# Patient Record
Sex: Female | Born: 1955 | Race: White | Hispanic: No | Marital: Married | Smoking: Never smoker
Health system: Southern US, Community
[De-identification: ages and names within clinical notes are randomized; demographics above are authoritative.]

## PROBLEM LIST (undated history)

## (undated) DIAGNOSIS — I639 Cerebral infarction, unspecified: Secondary | ICD-10-CM

## (undated) DIAGNOSIS — G43909 Migraine, unspecified, not intractable, without status migrainosus: Secondary | ICD-10-CM

## (undated) DIAGNOSIS — I10 Essential (primary) hypertension: Secondary | ICD-10-CM

## (undated) DIAGNOSIS — M797 Fibromyalgia: Secondary | ICD-10-CM

## (undated) HISTORY — DX: Cerebral infarction, unspecified: I63.9

## (undated) HISTORY — PX: ABDOMINAL HYSTERECTOMY: SHX81

## (undated) HISTORY — PX: CHOLECYSTECTOMY: SHX55

## (undated) HISTORY — PX: OTHER SURGICAL HISTORY: SHX169

## (undated) HISTORY — PX: THYROID SURGERY: SHX805

---

## 2005-09-12 ENCOUNTER — Encounter: Admission: RE | Admit: 2005-09-12 | Discharge: 2005-09-12 | Payer: Self-pay | Admitting: Internal Medicine

## 2006-04-19 ENCOUNTER — Other Ambulatory Visit: Admission: RE | Admit: 2006-04-19 | Discharge: 2006-04-19 | Payer: Self-pay | Admitting: Obstetrics and Gynecology

## 2006-09-17 ENCOUNTER — Encounter (INDEPENDENT_AMBULATORY_CARE_PROVIDER_SITE_OTHER): Payer: Self-pay | Admitting: Specialist

## 2006-09-17 ENCOUNTER — Ambulatory Visit (HOSPITAL_COMMUNITY): Admission: RE | Admit: 2006-09-17 | Discharge: 2006-09-17 | Payer: Self-pay | Admitting: *Deleted

## 2007-06-21 ENCOUNTER — Encounter: Admission: RE | Admit: 2007-06-21 | Discharge: 2007-06-21 | Payer: Self-pay | Admitting: Endocrinology

## 2008-05-07 ENCOUNTER — Encounter (INDEPENDENT_AMBULATORY_CARE_PROVIDER_SITE_OTHER): Payer: Self-pay | Admitting: *Deleted

## 2008-05-07 ENCOUNTER — Ambulatory Visit (HOSPITAL_COMMUNITY): Admission: RE | Admit: 2008-05-07 | Discharge: 2008-05-07 | Payer: Self-pay | Admitting: *Deleted

## 2009-04-09 ENCOUNTER — Encounter: Admission: RE | Admit: 2009-04-09 | Discharge: 2009-04-09 | Payer: Self-pay | Admitting: Internal Medicine

## 2009-04-10 ENCOUNTER — Emergency Department (HOSPITAL_COMMUNITY): Admission: EM | Admit: 2009-04-10 | Discharge: 2009-04-10 | Payer: Self-pay | Admitting: Emergency Medicine

## 2009-04-16 ENCOUNTER — Ambulatory Visit (HOSPITAL_COMMUNITY): Admission: RE | Admit: 2009-04-16 | Discharge: 2009-04-16 | Payer: Self-pay | Admitting: Emergency Medicine

## 2009-05-10 ENCOUNTER — Ambulatory Visit (HOSPITAL_COMMUNITY): Admission: RE | Admit: 2009-05-10 | Discharge: 2009-05-10 | Payer: Self-pay | Admitting: Gastroenterology

## 2009-06-03 ENCOUNTER — Encounter: Admission: RE | Admit: 2009-06-03 | Discharge: 2009-06-03 | Payer: Self-pay | Admitting: Surgery

## 2010-06-15 ENCOUNTER — Emergency Department (HOSPITAL_COMMUNITY): Admission: EM | Admit: 2010-06-15 | Discharge: 2010-06-15 | Payer: Self-pay | Admitting: Emergency Medicine

## 2011-01-15 ENCOUNTER — Encounter: Payer: Self-pay | Admitting: *Deleted

## 2011-01-15 ENCOUNTER — Encounter (HOSPITAL_BASED_OUTPATIENT_CLINIC_OR_DEPARTMENT_OTHER): Payer: Self-pay | Admitting: General Surgery

## 2011-01-15 ENCOUNTER — Encounter: Payer: Self-pay | Admitting: Internal Medicine

## 2011-01-18 ENCOUNTER — Emergency Department (HOSPITAL_COMMUNITY)
Admission: EM | Admit: 2011-01-18 | Discharge: 2011-01-18 | Payer: Self-pay | Source: Home / Self Care | Admitting: Emergency Medicine

## 2011-01-18 LAB — COMPREHENSIVE METABOLIC PANEL
Albumin: 3.9 g/dL (ref 3.5–5.2)
Alkaline Phosphatase: 92 U/L (ref 39–117)
BUN: 18 mg/dL (ref 6–23)
Calcium: 9.4 mg/dL (ref 8.4–10.5)
Creatinine, Ser: 0.74 mg/dL (ref 0.4–1.2)
GFR calc Af Amer: >60 mL/min (ref >60)
Total Bilirubin: 0.4 mg/dL (ref 0.3–1.2)

## 2011-01-18 LAB — CBC
HCT: 42 % (ref 36.0–46.0)
Hemoglobin: 13.8 g/dL (ref 12.0–15.0)
MCH: 29.2 pg (ref 26.0–34.0)
MCV: 88.8 fL (ref 78.0–100.0)
RBC: 4.73 MIL/uL (ref 3.87–5.11)
RDW: 12.8 % (ref 11.5–15.5)
WBC: 8.3 10*3/uL (ref 4.0–10.5)

## 2011-01-18 LAB — POCT CARDIAC MARKERS: Myoglobin, poc: 37.3 ng/mL (ref 12–200)

## 2011-03-12 LAB — COMPREHENSIVE METABOLIC PANEL
AST: 23 U/L (ref 0–37)
Albumin: 3.9 g/dL (ref 3.5–5.2)
Alkaline Phosphatase: 65 U/L (ref 39–117)
Calcium: 9.8 mg/dL (ref 8.4–10.5)
GFR calc Af Amer: >60 mL/min (ref >60)
Glucose, Bld: 113 mg/dL — ABNORMAL HIGH (ref 70–99)
Total Protein: 7.1 g/dL (ref 6.0–8.3)

## 2011-03-12 LAB — CBC
HCT: 43.3 % (ref 36.0–46.0)
Hemoglobin: 14.2 g/dL (ref 12.0–15.0)
MCH: 29.1 pg (ref 26.0–34.0)
MCHC: 32.8 g/dL (ref 30.0–36.0)
WBC: 6.8 10*3/uL (ref 4.0–10.5)

## 2011-03-12 LAB — URINALYSIS, ROUTINE W REFLEX MICROSCOPIC
Bilirubin Urine: NEGATIVE
Protein, ur: NEGATIVE mg/dL

## 2011-03-12 LAB — DIFFERENTIAL
Basophils Absolute: 0 10*3/uL (ref 0.0–0.1)
Eosinophils Absolute: 0.1 10*3/uL (ref 0.0–0.7)
Eosinophils Relative: 1 % (ref 0–5)
Lymphocytes Relative: 26 % (ref 12–46)
Lymphs Abs: 1.8 10*3/uL (ref 0.7–4.0)
Monocytes Absolute: 0.5 10*3/uL (ref 0.1–1.0)
Neutro Abs: 4.4 10*3/uL (ref 1.7–7.7)
Neutrophils Relative %: 65 % (ref 43–77)

## 2011-03-12 LAB — POCT CARDIAC MARKERS
CKMB, poc: <1 ng/mL — ABNORMAL LOW (ref 1.0–8.0)
Troponin i, poc: <0.05 ng/mL (ref 0.00–0.09)

## 2011-03-12 LAB — D-DIMER, QUANTITATIVE: D-Dimer, Quant: 0.25 ug/mL-FEU (ref 0.00–0.48)

## 2011-04-05 LAB — DIFFERENTIAL
Lymphocytes Relative: 29 % (ref 12–46)
Lymphs Abs: 1.8 10*3/uL (ref 0.7–4.0)
Monocytes Absolute: 0.4 10*3/uL (ref 0.1–1.0)
Monocytes Relative: 6 % (ref 3–12)
Neutro Abs: 4.1 10*3/uL (ref 1.7–7.7)
Neutrophils Relative %: 64 % (ref 43–77)

## 2011-04-05 LAB — COMPREHENSIVE METABOLIC PANEL
Albumin: 4 g/dL (ref 3.5–5.2)
CO2: 26 mEq/L (ref 19–32)
Calcium: 9.3 mg/dL (ref 8.4–10.5)
Chloride: 104 mEq/L (ref 96–112)
GFR calc Af Amer: >60 mL/min (ref >60)
Sodium: 138 mEq/L (ref 135–145)
Total Protein: 6.8 g/dL (ref 6.0–8.3)

## 2011-04-05 LAB — CBC
MCV: 90 fL (ref 78.0–100.0)
RBC: 4.99 MIL/uL (ref 3.87–5.11)
WBC: 6.4 10*3/uL (ref 4.0–10.5)

## 2011-05-09 NOTE — Op Note (Signed)
[name], Robin Robles               ACCOUNT NO.:  [id number]   MEDICAL RECORD NO.:  [id number]          PATIENT TYPE:  AMB   LOCATION:  ENDO                         FACILITY:  [facility]   PHYSICIAN:  Georgiana Spinner, M.D.    DATE OF BIRTH:  08-17-1956   DATE OF PROCEDURE:  DATE OF DISCHARGE:                               OPERATIVE REPORT   PROCEDURE:  Upper endoscopy with dilation and biopsy.   INDICATIONS:  Dysphagia.   ANESTHESIA:  Fentanyl 75 mcg, Versed 6 mg.   PROCEDURE:  With the patient mildly sedated in the left lateral  decubitus position the Pentax videoscopic endoscope was inserted and  passed under direct vision through the esophagus which appeared normal.  We passed through a tight lower esophageal sphincter into the stomach,  fundus, body appeared normal.  The antrum, however, showed changes of  erythema in a linear fashion which was photographed and biopsied.  Duodenal bulb, second portion of duodenum appeared normal.  From this  point the endoscope was slowly withdrawn taking circumferential views of  duodenal mucosa until the endoscope had been pulled back into stomach  and placed in retroflexion to view the stomach from below.  The  endoscope was straightened and a guidewire was passed.  The endoscope  was withdrawn.  Subsequently Savary dilators 15 were passed rather  easily under fluoroscopic control over the guide wire.  The guidewire  and the dilator were withdrawn.  The patient's vital signs, pulse  oximeter remained stable.  The patient tolerated the procedure well  without apparent complications.   FINDINGS:  Clinically tight lower esophageal sphincter dilated to 15  Savary and biopsies of antrum.   PLAN:  Await biopsy report.  The patient will call me for results and  follow-up with me as needed as an outpatient.           ______________________________  Georgiana Spinner, M.D.     GMO/MEDQ  D:  05/07/2008  T:  05/07/2008  Job:  [id number]

## 2011-05-09 NOTE — Op Note (Signed)
NAME:  Robin Robles, Robin Robles         ACCOUNT NO.:  [id number]   MEDICAL RECORD NO.:  [id number]          PATIENT TYPE:  AMB   LOCATION:  ENDO                         FACILITY:  [facility]   PHYSICIAN:  Shirley Friar, MDDATE OF BIRTH:  1956-04-08   DATE OF PROCEDURE:  05/10/2009  DATE OF DISCHARGE:  05/10/2009                               OPERATIVE REPORT   INDICATIONS:  Abdominal pain and dysphagia.   MEDICATIONS:  1. Fentanyl 75 mcg IV.  2. Versed 7.5 mg IV.  3. Phenergan 12.5 mg IV.   FINDINGS:  Endoscope was inserted through the oropharynx and esophagus  was intubated, which was normal in its entirety.  Endoscope was easily  advanced into the stomach, which revealed normal-appearing stomach  mucosa.  Retroflexion was done, which revealed normal proximal stomach.  Endoscope was straightened and advanced into the duodenal bulb and  second portion of the duodenum, which were both normal.   ASSESSMENT:  Normal upper endoscopy.   PLAN:  1. Question whether right upper quadrant pain is from gallbladder      sludge seen on previously done ultrasound.  We will refer the      patient to [facility] for possible gallbladder      surgery.  2. Low-fat diet.      Shirley Friar, MD  Electronically Signed     VCS/MEDQ  D:  05/10/2009  T:  05/11/2009  Job:  [id number]   cc:   Georgianne Fick, M.D.

## 2011-05-12 NOTE — Op Note (Signed)
[name], [name]               ACCOUNT NO.:  [id number]   MEDICAL RECORD NO.:  [id number]          PATIENT TYPE:  AMB   LOCATION:  ENDO                         FACILITY:  [facility]   PHYSICIAN:  Georgiana Spinner, M.D.    DATE OF BIRTH:  1956/07/25   DATE OF PROCEDURE:  09/17/2006  DATE OF DISCHARGE:                                 OPERATIVE REPORT   PROCEDURE:  Colonoscopy.   INDICATIONS:  Diarrhea.   ANESTHESIA:  Demerol 50 mg, Versed 6 mg.   PROCEDURE:  With the patient mildly sedated in the left lateral decubitus  position, a rectal examination was performed which revealed trace positive  material.  Subsequently, the Olympus videoscopic colonoscope was inserted  into the rectum, passed under direct vision to the cecum identified by the  ileocecal valve and appendiceal orifice, both which were photographed.  We  entered into the terminal ileum which appeared normal.  From this point, the  colonoscope was then slowly withdrawn all the way to the rectum, taking  circumferential views of the colonic mucosa, stopping to take random  biopsies from normal appearing mucosa as we withdrew. The rectum appeared  normal on direct and retroflexed view. The endoscope was straightened and  withdrawn.  The patient's vital signs and pulse oximeter remained stable.  The patient tolerated the procedure well without apparent complications.   FINDINGS:  Normal examination with biopsies taken, await biopsy report.  The  patient will call me for results and follow-up with me as an outpatient.           ______________________________  Georgiana Spinner, M.D.     GMO/MEDQ  D:  09/17/2006  T:  09/18/2006  Job:  [id number]

## 2011-10-12 ENCOUNTER — Other Ambulatory Visit: Payer: Self-pay | Admitting: Otolaryngology

## 2013-09-20 ENCOUNTER — Other Ambulatory Visit: Payer: Self-pay

## 2013-09-20 ENCOUNTER — Encounter (HOSPITAL_COMMUNITY): Payer: Self-pay

## 2013-09-20 ENCOUNTER — Emergency Department (HOSPITAL_COMMUNITY): Payer: BC Managed Care – PPO

## 2013-09-20 ENCOUNTER — Emergency Department (HOSPITAL_COMMUNITY)
Admission: EM | Admit: 2013-09-20 | Discharge: 2013-09-20 | Disposition: A | Discharge status: Home / Self Care | Payer: BC Managed Care – PPO | Attending: Emergency Medicine | Admitting: Emergency Medicine

## 2013-09-20 DIAGNOSIS — I10 Essential (primary) hypertension: Secondary | ICD-10-CM | POA: Insufficient documentation

## 2013-09-20 DIAGNOSIS — R252 Cramp and spasm: Secondary | ICD-10-CM | POA: Insufficient documentation

## 2013-09-20 DIAGNOSIS — M436 Torticollis: Secondary | ICD-10-CM | POA: Insufficient documentation

## 2013-09-20 HISTORY — DX: Essential (primary) hypertension: I10

## 2013-09-20 HISTORY — DX: Fibromyalgia: M79.7

## 2013-09-20 LAB — BASIC METABOLIC PANEL
BUN: 22 mg/dL (ref 6–23)
CO2: 25 mEq/L (ref 19–32)
Calcium: 9.5 mg/dL (ref 8.4–10.5)
Glucose, Bld: 111 mg/dL — ABNORMAL HIGH (ref 70–99)
Sodium: 139 mEq/L (ref 135–145)

## 2013-09-20 LAB — CBC WITH DIFFERENTIAL/PLATELET
Eosinophils Relative: 2 % (ref 0–5)
HCT: 41.1 % (ref 36.0–46.0)
Hemoglobin: 13.8 g/dL (ref 12.0–15.0)
Lymphocytes Relative: 28 % (ref 12–46)
Lymphs Abs: 2 10*3/uL (ref 0.7–4.0)
MCH: 29.8 pg (ref 26.0–34.0)
MCV: 88.8 fL (ref 78.0–100.0)
Monocytes Relative: 6 % (ref 3–12)
Platelets: 259 10*3/uL (ref 150–400)
RBC: 4.63 MIL/uL (ref 3.87–5.11)
WBC: 7 10*3/uL (ref 4.0–10.5)

## 2013-09-20 LAB — POCT I-STAT TROPONIN I

## 2013-09-20 MED ORDER — KETOROLAC TROMETHAMINE 30 MG/ML IJ SOLN
30.0000 mg | Freq: Once | INTRAMUSCULAR | Status: AC
  Administered 2013-09-20: 30 mg via INTRAVENOUS
  Filled 2013-09-20: qty 1

## 2013-09-20 MED ORDER — DIAZEPAM 5 MG PO TABS
5.0000 mg | ORAL_TABLET | Freq: Once | ORAL | Status: AC
  Administered 2013-09-20: 5 mg via ORAL
  Filled 2013-09-20: qty 1

## 2013-09-20 MED ORDER — METHOCARBAMOL 500 MG PO TABS: 500.0000 mg | ORAL_TABLET | Freq: Two times a day (BID) | ORAL | Status: DC

## 2013-09-20 MED ORDER — HYDROCODONE-ACETAMINOPHEN 5-325 MG PO TABS: 1.0000 | ORAL_TABLET | Freq: Four times a day (QID) | ORAL | Status: DC | PRN

## 2013-09-20 NOTE — ED Notes (Addendum)
Pt c/o pain to LT arm when she was working this am.  The pain radiated to neck and upper back.  States she has fibromyalgia and thought it was d/t that but it has gotten worse.  The pain increases w/movement of the arm.  Denies trauma or lifting anything heavy.  Denies shob, n/v/d, fevers, or weakness otherwise.  C/O "just a little bit of chest pain"

## 2013-09-20 NOTE — ED Provider Notes (Signed)
CSN: [id number]     Arrival date & time 09/20/13  1440 History   First MD Initiated Contact with Patient 09/20/13 1513     Chief Complaint  Patient presents with  . Arm Pain  . Torticollis   (Consider location/radiation/quality/duration/timing/severity/associated sxs/prior Treatment) HPI Comments: Patient presents to the ED with a chief complaint of left arm pain.  Patient states that the pain began suddenly this afternoon. She denies doing anything that could have caused her symptoms, such as heavy lifting or exercising.  The pain is worsened when she moves her arm.  She states that she does have some radiating symptoms to her neck and back.  Patient states that she has a history of fibromyalgia, but she states that this feels different.  Cardiac risk factors include hypertension and family history.  She states that she normally takes ibuprofen and gabapentin.  She denies any associated SOB or diaphoresis.  Denies, nausea, vomiting, diarrhea, or constipation.  The history is provided by the patient. No language interpreter was used.    Past Medical History  Diagnosis Date  . Hypertension   . Fibromyalgia    Past Surgical History  Procedure Laterality Date  . Abdominal hysterectomy    . Thyroid surgery    . Cholecystectomy     No family history on file. History  Substance Use Topics  . Smoking status: Never Smoker   . Smokeless tobacco: Not on file  . Alcohol Use: No   OB History   Grav Para Term Preterm Abortions TAB SAB Ect Mult Living                 Review of Systems  All other systems reviewed and are negative.    Allergies  Codeine  Home Medications  No current outpatient prescriptions on file. BP 131/78  Pulse 89  Temp(Src) 98.6 F (37 C) (Oral)  Resp 16  SpO2 93% Physical Exam  Nursing note and vitals reviewed. Constitutional: She is oriented to person, place, and time. She appears well-developed and well-nourished.  HENT:  Head: Normocephalic and  atraumatic.  Eyes: Conjunctivae and EOM are normal. Pupils are equal, round, and reactive to light.  Neck: Normal range of motion. Neck supple.  Cardiovascular: Normal rate, regular rhythm and normal heart sounds.  Exam reveals no gallop and no friction rub.   No murmur heard. Chest is non-tender to palpation  Pulmonary/Chest: Effort normal and breath sounds normal. No respiratory distress. She has no wheezes. She has no rales. She exhibits no tenderness.  Abdominal: Soft. Bowel sounds are normal. She exhibits no distension and no mass. There is no tenderness. There is no rebound and no guarding.  Musculoskeletal: Normal range of motion. She exhibits no edema and no tenderness.  Left arm is tender palpation, no gross abnormality or deformity, ROM and strength is 5/5,  Neurological: She is alert and oriented to person, place, and time.  Sensation and strength intact  Skin: Skin is warm and dry.  Psychiatric: She has a normal mood and affect. Her behavior is normal. Judgment and thought content normal.    ED Course  Procedures (including critical care time) Labs Review Labs Reviewed  CBC WITH DIFFERENTIAL  BASIC METABOLIC PANEL  ED ECG REPORT  I personally interpreted this EKG   Date: 09/20/2013   Rate: 87  Rhythm: normal sinus rhythm  QRS Axis: normal  Intervals: normal  ST/T Wave abnormalities: normal  Conduction Disutrbances:none  Narrative Interpretation:   Old EKG Reviewed: none available  Results for orders placed during the hospital encounter of 09/20/13  CBC WITH DIFFERENTIAL      Result Value Range   WBC 7.0  4.0 - 10.5 K/uL   RBC 4.63  3.87 - 5.11 MIL/uL   Hemoglobin 13.8  12.0 - 15.0 g/dL   HCT 16.1  09.6 - 04.5 %   MCV 88.8  78.0 - 100.0 fL   MCH 29.8  26.0 - 34.0 pg   MCHC 33.6  30.0 - 36.0 g/dL   RDW 40.9  81.1 - 91.4 %   Platelets 259  150 - 400 K/uL   Neutrophils Relative % 63  43 - 77 %   Neutro Abs 4.4  1.7 - 7.7 K/uL   Lymphocytes Relative 28  12 - 46  %   Lymphs Abs 2.0  0.7 - 4.0 K/uL   Monocytes Relative 6  3 - 12 %   Monocytes Absolute 0.4  0.1 - 1.0 K/uL   Eosinophils Relative 2  0 - 5 %   Eosinophils Absolute 0.2  0.0 - 0.7 K/uL   Basophils Relative 0  0 - 1 %   Basophils Absolute 0.0  0.0 - 0.1 K/uL  BASIC METABOLIC PANEL      Result Value Range   Sodium 139  135 - 145 mEq/L   Potassium 4.0  3.5 - 5.1 mEq/L   Chloride 103  96 - 112 mEq/L   CO2 25  19 - 32 mEq/L   Glucose, Bld 111 (*) 70 - 99 mg/dL   BUN 22  6 - 23 mg/dL   Creatinine, Ser 7.82  0.50 - 1.10 mg/dL   Calcium 9.5  8.4 - 95.6 mg/dL   GFR calc non Af Amer >90  >90 mL/min   GFR calc Af Amer >90  >90 mL/min  POCT I-STAT TROPONIN I      Result Value Range   Troponin i, poc 0.00  0.00 - 0.08 ng/mL   Comment 3            Dg Chest 2 View  09/20/2013   CLINICAL DATA:  Initial encounter for mild chest pain and left upper extremity pain with acute onset earlier today. Current history of fibromyalgia and hypertension.  EXAM: CHEST  2 VIEW  COMPARISON:  Portable chest x-ray 01/18/2011. Two-view chest x-ray 06/03/2009.  FINDINGS: Cardiomediastinal silhouette unremarkable and unchanged. Scarring in the left lower lobe, lingula, and right middle lobe, unchanged. Stable mild chronic elevation of the right hemidiaphragm. No new pulmonary parenchymal abnormalities. No pneumothorax. No pleural effusions. Visualized bony thorax intact.  IMPRESSION: No acute cardiopulmonary disease. Stable scarring in the left lower lobe, lingula, and right middle lobe.   Electronically Signed   By: Hulan Saas   On: 09/20/2013 16:12     MDM   1. Muscle cramps     Patient with left arm pain. Suspect that his fibromyalgia related. Pain is worsened with movement of the left arm. It is reproducible with palpation. History of fibromyalgia. It radiates to her left arm. He she states that she has had "a little bit of chest pain," but no shortness of breath or diaphoresis. No exertional component to  the chest pain. I doubt for ACS, heart score is 2, indicating that low risk of MACE.  EKG is normal, troponin is negative. Doubt PE, no SOB.  Patient discussed with Dr. Jeraldine Loots, who agrees with the plan.    Roxy Horseman, PA-C 09/20/13 1739

## 2013-09-20 NOTE — ED Provider Notes (Signed)
  Medical screening examination/treatment/procedure(s) were performed by non-physician practitioner and as supervising physician I was immediately available for consultation/collaboration.    Breslin Burklow, MD 09/20/13 2323 

## 2016-11-20 ENCOUNTER — Emergency Department (HOSPITAL_COMMUNITY)
Admission: EM | Admit: 2016-11-20 | Discharge: 2016-11-20 | Disposition: A | Discharge status: Home / Self Care | Payer: BLUE CROSS/BLUE SHIELD | Attending: Emergency Medicine | Admitting: Emergency Medicine

## 2016-11-20 ENCOUNTER — Emergency Department (HOSPITAL_COMMUNITY): Payer: BLUE CROSS/BLUE SHIELD

## 2016-11-20 ENCOUNTER — Encounter (HOSPITAL_COMMUNITY): Payer: Self-pay | Admitting: *Deleted

## 2016-11-20 DIAGNOSIS — R0789 Other chest pain: Secondary | ICD-10-CM | POA: Diagnosis not present

## 2016-11-20 DIAGNOSIS — M542 Cervicalgia: Secondary | ICD-10-CM

## 2016-11-20 DIAGNOSIS — I10 Essential (primary) hypertension: Secondary | ICD-10-CM | POA: Diagnosis not present

## 2016-11-20 DIAGNOSIS — M4802 Spinal stenosis, cervical region: Secondary | ICD-10-CM | POA: Insufficient documentation

## 2016-11-20 LAB — BASIC METABOLIC PANEL
Anion gap: 8 (ref 5–15)
BUN: 17 mg/dL (ref 6–20)
CALCIUM: 9.6 mg/dL (ref 8.9–10.3)
CHLORIDE: 108 mmol/L (ref 101–111)
CO2: 23 mmol/L (ref 22–32)
CREATININE: 0.57 mg/dL (ref 0.44–1.00)
GFR calc non Af Amer: >60 mL/min (ref >60)
Glucose, Bld: 92 mg/dL (ref 65–99)
Potassium: 3.9 mmol/L (ref 3.5–5.1)
SODIUM: 139 mmol/L (ref 135–145)

## 2016-11-20 LAB — CBC
HCT: 42.2 % (ref 36.0–46.0)
HEMOGLOBIN: 14.1 g/dL (ref 12.0–15.0)
MCH: 29.9 pg (ref 26.0–34.0)
MCHC: 33.4 g/dL (ref 30.0–36.0)
MCV: 89.4 fL (ref 78.0–100.0)
Platelets: 225 10*3/uL (ref 150–400)
RBC: 4.72 MIL/uL (ref 3.87–5.11)
RDW: 13.4 % (ref 11.5–15.5)
WBC: 6.7 10*3/uL (ref 4.0–10.5)

## 2016-11-20 LAB — I-STAT TROPONIN, ED
TROPONIN I, POC: 0 ng/mL (ref 0.00–0.08)
Troponin i, poc: 0 ng/mL (ref 0.00–0.08)

## 2016-11-20 MED ORDER — ONDANSETRON HCL 4 MG/2ML IJ SOLN
4.0000 mg | Freq: Once | INTRAMUSCULAR | Status: AC
  Administered 2016-11-20: 4 mg via INTRAVENOUS
  Filled 2016-11-20: qty 2

## 2016-11-20 MED ORDER — FAMOTIDINE 20 MG PO TABS
20.0000 mg | ORAL_TABLET | Freq: Once | ORAL | Status: AC
  Administered 2016-11-20: 20 mg via ORAL
  Filled 2016-11-20: qty 1

## 2016-11-20 MED ORDER — CYCLOBENZAPRINE HCL 10 MG PO TABS: 10.0000 mg | ORAL_TABLET | Freq: Three times a day (TID) | ORAL | 0 refills | Status: DC | PRN

## 2016-11-20 MED ORDER — IOPAMIDOL (ISOVUE-370) INJECTION 76%
INTRAVENOUS | Status: AC
  Administered 2016-11-20: 50 mL
  Filled 2016-11-20: qty 50

## 2016-11-20 MED ORDER — GI COCKTAIL ~~LOC~~
30.0000 mL | Freq: Once | ORAL | Status: AC
  Administered 2016-11-20: 30 mL via ORAL
  Filled 2016-11-20: qty 30

## 2016-11-20 MED ORDER — OMEPRAZOLE 20 MG PO CPDR: 20.0000 mg | DELAYED_RELEASE_CAPSULE | Freq: Every day | ORAL | 0 refills | Status: DC

## 2016-11-20 MED ORDER — MORPHINE SULFATE (PF) 4 MG/ML IV SOLN
4.0000 mg | Freq: Once | INTRAVENOUS | Status: AC
  Administered 2016-11-20: 4 mg via INTRAVENOUS
  Filled 2016-11-20: qty 1

## 2016-11-20 MED ORDER — GI COCKTAIL ~~LOC~~: 30.0000 mL | Freq: Two times a day (BID) | ORAL | 0 refills | Status: DC | PRN

## 2016-11-20 NOTE — Discharge Instructions (Signed)
Read the information below.  Use the prescribed medication as directed.  Please discuss all new medications with your pharmacist.  You may return to the Emergency Department at any time for worsening condition or any new symptoms that concern you.   If you develop worsening chest pain, shortness of breath, fever, you pass out, or become weak or dizzy, return to the ER for a recheck.    °

## 2016-11-20 NOTE — ED Triage Notes (Signed)
Pt is here with severe pain in her neck that radiates down to right arm and into back.  Pt states pain is now into mid chest and through to back and left shoulder. PT states she has history of arrythmia.  Dr Nadara EatonGangi. NO shortness of breath

## 2016-11-20 NOTE — ED Provider Notes (Signed)
[facility]-EMERGENCY DEPT Provider Note   CSN: [id number] Arrival date & time: 11/20/16  1048     History   Chief Complaint Chief Complaint  Patient presents with  . Chest Pain    HPI Robin Robles is a 60 y.o. female.  HPI   Pt with hx HTN, fibromyalgias p/w chest pain and back pain that began last night.  States she initially developed pain in the back of her neck that radiated down the right arm.  She took two aleve, then two more aleve a few hours later, then two doses of lyrica.  She had associated nausea from the pain.  This morning around 9am she developed pain in the center of her chest and middle back, slightly over the left chest that waxes and wanes, described as squeezing and burning.  No exacerbating or palliative factors.  No further nausea.  Denies SOB, cough, vomiting, abdominal pain.  No recent immobilization, exogenous estrogen, leg swelling, history of blood clot.  Does have family hx CAD : Father had MI at 5669.    Has seen Dr Nadara EatonGangi before in 2012 for hypotension and syncope thought to be caused by her Cymbalta - this medication was changed.  She had negative treadmill stress test at the time.    Typical fibromyalgia pain is in her hands and teeth - she does not have any of this pain currently.   Past Medical History:  Diagnosis Date  . Fibromyalgia   . Hypertension     There are no active problems to display for this patient.   Past Surgical History:  Procedure Laterality Date  . ABDOMINAL HYSTERECTOMY    . CHOLECYSTECTOMY    . THYROID SURGERY      OB History    No data available       Home Medications    Prior to Admission medications   Medication Sig Start Date End Date Taking? Authorizing Provider  naproxen sodium (ANAPROX) 220 MG tablet Take 220 mg by mouth daily as needed. For pain   Yes Historical Provider, MD  pregabalin (LYRICA) 50 MG capsule Take 50 mg by mouth daily.   Yes Historical Provider, MD  propranolol (INDERAL) 40 MG tablet  Take 40 mg by mouth every evening.   Yes Historical Provider, MD  sertraline (ZOLOFT) 100 MG tablet Take 200 mg by mouth every morning.    Yes Historical Provider, MD  topiramate (TOPAMAX) 25 MG tablet Take 75 mg by mouth at bedtime.   Yes Historical Provider, MD  Alum & Mag Hydroxide-Simeth (GI COCKTAIL) SUSP suspension Take 30 mLs by mouth 2 (two) times daily as needed for indigestion. Shake well. 11/20/16   Trixie DredgeEmily Joanmarie Tsang, PA-C  cyclobenzaprine (FLEXERIL) 10 MG tablet Take 1 tablet (10 mg total) by mouth 3 (three) times daily as needed for muscle spasms (or pain). 11/20/16   Trixie DredgeEmily Mykaela Arena, PA-C  omeprazole (PRILOSEC) 20 MG capsule Take 1 capsule (20 mg total) by mouth daily. 11/20/16   Trixie DredgeEmily Roselia Snipe, PA-C    Family History No family history on file.  Social History Social History  Substance Use Topics  . Smoking status: Never Smoker  . Smokeless tobacco: Never Used  . Alcohol use No     Allergies   Bee venom and Codeine   Review of Systems Review of Systems  All other systems reviewed and are negative.    Physical Exam Updated Vital Signs BP 112/73 (BP Location: Right Arm) Comment: Simultaneous filing. User may not have seen previous data.  Pulse 73 Comment: Simultaneous filing. User may not have seen previous data.  Temp 97.8 F (36.6 C) (Oral)   Resp 20 Comment: Simultaneous filing. User may not have seen previous data.  SpO2 96% Comment: Simultaneous filing. User may not have seen previous data.  Physical Exam  Constitutional: She appears well-developed and well-nourished. No distress.  HENT:  Head: Normocephalic and atraumatic.  Neck: Neck supple.  Cardiovascular: Normal rate and regular rhythm.   Pulmonary/Chest: Effort normal and breath sounds normal. No respiratory distress. She has no wheezes. She has no rales.  Abdominal: Soft. She exhibits no distension. There is no tenderness. There is no rebound and no guarding.  Musculoskeletal: She exhibits no edema.    Neurological: She is alert.  Skin: She is not diaphoretic.  Nursing note and vitals reviewed.    ED Treatments / Results  Labs (all labs ordered are listed, but only abnormal results are displayed) Labs Reviewed  BASIC METABOLIC PANEL  CBC  I-STAT TROPOININ, ED  I-STAT TROPOININ, ED    EKG  EKG Interpretation  Date/Time:  Monday November 20 2016 10:55:04 EST Ventricular Rate:  66 PR Interval:  130 QRS Duration: 82 QT Interval:  382 QTC Calculation: 400 R Axis:   91 Text Interpretation:  Normal sinus rhythm Rightward axis Borderline ECG Confirmed by [facility] MD, JASON [id number]) on 11/20/2016 12:23:56 PM       Radiology Dg Chest 2 View  Result Date: 11/20/2016 CLINICAL DATA:  Chest pain EXAM: CHEST  2 VIEW COMPARISON:  September 20, 2013 FINDINGS: There is mild bibasilar atelectasis. Lungs elsewhere are clear. Heart size and pulmonary vascularity are normal. No adenopathy. No bone lesions. No pneumothorax. IMPRESSION: Mild bibasilar atelectasis.  No edema or consolidation. Electronically Signed   By: Bretta Bang III M.D.   On: 11/20/2016 12:07   Ct Angio Neck W And/or Wo Contrast  Result Date: 11/20/2016 CLINICAL DATA:  Pain radiating from RIGHT neck into RIGHT arm beginning yesterday. History of hypertension. EXAM: CT ANGIOGRAPHY NECK TECHNIQUE: Multidetector CT imaging of the neck was performed using the standard protocol during bolus administration of intravenous contrast. Multiplanar CT image reconstructions and MIPs were obtained to evaluate the vascular anatomy. Carotid stenosis measurements (when applicable) are obtained utilizing NASCET criteria, using the distal internal carotid diameter as the denominator. CONTRAST:  50 cc Isovue 370 COMPARISON:  CT HEAD September 13, 2015 FINDINGS: AORTIC ARCH: Normal appearance of the thoracic arch, normal branch pattern. Trace calcific atherosclerosis of the aortic arch. The origins of the innominate, left Common carotid artery  and subclavian artery are widely patent. RIGHT CAROTID SYSTEM: Common carotid artery is widely patent, coursing in a straight line fashion. Normal appearance of the carotid bifurcation without hemodynamically significant stenosis by NASCET criteria. Normal appearance of the included internal carotid artery. LEFT CAROTID SYSTEM: Common carotid artery is widely patent, coursing in a straight line fashion. Normal appearance of the carotid bifurcation without hemodynamically significant stenosis by NASCET criteria. Mild intimal thickening LEFT internal carotid artery origin. Normal appearance of the included internal carotid artery. VERTEBRAL ARTERIES:Left vertebral artery is dominant. Normal appearance of the vertebral arteries, which appear widely patent. SKELETON: No acute osseous process though bone windows have not been submitted. Old C7 mild burst fracture results in moderate canal stenosis. Uncovertebral hypertrophy results in moderate bilateral C5-6 and mild C6-7 neural foraminal narrowing. OTHER NECK: Soft tissues of the neck are nonacute though, not tailored for evaluation. Bronchial wall thickening and heterogeneous lung attenuation within included lung apices.  Calcified RIGHT hilar lymph nodes versus venous contamination. Status post LEFT thyroidectomy. IMPRESSION: No acute vascular process or hemodynamically significant stenosis. Old C7 burst fracture results in mild canal stenosis. Moderate C5-6 and mild C6-7 neural foraminal narrowing. Biapical small airway disease, partially characterized. Electronically Signed   By: Awilda Metroourtnay  Bloomer M.D.   On: 11/20/2016 14:42    Procedures Procedures (including critical care time)  Medications Ordered in ED Medications  morphine 4 MG/ML injection 4 mg (4 mg Intravenous Given 11/20/16 1145)  ondansetron (ZOFRAN) injection 4 mg (4 mg Intravenous Given 11/20/16 1143)  iopamidol (ISOVUE-370) 76 % injection (50 mLs  Contrast Given 11/20/16 1359)  gi cocktail  (Maalox,Lidocaine,Donnatal) (30 mLs Oral Given 11/20/16 1536)  famotidine (PEPCID) tablet 20 mg (20 mg Oral Given 11/20/16 1536)     Initial Impression / Assessment and Plan / ED Course  I have reviewed the triage vital signs and the nursing notes.  Pertinent labs & imaging results that were available during my care of the patient were reviewed by me and considered in my medical decision making (see chart for details).  Clinical Course as of Nov 20 1638  [facility]  1549 Pt reports easing of chest pain with GI cocktail   [EW]    Clinical Course User Index [EW] Trixie DredgeEmily Demiah Gullickson, PA-C    Afebrile, nontoxic patient with right neck pain last night that radiated into right arm.  No neurologic deficits.  Pt took double dose of NSAIDs and the next day developed central chest pain.   HEART score is 2.  Wells' Criteria for PE is zero. Pain improved with GI cocktail.  Suspect gastritic irritation related to NSAIDs. Troponin x 2 negative.  CXR negative.  EKG nonischemic. Labs otherwise reassuring.  Doubt ACS, PE, aortic dissection.  CT neck demonstrated stenosis related to old burst fracture, may be related to patient's pain last night.   D/C home with GI cocktail, prilosec, flexeril, PCP, neurosurgery (patient's request, she has requested Dr Newell CoralNudelman as he has taken care of her husband).  Discussed result, findings, treatment, and follow up  with patient.  Pt given return precautions.  Pt verbalizes understanding and agrees with plan.         Final Clinical Impressions(s) / ED Diagnoses   Final diagnoses:  Atypical chest pain  Neck pain on right side  Cervical stenosis of spine    New Prescriptions Discharge Medication List as of 11/20/2016  3:53 PM    START taking these medications   Details  Alum & Mag Hydroxide-Simeth (GI COCKTAIL) SUSP suspension Take 30 mLs by mouth 2 (two) times daily as needed for indigestion. Shake well., Starting Mon 11/20/2016, Print    cyclobenzaprine (FLEXERIL)  10 MG tablet Take 1 tablet (10 mg total) by mouth 3 (three) times daily as needed for muscle spasms (or pain)., Starting Mon 11/20/2016, Print    omeprazole (PRILOSEC) 20 MG capsule Take 1 capsule (20 mg total) by mouth daily., Starting Mon 11/20/2016, Print         Elyjah Hazan BurkeEmily Nevena Rozenberg, [place] JerseyPA-C 11/20/16 1644    Marily MemosJason Mesner, MD 11/20/16 1901

## 2016-11-20 NOTE — ED Notes (Signed)
Getting patient ready for discharge took pt saline lock out

## 2016-11-20 NOTE — ED Notes (Signed)
Pt stable, ambulatory, states understanding of discharge instructions 

## 2016-11-22 ENCOUNTER — Telehealth: Payer: Self-pay | Admitting: *Deleted

## 2016-11-22 NOTE — Telephone Encounter (Signed)
Pharmacy called related to Rx: Alum & Mag Hydroxide-Simeth (GI COCKTAIL) SUSP suspension and donnatal compound not being available in community .Marland Kitchen.Marland Kitchen.EDCM clarified with EDP Laveda Norman(Tran) to change Rx to: zantac 150 mg PO BID x 10 days #20.

## 2017-07-31 ENCOUNTER — Other Ambulatory Visit (INDEPENDENT_AMBULATORY_CARE_PROVIDER_SITE_OTHER): Payer: BLUE CROSS/BLUE SHIELD

## 2017-07-31 ENCOUNTER — Ambulatory Visit (INDEPENDENT_AMBULATORY_CARE_PROVIDER_SITE_OTHER): Payer: BLUE CROSS/BLUE SHIELD | Admitting: Internal Medicine

## 2017-07-31 ENCOUNTER — Encounter: Payer: Self-pay | Admitting: Internal Medicine

## 2017-07-31 VITALS — BP 132/80 | HR 67 | Ht 62.75 in | Wt 162.0 lb

## 2017-07-31 DIAGNOSIS — R05 Cough: Secondary | ICD-10-CM

## 2017-07-31 DIAGNOSIS — R058 Other specified cough: Secondary | ICD-10-CM

## 2017-07-31 LAB — CBC WITH DIFFERENTIAL/PLATELET
BASOS PCT: 0.7 % (ref 0.0–3.0)
Basophils Absolute: 0 10*3/uL (ref 0.0–0.1)
EOS ABS: 0.1 10*3/uL (ref 0.0–0.7)
EOS PCT: 1.3 % (ref 0.0–5.0)
HEMATOCRIT: 42.9 % (ref 36.0–46.0)
Hemoglobin: 13.9 g/dL (ref 12.0–15.0)
LYMPHS PCT: 32.6 % (ref 12.0–46.0)
Lymphs Abs: 1.9 10*3/uL (ref 0.7–4.0)
MCHC: 32.4 g/dL (ref 30.0–36.0)
MCV: 90.7 fl (ref 78.0–100.0)
MONOS PCT: 6.1 % (ref 3.0–12.0)
Monocytes Absolute: 0.3 10*3/uL (ref 0.1–1.0)
NEUTROS ABS: 3.4 10*3/uL (ref 1.4–7.7)
Neutrophils Relative %: 59.3 % (ref 43.0–77.0)
PLATELETS: 225 10*3/uL (ref 150.0–400.0)
RBC: 4.73 Mil/uL (ref 3.87–5.11)
RDW: 13.7 % (ref 11.5–15.5)
WBC: 5.7 10*3/uL (ref 4.0–10.5)

## 2017-07-31 MED ORDER — FAMOTIDINE 20 MG PO TABS: ORAL_TABLET | ORAL | 2 refills | Status: DC

## 2017-07-31 MED ORDER — TRAMADOL HCL 50 MG PO TABS: ORAL_TABLET | ORAL | 0 refills | Status: DC

## 2017-07-31 MED ORDER — PANTOPRAZOLE SODIUM 40 MG PO TBEC: 40.0000 mg | DELAYED_RELEASE_TABLET | Freq: Every day | ORAL | 2 refills | Status: DC

## 2017-07-31 NOTE — Patient Instructions (Addendum)
Please see patient coordinator before you leave today  to schedule sinus CT   Please remember to go to the lab department downstairs in the basement  for your tests - we will call you with the results when they are available.      The key to effective treatment for your cough is eliminating the non-stop cycle of cough you're stuck in long enough to let your airway heal completely and then see if there is anything still making you cough once you stop the cough suppression, but this should take no more than 5 days to figure out  First take delsym two tsp every 12 hours and supplement if needed with  tramadol 50 mg up to 2 every 4 hours to suppress the urge to cough at all or even clear your throat. Swallowing water or using ice chips/non mint and menthol containing candies (such as lifesavers or sugarless jolly ranchers) are also effective.  You should rest your voice and avoid activities that you know make you cough.  Once you have eliminated the cough for 3 straight days try reducing the tramadol first,  then the delsym as tolerated.     Protonix (pantoprazole) Take 30-60 min before first meal of the day and Pepcid 20 mg one bedtime plus Chlorpheniramine 4 mg x 2 at bedtime (both available over the counter)  until cough is completely gone for at least a week without the need for cough suppression  GERD (REFLUX)  is an extremely common cause of respiratory symptoms, many times with no significant heartburn at all.    It can be treated with medication, but also with lifestyle changes including avoidance of late meals, excessive alcohol, smoking cessation, and avoid fatty foods, chocolate, peppermint, colas, red wine, and acidic juices such as orange juice.  NO MINT OR MENTHOL PRODUCTS SO NO COUGH DROPS  USE HARD CANDY INSTEAD (jolley ranchers or Stover's or Lifesavers (all available in sugarless versions) NO OIL BASED VITAMINS - use powdered substitutes.   If not completely better by 2 weeks  please return with all your active medications and we'll regroup and try another approach

## 2017-07-31 NOTE — Progress Notes (Signed)
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 pna as baby / lots of strep infections as child later in her 68's developed episodic sinus infections assoc with some mild coughing  sinus surgery 2012 by [facility] > episodes typically once a year and maint flonase then onset in Feb 2018 not assoc with obvious sinus problem but flu-like with severe cough > L post CP > cxr 02/13/17 nl rx with tamiflu > levaquin / prednisone / tessilon no better so self referred to pulmonary clinic 07/31/2017    07/31/2017 1st [facility] office visit/ [facility]  On inderal  Chief Complaint  Patient presents with  . Advice Only    Pt is a self referral due to having a cough which turned into bronchitis as well as walking pneumonia that has not improved. Pt has had prod. cough with clear to yellow and at times brown mucus x6 months. Pt has been on the Z Pak, prednisone, and levaquin. Along with the prod. cough, pt also becomes SOB. Denies any CP.  onset acute with uri no better with abx and prednisone  Cp resolved completely  Seems worse in am / also can cough to vomiting at hs assoc with sense globus    Kouffman Reflux v Neurogenic Cough Differentiator Reflux Comments  Do you awaken from a sound sleep coughing violently?                            With trouble breathing? Once a week yes   Do you have choking episodes when you cannot  Get enough air, gasping for air ?              Yes   Do you usually cough when you lie down into  The bed, or when you just lie down to rest ?                          Yes   Do you usually cough after meals or eating?         Yes if cold   Bend over no   GERD SCORE     Kouffman Reflux v Neurogenic Cough Differentiator Neurogenic   Do you more-or-less cough all day long? All day    Does change of temperature make you cough? no   Does laughing or chuckling cause you to cough? no   Do fumes  (perfume, automobile fumes, burned  Toast, etc.,) cause you to cough ?      perfume   Does speaking, singing, or talking on the phone cause you to cough   ?               No    Neurogenic/Airway score      If not coughing she's not sob   No obvious other patterns day to day or daytime variability or assoc   mucus plugs or hemoptysis or cp or chest tightness, subjective wheeze or overt sinus or hb symptoms. No unusual exp hx or h/o childhood pna/ asthma or knowledge of premature birth.   Also denies any obvious fluctuation of symptoms with weather or environmental changes or other aggravating or alleviating factors except as outlined above.  Current Medications, Allergies, Complete Past Medical History, Past Surgical History, Family History, and Social History were  reviewed in [facility] electronic medical record.  ROS  The following are not active complaints unless bolded sore throat, dysphagia, dental problems, itching, sneezing,  nasal congestion or excess/ purulent secretions, ear ache,   fever, chills, sweats, unintended wt loss, classically pleuritic or exertional cp,  orthopnea pnd or leg swelling, presyncope, palpitations, abdominal pain, anorexia, nausea, vomiting, diarrhea  or change in bowel or bladder habits, change in stools or urine, dysuria,hematuria,  rash, arthralgias, visual complaints, headache, numbness, weakness or ataxia or problems with walking or coordination,  change in mood/affect or memory.           Review of Systems     Objective:   Physical Exam    amb pleasant wf nad with harsh barking quality cough    Wt Readings from Last 3 Encounters:  07/31/17 162 lb (73.5 kg)    Vital signs reviewed - Note on arrival 02 sats  95% on RA     HEENT: nl dentition, turbinates bilaterally, and oropharynx which is pristine. Nl external ear canals without cough reflex   NECK :  without JVD/Nodes/TM/ nl carotid upstrokes bilaterally   LUNGS: no acc muscle use,   Nl contour chest which is clear to A and P bilaterally with   cough early on insp  CV:  RRR  no s3 or murmur or increase in P2, and no edema   ABD:  soft and nontender with nl inspiratory excursion in the supine position. No bruits or organomegaly appreciated, bowel sounds nl  MS:  Nl gait/ ext warm without deformities, calf tenderness, cyanosis or clubbing No obvious joint restrictions   SKIN: warm and dry without lesions    NEURO:  alert, approp, nl sensorium with  no motor or cerebellar deficits apparent.    Labs ordered 07/31/2017  Sinus CT/ allergy profile     Assessment:

## 2017-07-31 NOTE — Assessment & Plan Note (Addendum)
Onset with uri Feb 2018  - Allergy profile 07/31/2017 >  Eos 0. /  IgE   - Sinus CT 07/31/2017 >>>    The most common causes of chronic cough in immunocompetent adults include the following: upper airway cough syndrome (UACS), previously referred to as postnasal drip syndrome (PNDS), which is caused by variety of rhinosinus conditions; (2) asthma; (3) GERD; (4) chronic bronchitis from cigarette smoking or other inhaled environmental irritants; (5) nonasthmatic eosinophilic bronchitis; and (6) bronchiectasis.   These conditions, singly or in combination, have accounted for up to 94% of the causes of chronic cough in prospective studies.   Other conditions have constituted no >6% of the causes in prospective studies These have included bronchogenic carcinoma, chronic interstitial pneumonia, sarcoidosis, left ventricular failure, ACEI-induced cough, and aspiration from a condition associated with pharyngeal dysfunction.    Chronic cough is often simultaneously caused by more than one condition. A single cause has been found from 38 to 82% of the time, multiple causes from 18 to 62%. Multiply caused cough has been the result of three diseases up to 42% of the time.       Although on inderal and it is  tempting to call this cough variant asthma, the cough did not respond at all to prednisone and occurs early on inspiration typical of Upper airway cough syndrome (previously labeled PNDS) , is  so named because it's frequently impossible to sort out how much is  CR/sinusitis with freq throat clearing (which can be related to primary GERD)   vs  causing  secondary (" extra esophageal")  GERD from wide swings in gastric pressure that occur with throat clearing, often  promoting self use of mint and menthol lozenges that reduce the lower esophageal sphincter tone and exacerbate the problem further in a cyclical fashion.   These are the same pts (now being labeled as having "irritable larynx syndrome" by some  cough centers) who not infrequently have a history of having failed to tolerate ace inhibitors,  dry powder inhalers or biphosphonates or report having atypical/extraesophageal reflux symptoms that don't respond to standard doses of PPI  and are easily confused as having aecopd or asthma flares by even experienced allergists/ pulmonologists (myself included).   Of the three most common causes of  Sub-acute or recurrent or chronic cough, only one (GERD)  can actually contribute to/ trigger  the other two (asthma and post nasal drip syndrome)  and perpetuate the cylce of cough.  While not intuitively obvious, many patients with chronic low grade reflux do not cough until there is a primary insult that disturbs the protective epithelial barrier and exposes sensitive nerve endings.   This is typically viral as is likely  the case here  but can be direct physical injury such as with an endotracheal tube.   The point is that once this occurs, it is difficult to eliminate the cycle  using anything but a maximally effective acid suppression regimen at least in the short run, accompanied by an appropriate diet to address non acid GERD and control / eliminate the cough itself for at least 3 days.   rec  w/u as above and in meatime go ahead with cyclical cough protocol using tramadol as "allergy" to codeine was actually intolerance and remote (bad dreams) as already tried tessilon s improvement  Hold further abx/ prednisone for now    Advised:  The standardized cough guidelines published in Chest by [place] in 2006 are still the best  available and consist of a multiple step process (up to 12!) , not a single office visit,  and are intended  to address this problem logically,  with an alogrithm dependent on response to empiric treatment at  each progressive step  to determine a specific diagnosis with  minimal addtional testing needed. Therefore if adherence is an issue or can't be accurately verified,  it's  very unlikely the standard evaluation and treatment will be successful here.    Furthermore, response to therapy (other than acute cough suppression, which should only be used short term with avoidance of narcotic containing cough syrups if possible), can be a gradual process for which the patient is not likely to  perceive immediate benefit.  Unlike going to an eye doctor where the best perscription is almost always the first one and is immediately effective, this is almost never the case in the management of chronic cough syndromes. Therefore the patient needs to commit up front to consistently adhere to recommendations  for up to 6 weeks of therapy directed at the likely underlying problem(s) before the response can be reasonably evaluated.    Total time devoted to counseling  > 50 % of initial 60 min office visit:  review case with pt/ discussion of options/alternatives/ personally creating written customized instructions  in presence of pt  then going over those specific  Instructions directly with the pt including how to use all of the meds but in particular covering each new medication in detail and the difference between the maintenance= "automatic" meds and the prns using an action plan format for the latter (If this problem/symptom => do that organization reading Left to right).  Please see AVS from this visit for a full list of these instructions which I personally wrote for this pt and  are unique to this visit.

## 2017-08-01 LAB — RESPIRATORY ALLERGY PROFILE REGION II ~~LOC~~
Allergen, Cedar tree, t12: <0.1 kU/L
Allergen, Comm Silver Birch, t9: <0.1 kU/L
Allergen, Cottonwood, t14: <0.1 kU/L
Allergen, Mouse Urine Protein, e78: <0.1 kU/L
Allergen, Oak,t7: <0.1 kU/L
Allergen, P. notatum, m1: <0.1 kU/L
Aspergillus fumigatus, m3: <0.1 kU/L
Bermuda Grass: <0.1 kU/L
Cat Dander: <0.1 kU/L
Dog Dander: <0.1 kU/L
Elm IgE: <0.1 kU/L
IgE (Immunoglobulin E), Serum: 10 kU/L (ref <115)
Pecan/Hickory Tree IgE: <0.1 kU/L
Timothy Grass: <0.1 kU/L

## 2017-08-03 ENCOUNTER — Ambulatory Visit (INDEPENDENT_AMBULATORY_CARE_PROVIDER_SITE_OTHER)
Admission: RE | Admit: 2017-08-03 | Discharge: 2017-08-03 | Disposition: A | Discharge status: Home / Self Care | Payer: BLUE CROSS/BLUE SHIELD | Source: Ambulatory Visit | Attending: Internal Medicine | Admitting: Internal Medicine

## 2017-08-03 DIAGNOSIS — R05 Cough: Secondary | ICD-10-CM | POA: Diagnosis not present

## 2017-08-03 DIAGNOSIS — R058 Other specified cough: Secondary | ICD-10-CM

## 2017-08-06 ENCOUNTER — Telehealth: Payer: Self-pay | Admitting: Internal Medicine

## 2017-08-06 MED ORDER — AZELASTINE-FLUTICASONE 137-50 MCG/ACT NA SUSP: 1.0000 | Freq: Two times a day (BID) | NASAL | 0 refills | Status: DC

## 2017-08-06 NOTE — Telephone Encounter (Signed)
Call patient : Study is c/w rhinitis but not sinusitis - if hasn't tried it, best option if doesn't make her sleepy is For drainage / throat tickle from rhinitis try take CHLORPHENIRAMINE 4 mg - take one every 4 hours as needed - available over the counter- may cause drowsiness so start with just a bedtime dose or two and see how you tolerate it before trying in daytime  --------  Spoke with pt, aware of recs.  States she has been taking Chlorpheniramine since 8/7 OV with no change in s/s, and is requesting further recs.    MW please advise.  Thanks.   8/7 AVS Patient Instructions   Please see patient coordinator before you leave today  to schedule sinus CT    Please remember to go to the lab department downstairs in the basement  for your tests - we will call you with the results when they are available.       The key to effective treatment for your cough is eliminating the non-stop cycle of cough you're stuck in long enough to let your airway heal completely and then see if there is anything still making you cough once you stop the cough suppression, but this should take no more than 5 days to figure out   First take delsym two tsp every 12 hours and supplement if needed with  tramadol 50 mg up to 2 every 4 hours to suppress the urge to cough at all or even clear your throat. Swallowing water or using ice chips/non mint and menthol containing candies (such as lifesavers or sugarless jolly ranchers) are also effective.  You should rest your voice and avoid activities that you know make you cough.   Once you have eliminated the cough for 3 straight days try reducing the tramadol first,  then the delsym as tolerated.       Protonix (pantoprazole) Take 30-60 min before first meal of the day and Pepcid 20 mg one bedtime plus Chlorpheniramine 4 mg x 2 at bedtime (both available over the counter)  until cough is completely gone for at least a week without the need for cough suppression   GERD  (REFLUX)  is an extremely common cause of respiratory symptoms, many times with no significant heartburn at all.     It can be treated with medication, but also with lifestyle changes including avoidance of late meals, excessive alcohol, smoking cessation, and avoid fatty foods, chocolate, peppermint, colas, red wine, and acidic juices such as orange juice.  NO MINT OR MENTHOL PRODUCTS SO NO COUGH DROPS  USE HARD CANDY INSTEAD (jolley ranchers or Stover's or Lifesavers (all available in sugarless versions) NO OIL BASED VITAMINS - use powdered substitutes.     If not completely better by 2 weeks please return with all your active medications and we'll regroup and try another approach

## 2017-08-06 NOTE — Telephone Encounter (Signed)
Called spoke with patient and discussed MW's recommendations as stated below Pt okay with trying the Dymista - 1 sample obtained and documented per protocol Pt will call back in 2 weeks if no better  Nothing further needed at this time; will sign off

## 2017-08-06 NOTE — Telephone Encounter (Signed)
  If not completely better by 2 weeks please return with all your active medications and we'll regroup and try another approach but will need to come with all meds in hand   In meantime can try change flonase to  dymista one bid (give one bottle as sample) in each nostril with other option return to see allergy

## 2017-08-16 ENCOUNTER — Encounter: Payer: Self-pay | Admitting: Acute Care

## 2017-08-16 ENCOUNTER — Ambulatory Visit (INDEPENDENT_AMBULATORY_CARE_PROVIDER_SITE_OTHER): Payer: BLUE CROSS/BLUE SHIELD | Admitting: Acute Care

## 2017-08-16 DIAGNOSIS — R05 Cough: Secondary | ICD-10-CM | POA: Diagnosis not present

## 2017-08-16 DIAGNOSIS — R058 Other specified cough: Secondary | ICD-10-CM

## 2017-08-16 NOTE — Patient Instructions (Addendum)
It is good to see you today. Continue Delsym every 12 hours Try taking the Tramadol each night when working during the day. On days that you are not working , take as previously prescribed by Dr. Sherene Sires for cough cycle suppression. Do not drive if sleepy. Continue the protonix, pepcid and chlorpheniramine as you have been doing. We will give you a copy of the GERD diet. Sips of water instead of throat clearing. Throat soothing with sugar free candies. You can try adding Simethicone for gas. Follow up with Dr. Sherene Sires in 2 months. Please contact office for sooner follow up if symptoms do not improve or worsen or seek emergency care

## 2017-08-16 NOTE — Progress Notes (Signed)
Chart and office note reviewed in detail  > agree with a/p as outlined    

## 2017-08-16 NOTE — Progress Notes (Signed)
History of Present Illness Robin Robles is a 61 y.o. female with chronic cough. She is seen by Dr. Sherene Sires.  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 pna as baby / lots of strep infections as child later in her 35's developed episodic sinus infections assoc with some mild coughing  sinus surgery 2012 by [facility] > episodes typically once a year and maint flonase then onset in Feb 2018 not assoc with obvious sinus problem but flu-like with severe cough > L post CP > cxr 02/13/17 nl rx with tamiflu > levaquin / prednisone / tessilon no better so self referred to pulmonary clinic 07/31/2017   08/16/2017 2 week Follow Up Visit. Pt. Was seen for consult  by Dr. Sherene Sires 07/31/2017 for chronic cough. Plan at that time was:  Plan 07/31/2017 per Dr. Sherene Sires:  First take delsym two tsp every 12 hours and supplement if needed with tramadol 50 mg up to 2 every 4 hours to suppress the urge to cough at all or even clear your throat. Swallowing water or using ice chips/non mint and menthol containing candies (such as lifesavers or sugarless jolly ranchers) are also effective. You should rest your voice and avoid activities that you know make you cough.  Once you have eliminated the cough for 3 straight days try reducing the tramadol first, then the delsym as tolerated.  Protonix (pantoprazole) Take 30-60 min before first meal of the day and Pepcid 20 mg one bedtime plus Chlorpheniramine 4 mg x 2 at bedtime (both available over the counter) until cough is completely gone for at least a week without the need for cough suppression  GERD (REFLUX) is an extremely common cause of respiratory symptoms, many times with no significant heartburn at all.  It can be treated with medication, but also with lifestyle changes including avoidance of late meals, excessive alcohol, smoking cessation, and avoid fatty foods, chocolate, peppermint, colas, red wine, and acidic juices such as orange juice.  NO MINT OR MENTHOL PRODUCTS SO NO  COUGH DROPS  USE HARD CANDY INSTEAD (jolley ranchers or Stover's or Lifesavers (all available in sugarless versions) NO OIL BASED VITAMINS - use powdered substitutes.   Pt returns for follow up.She states she is contiuing to cough but it is much improved. She has had no episodes of cough with emesis. She states she is coughing less.  She states she is not taking the Delsym every 12 hours for cough as was prescribed by Dr. Sherene Sires. She thought it would make her sleepy so she has just been taking it at all. She took the Tramadol Friday evening, Saturday and Sunday after she saw Dr. Sherene Sires. She states it did make the cough a lot better. Due to her work she is unable to take the medications during the day, therefore she took no more tramadol..She states she is taking the Pepcid 20 mg one bedtime plus Chlorpheniramine 4 mg x 2 at bedtime. She has not used the Dymista that was prescribed yet.She denies fever, chest pain, orthopnea, or hemoptysis.  Test Results:  CBC Latest Ref Rng & Units 07/31/2017 11/20/2016 09/20/2013  WBC 4.0 - 10.5 K/uL 5.7 6.7 7.0  Hemoglobin 12.0 - 15.0 g/dL 65.7 84.6 96.2  Hematocrit 36.0 - 46.0 % 42.9 42.2 41.1  Platelets 150.0 - 400.0 K/uL 225.0 225 259    BMP Latest Ref Rng & Units 11/20/2016 09/20/2013 01/18/2011  Glucose 65 - 99 mg/dL 92 952(W) 413(K)  BUN 6 - 20 mg/dL 17  22 18  Creatinine 0.44 - 1.00 mg/dL 0.37 5.43 6.06  Sodium 135 - 145 mmol/L 139 139 141  Potassium 3.5 - 5.1 mmol/L 3.9 4.0 3.9  Chloride 101 - 111 mmol/L 108 103 105  CO2 22 - 32 mmol/L 23 25 27   Calcium 8.9 - 10.3 mg/dL 9.6 9.5 9.4    Ct Maxillofacial Ltd Wo Cm  Result Date: 08/03/2017 CLINICAL DATA:  61 year old female with upper airway cough syndrome. Productive beginning in February. EXAM: CT PARANASAL SINUS LIMITED WITHOUT CONTRAST TECHNIQUE: Non-contiguous multidetector CT images of the paranasal sinuses were obtained in a single plane without contrast. COMPARISON:  CTA neck 11/20/2016. Paranasal  sinus and head CT 09/12/2005. FINDINGS: Grossly negative visible noncontrast brain parenchyma, orbit soft tissues and face soft tissues. No acute osseous abnormality identified. Visible paranasal sinuses appear stable in clear compared to 2017. Symmetric appearing nasal cavity mucosal thickening similar to the 2017 CTA. Leftward nasal septal deviation. IMPRESSION: 1. Normal visible paranasal sinuses. 2. Symmetric paranasal sinus mucosal thickening raising the possibility of rhinitis. Mild nasal septal deviation. Electronically Signed   By: Odessa Fleming M.D.   On: 08/03/2017 10:27     Past medical hx Past Medical History:  Diagnosis Date  . Fibromyalgia   . Hypertension      Social History  Substance Use Topics  . Smoking status: Never Smoker  . Smokeless tobacco: Never Used  . Alcohol use No    Robin Robles reports that she has never smoked. She has never used smokeless tobacco. She reports that she does not drink alcohol or use drugs.  Tobacco Cessation: Never smoker  Past surgical hx, Family hx, Social hx all reviewed.  Current Outpatient Prescriptions on File Prior to Visit  Medication Sig  . EPINEPHrine 0.3 mg/0.3 mL IJ SOAJ injection Inject into the muscle.  . famotidine (PEPCID) 20 MG tablet One at bedtime  . fluticasone (FLONASE) 50 MCG/ACT nasal spray   . pantoprazole (PROTONIX) 40 MG tablet Take 1 tablet (40 mg total) by mouth daily. Take 30-60 min before first meal of the day  . pregabalin (LYRICA) 50 MG capsule Take 50 mg by mouth daily.  . propranolol (INDERAL) 40 MG tablet Take 40 mg by mouth every evening.  . sertraline (ZOLOFT) 100 MG tablet Take 200 mg by mouth every morning.   . topiramate (TOPAMAX) 25 MG tablet Take 75 mg by mouth at bedtime.  . traMADol (ULTRAM) 50 MG tablet 1-2 every 4 hours as needed for cough or pain   No current facility-administered medications on file prior to visit.      Allergies  Allergen Reactions  . Bee Venom     Throat swelling   .  Codeine Other (See Comments)    hallucinations    Review Of Systems:  Constitutional:   No  weight loss, night sweats,  Fevers, chills, fatigue, or  lassitude.  HEENT:   No headaches,  Difficulty swallowing,  Tooth/dental problems, or  Sore throat,                No sneezing, itching, ear ache, nasal congestion, post nasal drip,   CV:  No chest pain,  Orthopnea, PND, swelling in lower extremities, anasarca, dizziness, palpitations, syncope.   GI  No heartburn, indigestion, abdominal pain, nausea, vomiting, diarrhea, change in bowel habits, loss of appetite, bloody stools.   Resp: No shortness of breath with exertion or at rest.  No excess mucus, no productive cough,  + but improved non-productive cough,  No coughing up of blood.  No change in color of mucus.  No wheezing.  No chest wall deformity  Skin: no rash or lesions.  GU: no dysuria, change in color of urine, no urgency or frequency.  No flank pain, no hematuria   MS:  No joint pain or swelling.  No decreased range of motion.  No back pain.  Psych:  No change in mood or affect. No depression or anxiety.  No memory loss.   Vital Signs BP 110/60 (BP Location: Left Arm, Patient Position: Sitting, Cuff Size: Normal)   Pulse 80   Ht 5' 2.25" (1.581 m)   Wt 160 lb 3.2 oz (72.7 kg)   SpO2 97%   BMI 29.07 kg/m    Physical Exam:  General- No distress,  A&Ox3, pleasant ENT: No sinus tenderness, TM clear, pale nasal mucosa, no oral exudate,no post nasal drip, no LAN Cardiac: S1, S2, regular rate and rhythm, no murmur Chest: No wheeze/ rales/ dullness; no accessory muscle use, no nasal flaring, no sternal retractions Abd.: Soft Non-tender, nonobese, bowel sounds positive Ext: No clubbing cyanosis, edema Neuro:  normal strength Skin: No rashes, warm and dry Psych: normal mood and behavior   Assessment/Plan  Upper airway cough syndrome Patient presents today with improved but not resolved cough Patient has not been  compliant with Delsym every 12 hours Patient has not been compliant with tramadol due to work schedule Plan Continue Delsym every 12 hours, this medication should not make you sleepy Try taking the Tramadol each night when working during the day. On days that you are not working , take as previously prescribed by Dr. Sherene Sires for cough cycle suppression. Do not drive if sleepy. Continue the protonix, pepcid and chlorpheniramine as you have been doing. We will give you a copy of the GERD diet. Sips of water instead of throat clearing. Throat soothing with sugar free candies. You can try adding Simethicone for gas. Follow up with Dr. Sherene Sires in 2 months. Please contact office for sooner follow up if symptoms do not improve or worsen or seek emergency care       Bevelyn Ngo, NP 08/16/2017  3:16 PM

## 2017-08-16 NOTE — Assessment & Plan Note (Addendum)
Patient presents today with improved but not resolved cough Patient has not been compliant with Delsym every 12 hours Patient has not been compliant with tramadol due to work schedule Plan Continue Delsym every 12 hours, this medication should not make you sleepy Try taking the Tramadol each night when working during the day. On days that you are not working , take as previously prescribed by Dr. Sherene Sires for cough cycle suppression. Do not drive if sleepy. Continue the protonix, pepcid and chlorpheniramine as you have been doing. We will give you a copy of the GERD diet. Sips of water instead of throat clearing. Throat soothing with sugar free candies. You can try adding Simethicone for gas. Follow up with Dr. Sherene Sires in 2 months. Please contact office for sooner follow up if symptoms do not improve or worsen or seek emergency care

## 2017-10-16 ENCOUNTER — Ambulatory Visit: Payer: BLUE CROSS/BLUE SHIELD | Admitting: Internal Medicine

## 2017-10-30 ENCOUNTER — Other Ambulatory Visit: Payer: Self-pay | Admitting: Internal Medicine

## 2017-10-30 DIAGNOSIS — R05 Cough: Secondary | ICD-10-CM

## 2017-10-30 DIAGNOSIS — R058 Other specified cough: Secondary | ICD-10-CM

## 2018-03-04 ENCOUNTER — Ambulatory Visit (HOSPITAL_COMMUNITY)
Admission: RE | Admit: 2018-03-04 | Discharge: 2018-03-04 | Disposition: A | Discharge status: Home / Self Care | Payer: BLUE CROSS/BLUE SHIELD | Source: Ambulatory Visit | Attending: Physician Assistant | Admitting: Physician Assistant

## 2018-03-04 ENCOUNTER — Other Ambulatory Visit (HOSPITAL_COMMUNITY): Payer: Self-pay | Admitting: Physician Assistant

## 2018-03-04 DIAGNOSIS — R1031 Right lower quadrant pain: Secondary | ICD-10-CM

## 2018-03-04 LAB — CREATININE, SERUM: Creatinine, Ser: 0.86 mg/dL (ref 0.44–1.00)

## 2018-03-04 MED ORDER — IOPAMIDOL (ISOVUE-300) INJECTION 61%
INTRAVENOUS | Status: AC
  Administered 2018-03-04: 100 mL
  Filled 2018-03-04: qty 100

## 2018-05-30 ENCOUNTER — Ambulatory Visit (INDEPENDENT_AMBULATORY_CARE_PROVIDER_SITE_OTHER): Payer: BLUE CROSS/BLUE SHIELD

## 2018-05-30 ENCOUNTER — Encounter (HOSPITAL_COMMUNITY): Payer: Self-pay | Admitting: Emergency Medicine

## 2018-05-30 ENCOUNTER — Ambulatory Visit (HOSPITAL_COMMUNITY)
Admission: EM | Admit: 2018-05-30 | Discharge: 2018-05-30 | Disposition: A | Discharge status: Home / Self Care | Payer: BLUE CROSS/BLUE SHIELD | Attending: Family Medicine | Admitting: Family Medicine

## 2018-05-30 DIAGNOSIS — S63299A Dislocation of distal interphalangeal joint of unspecified finger, initial encounter: Secondary | ICD-10-CM | POA: Diagnosis not present

## 2018-05-30 DIAGNOSIS — M25512 Pain in left shoulder: Secondary | ICD-10-CM

## 2018-05-30 DIAGNOSIS — S63294A Dislocation of distal interphalangeal joint of right ring finger, initial encounter: Secondary | ICD-10-CM | POA: Diagnosis not present

## 2018-05-30 DIAGNOSIS — W19XXXA Unspecified fall, initial encounter: Secondary | ICD-10-CM | POA: Diagnosis not present

## 2018-05-30 NOTE — ED Provider Notes (Signed)
[facility]    CSN: [id number] Arrival date & time: 05/30/18  1217     History   Chief Complaint Chief Complaint  Patient presents with  . Fall  . Hand Pain    HPI Venita SheffieldDeborah C Heron is a 62 y.o. female.   HPI  Patient fell this morning a couple hours before arriving here and jammed her finger into a wall as she reached up to stop the fall.  She now has deformity and pain in her finger.  Otherwise she does not feel injured.  She is under her primary care doctor care for left shoulder pain and is in physical therapy for this.  Her shoulder is a little bit more painful after her fall.  She knows to take her anti-inflammatory, ice, follow-up with her physical therapist her primary care doctor for ongoing shoulder problems.  Past Medical History:  Diagnosis Date  . Fibromyalgia   . Hypertension     Patient Active Problem List   Diagnosis Date Noted  . Upper airway cough syndrome 07/31/2017    Past Surgical History:  Procedure Laterality Date  . ABDOMINAL HYSTERECTOMY    . CHOLECYSTECTOMY    . THYROID SURGERY      OB History   None      Home Medications    Prior to Admission medications   Medication Sig Start Date End Date Taking? Authorizing Provider  chlorpheniramine (CHLOR-TRIMETON) 4 MG tablet Take 4 mg by mouth 2 (two) times daily as needed for allergies.    [provider]  cyclobenzaprine (FLEXERIL) 10 MG tablet Take 10 mg by mouth 3 (three) times daily as needed for muscle spasms.    [provider]  EPINEPHrine 0.3 mg/0.3 mL IJ SOAJ injection Inject into the muscle. 06/06/17   [provider]  famotidine (PEPCID) 20 MG tablet TAKE 1 TABLET BY MOUTH AT BEDTIME 10/30/17   Nyoka CowdenWert, Michael B, MD  fluticasone [facility](FLONASE) 50 MCG/ACT nasal spray  07/02/17   [provider]  pantoprazole (PROTONIX) 40 MG tablet TAKE 1 TABLET BY MOUTH DAILY TAKE 30 TO 60 MIN BEFORE FIRST MEAL OF DAY 10/30/17   Nyoka CowdenWert, Michael B, MD  pregabalin  (LYRICA) 50 MG capsule Take 50 mg by mouth daily.    [provider]  propranolol (INDERAL) 40 MG tablet Take 40 mg by mouth every evening.    [provider]  sertraline (ZOLOFT) 100 MG tablet Take 200 mg by mouth every morning.     [provider]  topiramate (TOPAMAX) 25 MG tablet Take 75 mg by mouth at bedtime.    [provider]  traMADol Janean Sark(ULTRAM) 50 MG tablet 1-2 every 4 hours as needed for cough or pain 07/31/17   Nyoka CowdenWert, Michael B, MD    Family History No family history on file.  Social History Social History   Tobacco Use  . Smoking status: Never Smoker  . Smokeless tobacco: Never Used  Substance Use Topics  . Alcohol use: No  . Drug use: No     Allergies   Bee venom and Codeine   Review of Systems Review of Systems  Constitutional: Negative for chills and fever.  HENT: Negative for ear pain and sore throat.   Eyes: Negative for pain and visual disturbance.  Respiratory: Negative for cough and shortness of breath.   Cardiovascular: Negative for chest pain and palpitations.  Gastrointestinal: Negative for abdominal pain and vomiting.  Genitourinary: Negative for dysuria and hematuria.  Musculoskeletal: Negative for  arthralgias and back pain.       Finger deformity  Skin: Negative for color change and rash.  Neurological: Negative for seizures and syncope.  All other systems reviewed and are negative.    Physical Exam Triage Vital Signs ED Triage Vitals  Enc Vitals Group     BP      Pulse      Resp      Temp      Temp src      SpO2      Weight      Height      Head Circumference      Peak Flow      Pain Score      Pain Loc      Pain Edu?      Excl. in GC?    No data found.  Updated Vital Signs There were no vitals taken for this visit.       Physical Exam  Constitutional: She appears well-developed and well-nourished. No distress.  HENT:  Head: Normocephalic and atraumatic.  Mouth/Throat: Oropharynx is  clear and moist.  Eyes: Pupils are equal, round, and reactive to light. Conjunctivae are normal.  Neck: Normal range of motion.  Cardiovascular: Normal rate.  Pulmonary/Chest: Effort normal. No respiratory distress.  Abdominal: Soft. She exhibits no distension.  Musculoskeletal: Normal range of motion. She exhibits no edema.  Right ring finger has deformity with dorsal angulation of the DIP.  X-rays confirmed dislocation.  Neurological: She is alert.  Skin: Skin is warm and dry.  Psychiatric: She has a normal mood and affect. Her behavior is normal.     UC Treatments / Results  Labs (all labs ordered are listed, but only abnormal results are displayed) Labs Reviewed - No data to display  EKG None  Radiology No results found.  Procedures Orthopedic Injury Treatment Date/Time: 05/30/2018 1:33 PM Performed by: Eustace Moore, MD Authorized by: Eustace Moore, MD   Consent:    Consent obtained:  Verbal   Consent given by:  Patient   Risks discussed:  Fracture, restricted joint movement and recurrent dislocation   Alternatives discussed:  No treatment and alternative treatmentInjury location: finger Location details: right ring finger Injury type: dislocation Dislocation type: DIP Pre-procedure neurovascular assessment: neurovascularly intact Pre-procedure distal perfusion: normal Pre-procedure neurological function: normal Pre-procedure range of motion: reduced Anesthesia: digital block  Anesthesia: Local anesthesia used: yes Local Anesthetic: lidocaine 2% without epinephrine Anesthetic total: 2 mL  Patient sedated: NoImmobilization: splint Supplies used: aluminum splint Post-procedure neurovascular assessment: post-procedure neurovascularly intact Post-procedure distal perfusion: normal Post-procedure neurological function: normal Post-procedure range of motion: normal Patient tolerance: Patient tolerated the procedure well with no immediate  complications Comments: Patient expressed appreciation and pleasure at the immediate relief of pain, and return of movement after reduction    (including critical care time)  Medications Ordered in UC Medications - No data to display  Initial Impression / Assessment and Plan / UC Course  I have reviewed the triage vital signs and the nursing notes.  Pertinent labs & imaging results that were available during my care of the patient were reviewed by me and considered in my medical decision making (see chart for details).     Discussed options.  Patient wishes to have her finger cared for here.  Discussed that after dislocation if there was suspicion of fracture or difficulty with the procedure she would need to see orthopedics.  Patient agrees. Final Clinical Impressions(s) / UC Diagnoses  Final diagnoses:  None   Discharge Instructions   None    ED Prescriptions    None     Controlled Substance Prescriptions [place] Controlled Substance Registry consulted? Not Applicable   Eustace Moore, MD 05/30/18 1335

## 2018-05-30 NOTE — ED Triage Notes (Signed)
Pt states she leaned over and felt dizzy and fell over, landed on her R hand, and hit her L shoulder as well. Pt has deformity to R ring finger.

## 2018-05-30 NOTE — Discharge Instructions (Signed)
Wear splint for 2 weeks May remove for bathing and lotion Use ide to reduce pain and swelling Wear at all times for 48 hours, then remove 2 x a day for gentle movement See your doctor if fails to heal in 2 -3 weeks

## 2018-10-23 ENCOUNTER — Emergency Department (HOSPITAL_COMMUNITY): Payer: BLUE CROSS/BLUE SHIELD

## 2018-10-23 ENCOUNTER — Other Ambulatory Visit: Payer: Self-pay

## 2018-10-23 ENCOUNTER — Emergency Department (HOSPITAL_COMMUNITY)
Admission: EM | Admit: 2018-10-23 | Discharge: 2018-10-23 | Disposition: A | Discharge status: Home / Self Care | Payer: BLUE CROSS/BLUE SHIELD | Attending: Emergency Medicine | Admitting: Emergency Medicine

## 2018-10-23 ENCOUNTER — Encounter (HOSPITAL_COMMUNITY): Payer: Self-pay

## 2018-10-23 DIAGNOSIS — Z79899 Other long term (current) drug therapy: Secondary | ICD-10-CM | POA: Diagnosis not present

## 2018-10-23 DIAGNOSIS — R1030 Lower abdominal pain, unspecified: Secondary | ICD-10-CM | POA: Insufficient documentation

## 2018-10-23 DIAGNOSIS — R110 Nausea: Secondary | ICD-10-CM | POA: Insufficient documentation

## 2018-10-23 DIAGNOSIS — I10 Essential (primary) hypertension: Secondary | ICD-10-CM | POA: Diagnosis not present

## 2018-10-23 DIAGNOSIS — R350 Frequency of micturition: Secondary | ICD-10-CM | POA: Diagnosis not present

## 2018-10-23 HISTORY — DX: Migraine, unspecified, not intractable, without status migrainosus: G43.909

## 2018-10-23 LAB — COMPREHENSIVE METABOLIC PANEL
ALK PHOS: 85 U/L (ref 38–126)
ALT: 30 U/L (ref 0–44)
AST: 27 U/L (ref 15–41)
Albumin: 4 g/dL (ref 3.5–5.0)
Anion gap: 8 (ref 5–15)
BUN: 23 mg/dL (ref 8–23)
CALCIUM: 9.3 mg/dL (ref 8.9–10.3)
CO2: 25 mmol/L (ref 22–32)
CREATININE: 0.72 mg/dL (ref 0.44–1.00)
Chloride: 107 mmol/L (ref 98–111)
Glucose, Bld: 110 mg/dL — ABNORMAL HIGH (ref 70–99)
Potassium: 3.7 mmol/L (ref 3.5–5.1)
Sodium: 140 mmol/L (ref 135–145)
TOTAL PROTEIN: 7.3 g/dL (ref 6.5–8.1)
Total Bilirubin: 0.4 mg/dL (ref 0.3–1.2)

## 2018-10-23 LAB — CBC WITH DIFFERENTIAL/PLATELET
ABS IMMATURE GRANULOCYTES: 0.02 10*3/uL (ref 0.00–0.07)
BASOS PCT: 1 %
Basophils Absolute: 0 10*3/uL (ref 0.0–0.1)
EOS PCT: 2 %
Eosinophils Absolute: 0.1 10*3/uL (ref 0.0–0.5)
HCT: 43.9 % (ref 36.0–46.0)
HEMOGLOBIN: 13.8 g/dL (ref 12.0–15.0)
Immature Granulocytes: 0 %
Lymphocytes Relative: 34 %
Lymphs Abs: 1.9 10*3/uL (ref 0.7–4.0)
MCH: 29.1 pg (ref 26.0–34.0)
MCHC: 31.4 g/dL (ref 30.0–36.0)
MCV: 92.4 fL (ref 80.0–100.0)
MONO ABS: 0.5 10*3/uL (ref 0.1–1.0)
MONOS PCT: 9 %
NEUTROS ABS: 3 10*3/uL (ref 1.7–7.7)
Neutrophils Relative %: 54 %
Platelets: 217 10*3/uL (ref 150–400)
RBC: 4.75 MIL/uL (ref 3.87–5.11)
RDW: 13.2 % (ref 11.5–15.5)
WBC: 5.7 10*3/uL (ref 4.0–10.5)
nRBC: 0 % (ref 0.0–0.2)

## 2018-10-23 LAB — URINALYSIS, ROUTINE W REFLEX MICROSCOPIC
Bilirubin Urine: NEGATIVE
Glucose, UA: NEGATIVE mg/dL
HGB URINE DIPSTICK: NEGATIVE
KETONES UR: NEGATIVE mg/dL
Leukocytes, UA: NEGATIVE
Nitrite: NEGATIVE
PROTEIN: NEGATIVE mg/dL
Specific Gravity, Urine: 1.012 (ref 1.005–1.030)
pH: 5 (ref 5.0–8.0)

## 2018-10-23 LAB — LIPASE, BLOOD: LIPASE: 24 U/L (ref 11–51)

## 2018-10-23 MED ORDER — DICYCLOMINE HCL 20 MG PO TABS: 20.0000 mg | ORAL_TABLET | Freq: Two times a day (BID) | ORAL | 0 refills | Status: DC

## 2018-10-23 NOTE — ED Notes (Signed)
Patient transported to CT 

## 2018-10-23 NOTE — ED Provider Notes (Signed)
[facility]-EMERGENCY DEPT Provider Note  CSN: [id number] Arrival date & time: 10/23/18  1358  History   Chief Complaint Chief Complaint  Patient presents with  . Abdominal Pain    HPI Robin Robles is a 62 y.o. female with a medical history of HTN, migraines and fibromyalgia who presented to the ED for   Abdominal Pain   This is a new problem. The current episode started 3 to 5 hours ago. Progression since onset: waxes and wanes. The pain is associated with an unknown factor. The pain is located in the LLQ (Reports that it has migrated to RLQ). The quality of the pain is sharp. The pain is at a severity of 5/10 (at its worse 8/10). The pain is moderate. Associated symptoms include frequency. Pertinent negatives include fever, diarrhea, flatus, hematochezia, melena, nausea, vomiting, constipation, dysuria, hematuria, arthralgias and myalgias. The symptoms are aggravated by being still (lying down). The symptoms are relieved by standing. Past medical history comments: cholecystectomy and hysterectomy.    Past Medical History:  Diagnosis Date  . Fibromyalgia   . Hypertension   . Migraines     Patient Active Problem List   Diagnosis Date Noted  . Upper airway cough syndrome 07/31/2017    Past Surgical History:  Procedure Laterality Date  . ABDOMINAL HYSTERECTOMY    . CHOLECYSTECTOMY    . THYROID SURGERY       OB History   None      Home Medications    Prior to Admission medications   Medication Sig Start Date End Date Taking? Authorizing Provider  diclofenac (VOLTAREN) 75 MG EC tablet Take 75 mg by mouth 2 (two) times daily.   Yes [provider]  EPINEPHrine 0.3 mg/0.3 mL IJ SOAJ injection Inject into the muscle. 06/06/17  Yes [provider]  pregabalin (LYRICA) 50 MG capsule Take 100 mg by mouth at bedtime.    Yes [provider]  propranolol (INDERAL) 40 MG tablet Take 40 mg by mouth every evening.   Yes [provider]  sertraline (ZOLOFT) 100 MG tablet Take 200 mg by mouth every morning.    Yes [provider]  topiramate (TOPAMAX) 25 MG tablet Take 50 mg by mouth at bedtime.    Yes [provider]  dicyclomine (BENTYL) 20 MG tablet Take 1 tablet (20 mg total) by mouth 2 (two) times daily. 10/23/18   Mortis, Jerrel Ivory I, PA-C  famotidine (PEPCID) 20 MG tablet TAKE 1 TABLET BY MOUTH AT BEDTIME Patient not taking: Reported on 10/23/2018 10/30/17   Nyoka Cowden, MD  pantoprazole (PROTONIX) 40 MG tablet TAKE 1 TABLET BY MOUTH DAILY TAKE 30 TO 60 MIN BEFORE FIRST MEAL OF DAY Patient not taking: Reported on 10/23/2018 10/30/17   Nyoka Cowden, MD  traMADol Janean Sark) 50 MG tablet 1-2 every 4 hours as needed for cough or pain Patient not taking: Reported on 10/23/2018 07/31/17   Nyoka Cowden, MD    Family History History reviewed. No pertinent family history.  Social History Social History   Tobacco Use  . Smoking status: Never Smoker  . Smokeless tobacco: Never Used  Substance Use Topics  . Alcohol use: No  . Drug use: No     Allergies   Bee venom; Codeine; and Oxycodone-acetaminophen   Review of Systems Review of Systems  Constitutional: Negative for appetite change, chills and fever.  Gastrointestinal: Positive for abdominal pain. Negative for abdominal distention, blood in stool, constipation, diarrhea, flatus,  hematochezia, melena, nausea and vomiting.  Genitourinary: Positive for frequency. Negative for difficulty urinating, dysuria, hematuria and pelvic pain.  Musculoskeletal: Negative for arthralgias and myalgias.  All other systems reviewed and are negative.    Physical Exam Updated Vital Signs BP 134/89   Pulse 65   Temp 97.9 F (36.6 C) (Oral)   Resp 15   Ht 5\' 2"  (1.575 m)   Wt 76.7 kg   SpO2 95%   BMI 30.91 kg/m   Physical Exam  Constitutional: Vital signs are normal. She appears well-developed and well-nourished. She is cooperative.  She does not appear ill. No distress.  Cardiovascular: Normal rate and regular rhythm.  No murmur heard. Pulmonary/Chest: Effort normal and breath sounds normal.  Abdominal: Soft. Normal appearance and bowel sounds are normal. She exhibits no distension. There is tenderness in the right lower quadrant and suprapubic area. There is CVA tenderness (Right). There is no rigidity, no rebound and no guarding.  Musculoskeletal: Normal range of motion.  Neurological: She is alert.  Skin: Skin is warm and intact. Capillary refill takes less than 2 seconds. She is not diaphoretic. No pallor.  Nursing note and vitals reviewed.    ED Treatments / Results  Labs (all labs ordered are listed, but only abnormal results are displayed) Labs Reviewed  COMPREHENSIVE METABOLIC PANEL - Abnormal; Notable for the following components:      Result Value   Glucose, Bld 110 (*)    All other components within normal limits  CBC WITH DIFFERENTIAL/PLATELET  LIPASE, BLOOD  URINALYSIS, ROUTINE W REFLEX MICROSCOPIC    EKG None  Radiology Ct Renal Stone Study  Result Date: 10/23/2018 CLINICAL DATA:  Low abdominal pain since 1 p.m. today. EXAM: CT ABDOMEN AND PELVIS WITHOUT CONTRAST TECHNIQUE: Multidetector CT imaging of the abdomen and pelvis was performed following the standard protocol without IV contrast. COMPARISON:  03/04/2018 FINDINGS: Lower chest: Mild bibasilar atelectasis. Hepatobiliary: No focal liver abnormality is seen. Status post cholecystectomy. No biliary dilatation. Pancreas: Unremarkable. No pancreatic ductal dilatation or surrounding inflammatory changes. Spleen: Normal in size without focal abnormality. Adrenals/Urinary Tract: 2.2 cm hypodense left adrenal mass measuring -5 Hounsfield units most consistent with an adrenal adenoma. Normal right adrenal gland. Punctate nonobstructing right renal calculus. No left renal calculus. No ureteral calculi. No bladder calculi. Stomach/Bowel: Stomach is  within normal limits. Appendix appears normal. No evidence of bowel wall thickening, distention, or inflammatory changes. Sigmoid diverticulosis without evidence of diverticulitis. Vascular/Lymphatic: Normal caliber abdominal aorta with atherosclerosis. Reproductive: Status post hysterectomy. No adnexal masses. Other: No abdominal wall hernia or abnormality. No abdominopelvic ascites. Musculoskeletal: No acute or significant osseous findings. Moderate right facet arthropathy at L5-S1. IMPRESSION: 1. No acute abdominal or pelvic pathology. 2. Punctate nonobstructing right renal calculus. 3. Left adrenal mass unchanged from 03/04/2018. Significant interval increase in size compared with 04/09/2009. By density measurements the masses most consistent with an adrenal adenoma. Given the overall increase in size compared with 04/09/2009, follow-up MRI of the abdomen in 6 months could be performed to evaluate ongoing stability. Electronically Signed   By: Elige Ko   On: 10/23/2018 18:59    Procedures Procedures (including critical care time)  Medications Ordered in ED Medications - No data to display   Initial Impression / Assessment and Plan / ED Course  Triage vital signs and the nursing notes have been reviewed.  Pertinent labs & imaging results that were available during care of the patient were reviewed and considered in medical decision making (see  chart for details).  Patient presents afebrile and is well appearing for acute onset lower quadrant abdominal pain. She also has associated symptoms of nausea and urinary frequency. Physical exam has lower abdomen tenderness along with unilateral CVA tenderness. Highest concerns for appendicitis, nephrolithiasis or UTI. Will proceed with typical abdominal pain work-up with CT stone study to further evaluate for nephrolithiasis.  Clinical Course as of Oct 24 1923  Wed Oct 23, 2018  1851 Labs unremarkable. No findings consistent with infectious or acute  metabolic abnormality that could point to an etiology for complaint. UA not suggestive of UTI.   [GM]  1913 CT abdomen/pelvis resulted shows no obstructing nephrolithiasis that could be contributing to patient's pain. There is also no acute intra-abdominal or GU etiologies.    [GM]    Clinical Course User Index [GM] Mortis, Sharyon Medicus, PA-C    Final Clinical Impressions(s) / ED Diagnoses  1. Abdominal Pain. No clear etiology. Rx for Bentyl given for pain. Advised to follow-up with PCP. Education provided on s/s that would warrant sooner medical follow-up.  Dispo: Home. After thorough clinical evaluation, this patient is determined to be medically stable and can be safely discharged with the previously mentioned treatment and/or outpatient follow-up/referral(s). At this time, there are no other apparent medical conditions that require further screening, evaluation or treatment.   Final diagnoses:  Lower abdominal pain    ED Discharge Orders         Ordered    dicyclomine (BENTYL) 20 MG tablet  2 times daily     10/23/18 1924            Reva Bores 10/23/18 1924    Pricilla Loveless, MD 10/24/18 0001

## 2018-10-23 NOTE — Discharge Instructions (Addendum)
Your lab work and CT scan looked good today. There is no clear cause for your abdominal pain.  Please call Dr. Cyndia Bent and schedule an appointment so you can follow-up with him. I have sent a prescription for Bentyl which is useful for abdominal pain.  As we discussed, follow-up with a medical provider sooner if you have the following symptoms: fever, worsening abdominal pain, changes in your urinary or bowel habits, blood in the stool.  Thank you for allowing me to take care of you today.

## 2018-10-23 NOTE — ED Triage Notes (Addendum)
LLQ abd pain radiating into the groin. Started about an hour ago. Pt also reports polyuria. States she is on topamax and had trouble finding words. Pt decreased topamax dose and it initially helped with word finding abilities, but today started having difficulty again.

## 2020-02-02 IMAGING — DX DG HAND COMPLETE 3+V*R*
3 series · 3 of 3 positions shown · non-contrast
Comparison: None

CLINICAL DATA: RIGHT hand pain post fall today, struck hand on door
frame

EXAM:
RIGHT HAND - COMPLETE 3+ VIEW

[hand pa]
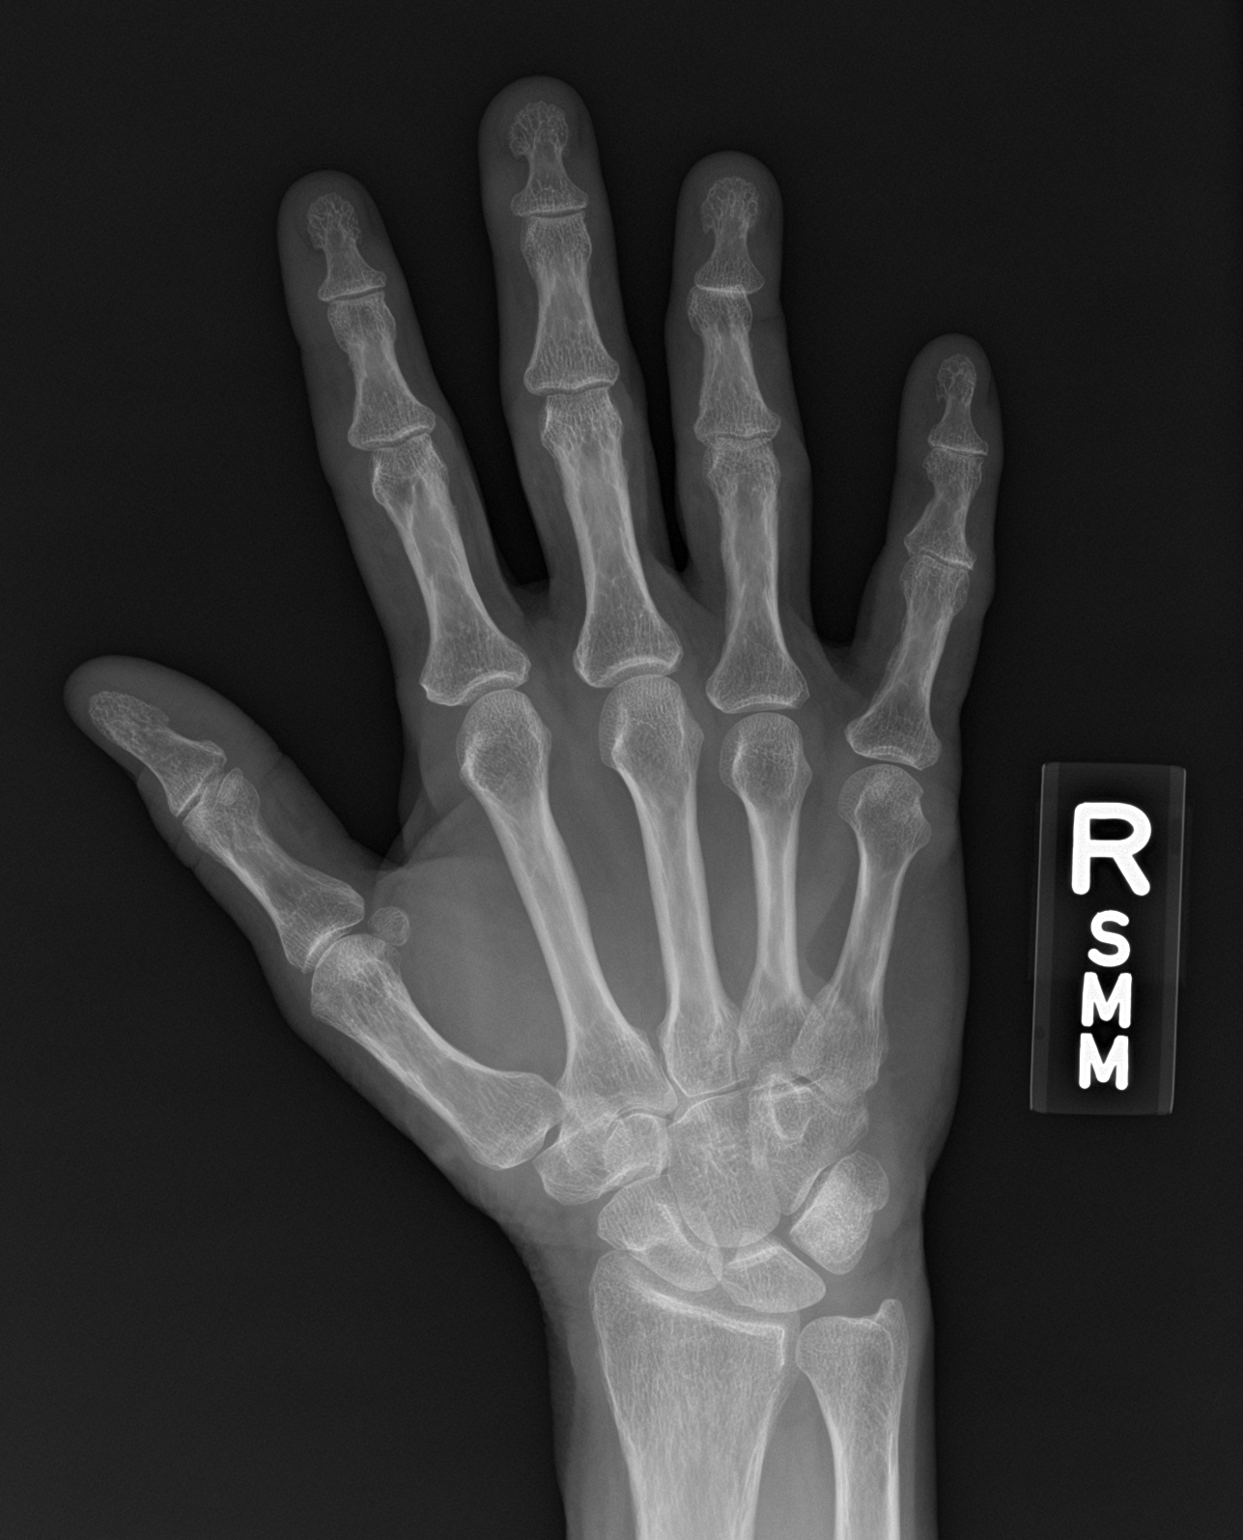

[hand obl]
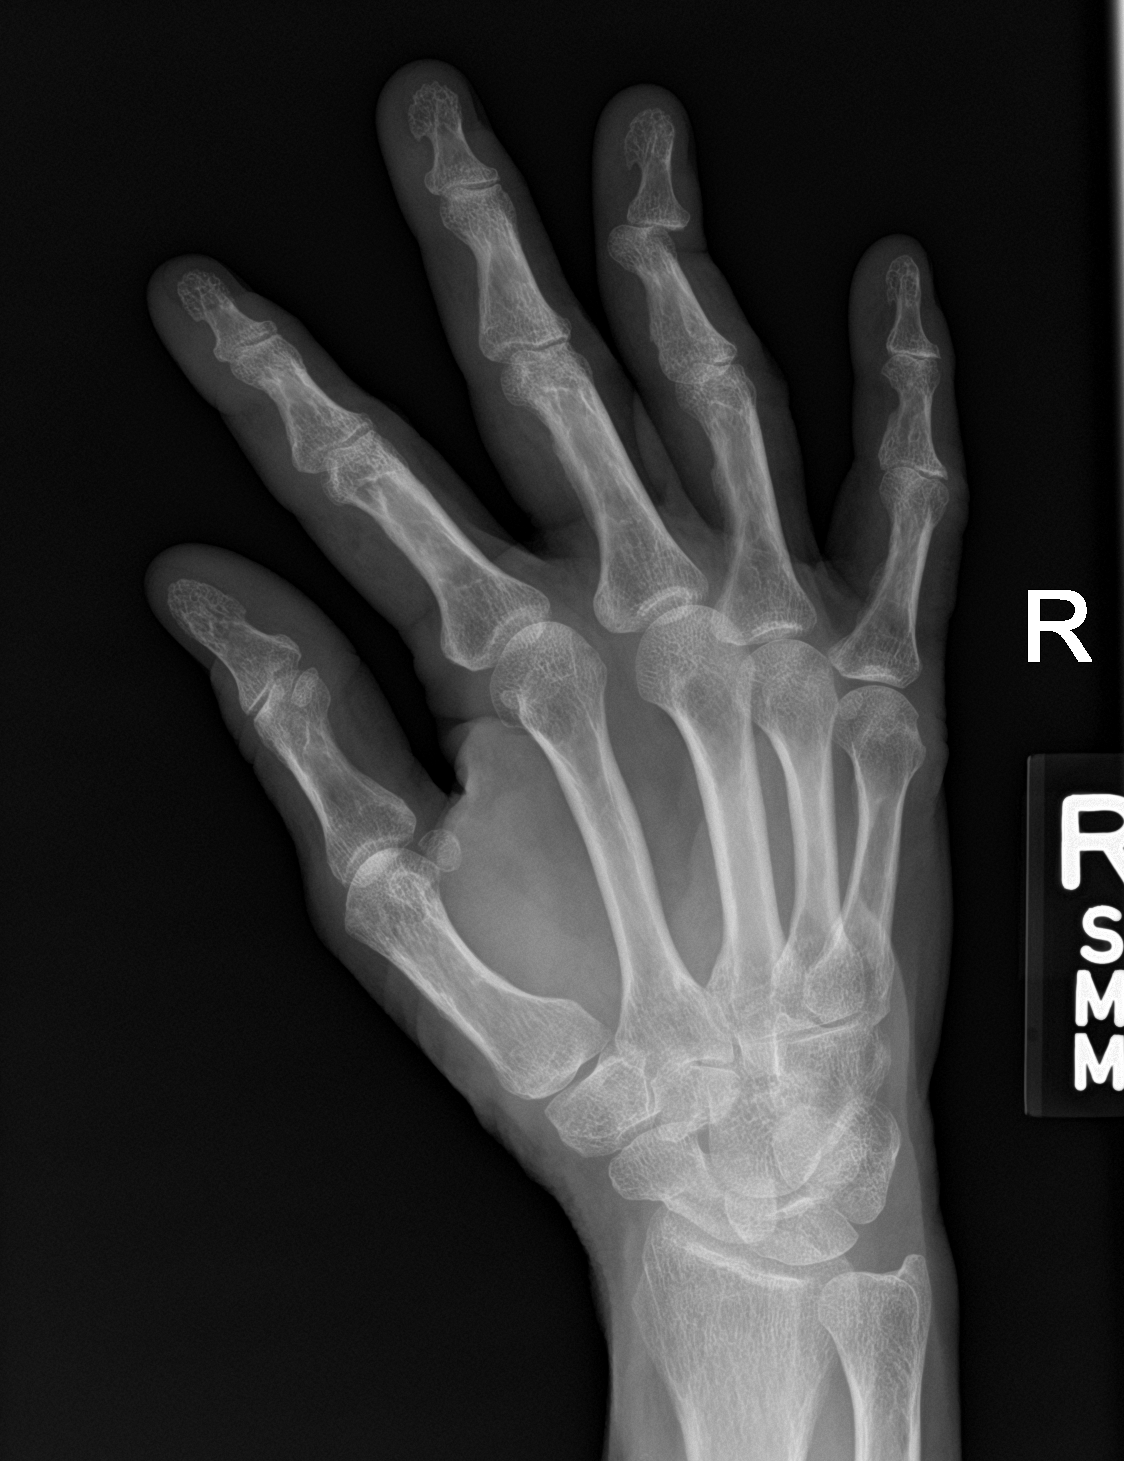

[hand lat]
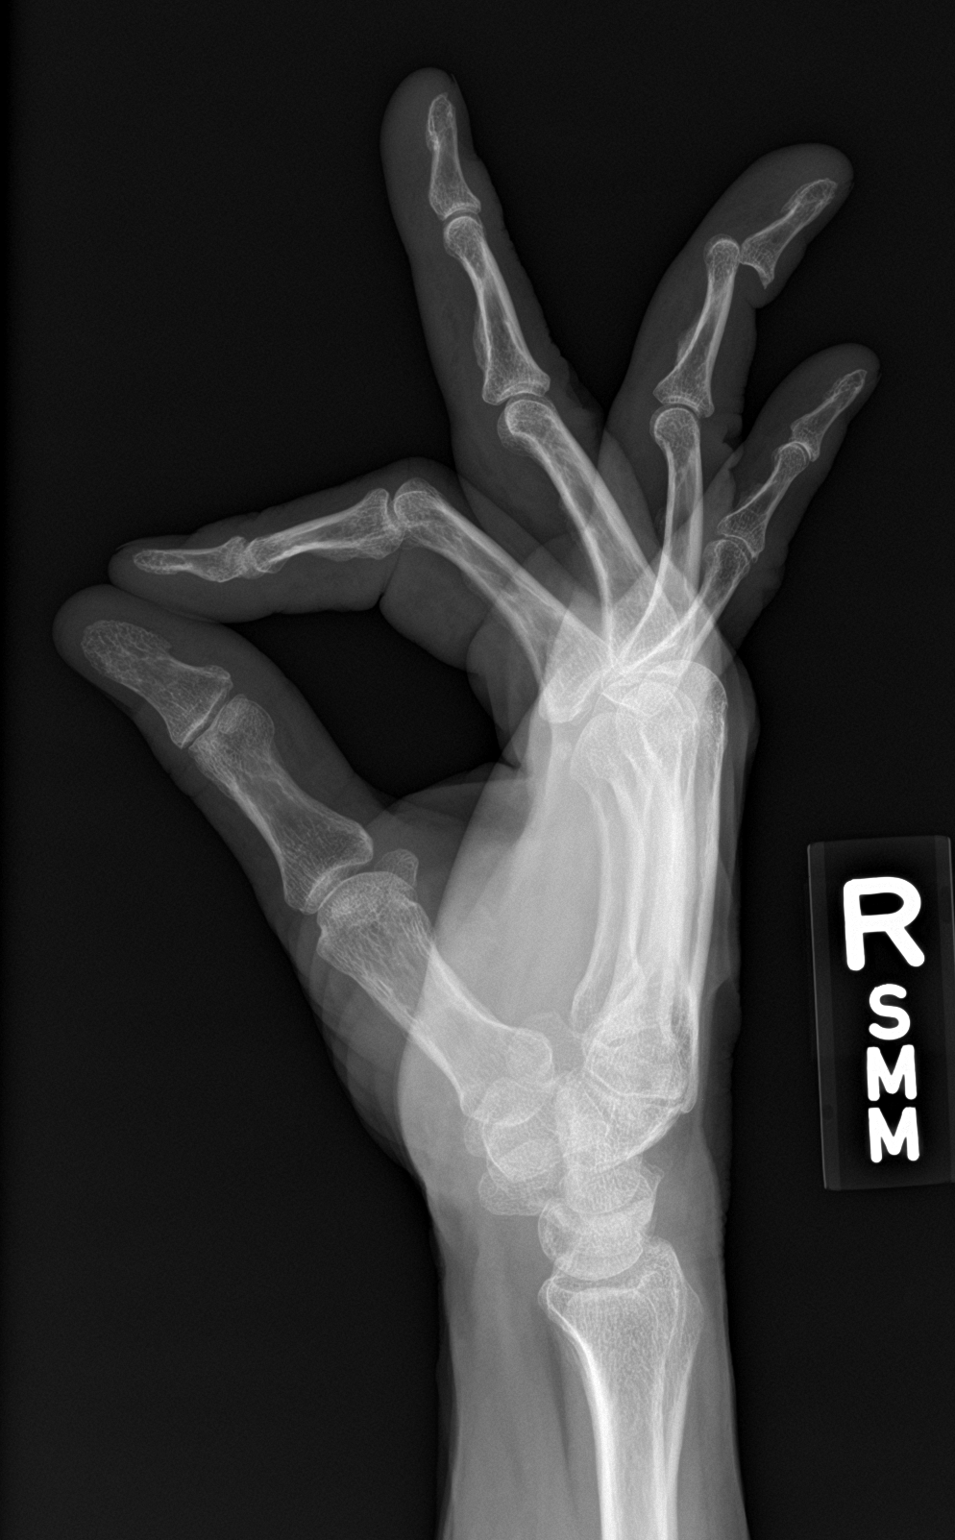

[3 of 3 positions shown; findings below may reference images not displayed]

FINDINGS: Mild osseous demineralization.

Dorsal DIP dislocation RIGHT ring finger.

Remaining joint spaces preserved.

No acute fracture, additional dislocation or bone destruction.
IMPRESSION: Dorsal dislocation DIP joint RIGHT ring finger.

## 2020-03-18 ENCOUNTER — Ambulatory Visit: Payer: BLUE CROSS/BLUE SHIELD | Attending: Internal Medicine

## 2020-03-18 DIAGNOSIS — Z23 Encounter for immunization: Secondary | ICD-10-CM

## 2020-03-18 NOTE — Progress Notes (Signed)
   Covid-19 Vaccination Clinic  Name:  Robin Robles    MRN: [id number] DOB: May 09, 1956  03/18/2020  Robin Robles was observed post Covid-19 immunization for 30 minutes based on pre-vaccination screening without incident. She was provided with Vaccine Information Sheet and instruction to access the V-Safe system.   Robin Robles was instructed to call 911 with any severe reactions post vaccine: Marland Kitchen Difficulty breathing  . Swelling of face and throat  . A fast heartbeat  . A bad rash all over body  . Dizziness and weakness   Immunizations Administered    Name Date Dose VIS Date Route   Pfizer COVID-19 Vaccine 03/18/2020  8:56 AM 0.3 mL 12/05/2019 Intramuscular   Manufacturer: [employer], [employer]   Lot: [id number]   NDC: [address]-6

## 2020-04-20 ENCOUNTER — Ambulatory Visit: Payer: BLUE CROSS/BLUE SHIELD | Attending: Internal Medicine

## 2020-04-20 DIAGNOSIS — Z23 Encounter for immunization: Secondary | ICD-10-CM

## 2020-04-20 NOTE — Progress Notes (Signed)
   Covid-19 Vaccination Clinic  Name:  Robin Robles    MRN: [id number] DOB: 11-29-1956  04/20/2020  [name] was observed post Covid-19 immunization for 15 minutes without incident. She was provided with Vaccine Information Sheet and instruction to access the V-Safe system.   [name] was instructed to call 911 with any severe reactions post vaccine: Marland Kitchen Difficulty breathing  . Swelling of face and throat  . A fast heartbeat  . A bad rash all over body  . Dizziness and weakness   Immunizations Administered    Name Date Dose VIS Date Route   Pfizer COVID-19 Vaccine 04/20/2020  8:27 AM 0.3 mL 02/18/2019 Intramuscular   Manufacturer: [employer], [employer]   Lot: [id number]   NDC: [address]-1

## 2020-05-10 ENCOUNTER — Other Ambulatory Visit: Payer: Self-pay

## 2020-05-10 ENCOUNTER — Ambulatory Visit (HOSPITAL_COMMUNITY)
Admission: EM | Admit: 2020-05-10 | Discharge: 2020-05-10 | Disposition: A | Discharge status: Home / Self Care | Payer: Medicare Other | Attending: Urgent Care | Admitting: Urgent Care

## 2020-05-10 ENCOUNTER — Encounter (HOSPITAL_COMMUNITY): Payer: Self-pay

## 2020-05-10 DIAGNOSIS — R32 Unspecified urinary incontinence: Secondary | ICD-10-CM | POA: Insufficient documentation

## 2020-05-10 DIAGNOSIS — R1031 Right lower quadrant pain: Secondary | ICD-10-CM | POA: Diagnosis present

## 2020-05-10 DIAGNOSIS — M545 Low back pain, unspecified: Secondary | ICD-10-CM

## 2020-05-10 LAB — POCT URINALYSIS DIP (DEVICE)
Bilirubin Urine: NEGATIVE
Glucose, UA: NEGATIVE mg/dL
Hgb urine dipstick: NEGATIVE
Ketones, ur: NEGATIVE mg/dL
Leukocytes,Ua: NEGATIVE
Nitrite: NEGATIVE
Protein, ur: NEGATIVE mg/dL
Specific Gravity, Urine: 1.02 (ref 1.005–1.030)
Urobilinogen, UA: 0.2 mg/dL (ref 0.0–1.0)
pH: 7.5 (ref 5.0–8.0)

## 2020-05-10 MED ORDER — MELOXICAM 15 MG PO TABS: 15.0000 mg | ORAL_TABLET | Freq: Every day | ORAL | 0 refills | Status: DC

## 2020-05-10 NOTE — ED Provider Notes (Signed)
[place]   MRN: [id number] DOB: 1956-08-04  Subjective:   Robin Robles is a 64 y.o. female presenting for persistent urinary incontinence over the past 3 to 4 months.  She has also had right sided pelvic pains over the same timeframe, feels transient moderate sharp pains.  Started to have right-sided low back pain that is focal.  Uses Lyrica at bedtime.  No longer takes tramadol.  She does have a gynecologist but has not gotten a consult with them.  Denies fever, nausea, vomiting, falls, trauma, weakness, hematuria, dysuria.  Of note, patient has previously had episodes of this right lower quadrant pain that has been undifferentiated with negative work-up.  She also has a history of facet arthropathy at the level of L5-S1, history of right renal calculus, last seen on CT scan of 10/23/2018.  No current facility-administered medications for this encounter.  Current Outpatient Medications:  .  buPROPion (WELLBUTRIN XL) 150 MG 24 hr tablet, Take by mouth., Disp: , Rfl:  .  pregabalin (LYRICA) 50 MG capsule, Take 100 mg by mouth at bedtime. , Disp: , Rfl:  .  propranolol (INDERAL) 40 MG tablet, Take 40 mg by mouth every evening., Disp: , Rfl:  .  sertraline (ZOLOFT) 100 MG tablet, Take 200 mg by mouth every morning. , Disp: , Rfl:  .  topiramate (TOPAMAX) 25 MG tablet, Take 50 mg by mouth at bedtime. , Disp: , Rfl:  .  diclofenac (VOLTAREN) 75 MG EC tablet, Take 75 mg by mouth 2 (two) times daily., Disp: , Rfl:  .  dicyclomine (BENTYL) 20 MG tablet, Take 1 tablet (20 mg total) by mouth 2 (two) times daily., Disp: 20 tablet, Rfl: 0 .  EPINEPHrine 0.3 mg/0.3 mL IJ SOAJ injection, Inject into the muscle., Disp: , Rfl:  .  famotidine (PEPCID) 20 MG tablet, TAKE 1 TABLET BY MOUTH AT BEDTIME (Patient not taking: Reported on 10/23/2018), Disp: 30 tablet, Rfl: 2 .  pantoprazole (PROTONIX) 40 MG tablet, TAKE 1 TABLET BY MOUTH DAILY TAKE 30 TO 60 MIN BEFORE FIRST MEAL OF DAY (Patient not  taking: Reported on 10/23/2018), Disp: 30 tablet, Rfl: 2 .  traMADol (ULTRAM) 50 MG tablet, 1-2 every 4 hours as needed for cough or pain (Patient not taking: Reported on 10/23/2018), Disp: 40 tablet, Rfl: 0   Allergies  Allergen Reactions  . Bee Venom Anaphylaxis    Throat swelling  Throat swelling   . Codeine Other (See Comments)    hallucinations Other reaction(s): Delusions (intolerance), Hallucinations  . Oxycodone-Acetaminophen Anxiety    Other reaction(s): Other, Other (See Comments) hyper hyper     Past Medical History:  Diagnosis Date  . Fibromyalgia   . Hypertension   . Migraines      Past Surgical History:  Procedure Laterality Date  . ABDOMINAL HYSTERECTOMY    . CHOLECYSTECTOMY    . THYROID SURGERY      Family History  Problem Relation Age of Onset  . Heart failure Mother   . Heart failure Father     Social History   Tobacco Use  . Smoking status: Never Smoker  . Smokeless tobacco: Never Used  Substance Use Topics  . Alcohol use: No  . Drug use: No    ROS   Objective:   Vitals: BP 127/82 (BP Location: Left Arm)   Pulse 76   Temp 98.8 F (37.1 C) (Oral)   Resp 16   SpO2 98%   Physical Exam Constitutional:  General: She is not in acute distress.    Appearance: Normal appearance. She is well-developed. She is not ill-appearing, toxic-appearing or diaphoretic.  HENT:     Head: Normocephalic and atraumatic.     Right Ear: External ear normal.     Left Ear: External ear normal.     Nose: Nose normal.     Mouth/Throat:     Mouth: Mucous membranes are moist.     Pharynx: Oropharynx is clear.  Eyes:     General: No scleral icterus.       Right eye: No discharge.        Left eye: No discharge.     Extraocular Movements: Extraocular movements intact.     Pupils: Pupils are equal, round, and reactive to light.  Cardiovascular:     Rate and Rhythm: Normal rate.  Pulmonary:     Effort: Pulmonary effort is normal.  Abdominal:      General: Bowel sounds are normal. There is no distension.     Palpations: Abdomen is soft. There is no mass.     Tenderness: There is abdominal tenderness in the right lower quadrant. There is no right CVA tenderness, left CVA tenderness, guarding or rebound. Negative signs include Murphy's sign and McBurney's sign.  Musculoskeletal:     Lumbar back: Tenderness present. No swelling, edema, deformity, signs of trauma, lacerations, spasms or bony tenderness. Normal range of motion. Negative right straight leg raise test and negative left straight leg raise test. No scoliosis.       Back:     Comments: Ambulates without assistance and anticipated space.   Skin:    General: Skin is warm and dry.  Neurological:     General: No focal deficit present.     Mental Status: She is alert and oriented to person, place, and time.  Psychiatric:        Mood and Affect: Mood normal.        Behavior: Behavior normal.        Thought Content: Thought content normal.        Judgment: Judgment normal.    Results for orders placed or performed during the hospital encounter of 05/10/20 (from the past 24 hour(s))  POCT urinalysis dip (device)     Status: None   Collection Time: 05/10/20  3:25 PM  Result Value Ref Range   Glucose, UA NEGATIVE NEGATIVE mg/dL   Bilirubin Urine NEGATIVE NEGATIVE   Ketones, ur NEGATIVE NEGATIVE mg/dL   Specific Gravity, Urine 1.020 1.005 - 1.030   Hgb urine dipstick NEGATIVE NEGATIVE   pH 7.5 5.0 - 8.0   Protein, ur NEGATIVE NEGATIVE mg/dL   Urobilinogen, UA 0.2 0.0 - 1.0 mg/dL   Nitrite NEGATIVE NEGATIVE   Leukocytes,Ua NEGATIVE NEGATIVE    Assessment and Plan :   PDMP not reviewed this encounter.  1. Urinary incontinence, unspecified type   2. RLQ abdominal pain   3. Acute right-sided low back pain without sciatica     Patient has recurrent RLQ/pelvic pain that needs repeat imaging including CT scan, pelvic and/or transvaginal U/S. No sign of UTI, recurrent kidney  stone. Counseled that she may have gynecologic source of her incontinence such as pelvic floor weakening and would benefit from an appt with her gynecologist. Also provided her with info to urology. Has a hx of fibromyalgia and recommended she maintain her Lyrica, use meloxicam or diclofenac for pains. Given no trauma, falls, held off from x-rays in clinic. Counseled patient on potential  for adverse effects with medications prescribed/recommended today, ER and return-to-clinic precautions discussed, patient verbalized understanding.    Wallis Bamberg, PA-C 05/10/20 1624

## 2020-05-10 NOTE — ED Triage Notes (Signed)
Pt c/o acute onset low back and lower abdom pain as well as inability to control bladder for approx 3-4 months.  Denies fever, chills, n/v.   Also c/o acute onset pain/swelling/redness to left eye upon waking this morning.

## 2020-05-10 NOTE — Discharge Instructions (Addendum)
Please make sure you follow up with your PCP for a repeat CT scan for kidney stone or other intra-abdominal process. Make sure you also follow up with your gynecologist for consult on your incontinence and possible pelvic floor weakening. Your PCP may also refer you to an urologist for this. For now, avoid urinary irritants like alcohol, caffeine, tea. Hydrate with plain water.

## 2020-05-11 LAB — URINE CULTURE

## 2020-05-30 ENCOUNTER — Other Ambulatory Visit: Payer: Self-pay

## 2020-05-30 ENCOUNTER — Emergency Department (HOSPITAL_COMMUNITY)
Admission: EM | Admit: 2020-05-30 | Discharge: 2020-05-30 | Disposition: A | Discharge status: Home / Self Care | Payer: Medicare Other | Attending: Emergency Medicine | Admitting: Emergency Medicine

## 2020-05-30 ENCOUNTER — Encounter (HOSPITAL_COMMUNITY): Payer: Self-pay | Admitting: Emergency Medicine

## 2020-05-30 ENCOUNTER — Emergency Department (HOSPITAL_COMMUNITY): Payer: Medicare Other

## 2020-05-30 DIAGNOSIS — I10 Essential (primary) hypertension: Secondary | ICD-10-CM | POA: Insufficient documentation

## 2020-05-30 DIAGNOSIS — M545 Low back pain, unspecified: Secondary | ICD-10-CM

## 2020-05-30 DIAGNOSIS — Z79899 Other long term (current) drug therapy: Secondary | ICD-10-CM | POA: Diagnosis not present

## 2020-05-30 DIAGNOSIS — M79604 Pain in right leg: Secondary | ICD-10-CM | POA: Diagnosis not present

## 2020-05-30 DIAGNOSIS — M549 Dorsalgia, unspecified: Secondary | ICD-10-CM | POA: Diagnosis present

## 2020-05-30 MED ORDER — CYCLOBENZAPRINE HCL 10 MG PO TABS: 10.0000 mg | ORAL_TABLET | Freq: Two times a day (BID) | ORAL | 0 refills | Status: AC | PRN

## 2020-05-30 MED ORDER — KETOROLAC TROMETHAMINE 15 MG/ML IJ SOLN
15.0000 mg | Freq: Once | INTRAMUSCULAR | Status: AC
  Administered 2020-05-30: 15 mg via INTRAMUSCULAR
  Filled 2020-05-30: qty 1

## 2020-05-30 MED ORDER — TIZANIDINE HCL 4 MG PO TABS
2.0000 mg | ORAL_TABLET | Freq: Once | ORAL | Status: AC
  Administered 2020-05-30: 2 mg via ORAL
  Filled 2020-05-30: qty 1

## 2020-05-30 MED ORDER — LIDOCAINE 5 % EX PTCH
1.0000 | MEDICATED_PATCH | Freq: Once | CUTANEOUS | Status: DC
  Administered 2020-05-30: 1 via TRANSDERMAL
  Filled 2020-05-30: qty 1

## 2020-05-30 NOTE — ED Provider Notes (Signed)
[facility] EMERGENCY DEPARTMENT Provider Note   CSN: [id number] Arrival date & time: 05/30/20  2706     History Chief Complaint  Patient presents with  . Back Pain    Robin Robles is a 64 y.o. female with past medical history significant for fibromyalgia, hypertension, migraines presents to emergency department today with chief complaint of progressively worsening back pain x3 weeks.  Pain is located in her right lower back.  She states it radiates down posterior right leg.  She states the pain is sharp and shooting.  Pain is intermittent.  She rates the pain 8 out of 10 in severity.  She denies history of similar pain.  She states she has been seen by urgent care, PCP and chiropractor.  PCP did CT renal that was negative for kidney stone.  PCP also put her on steroid taper without any symptom improvement.  She went to a chiropractor last week and had x-rays performed however results will not be available until next week.   Patient states she had a mechanical fall x 6 weeks ago when she fell forward and landed on her knees.  She denies any loss of consciousness or head injury during the fall.  She has been ambulatory since the fall but states it is painful to walk.  Her current medications include Lyrica and diclofenac which she does not feel are helping her pain. Patient is not anticoagulated. Denies fevers, weight loss, numbness/weakness of upper and lower extremities, bowel/bladder incontinence, urinary retention, history of cancer, saddle anesthesia, history of back surgery, history of IVDA.  Past Medical History:  Diagnosis Date  . Fibromyalgia   . Hypertension   . Migraines     Patient Active Problem List   Diagnosis Date Noted  . Upper airway cough syndrome 07/31/2017    Past Surgical History:  Procedure Laterality Date  . ABDOMINAL HYSTERECTOMY    . CHOLECYSTECTOMY    . THYROID SURGERY       OB History   No obstetric history on file.     Family  History  Problem Relation Age of Onset  . Heart failure Mother   . Heart failure Father     Social History   Tobacco Use  . Smoking status: Never Smoker  . Smokeless tobacco: Never Used  Substance Use Topics  . Alcohol use: No  . Drug use: No    Home Medications Prior to Admission medications   Medication Sig Start Date End Date Taking? Authorizing Provider  buPROPion (WELLBUTRIN XL) 150 MG 24 hr tablet Take by mouth. 05/07/20   [provider]  cyclobenzaprine (FLEXERIL) 10 MG tablet Take 1 tablet (10 mg total) by mouth 2 (two) times daily as needed for up to 7 days for muscle spasms. 05/30/20 06/06/20  Aedon Deason, Caroleen Hamman, PA-C  diclofenac (VOLTAREN) 75 MG EC tablet Take 75 mg by mouth 2 (two) times daily.    [provider]  dicyclomine (BENTYL) 20 MG tablet Take 1 tablet (20 mg total) by mouth 2 (two) times daily. 10/23/18   Mortis, Jerrel Ivory I, PA-C  EPINEPHrine 0.3 mg/0.3 mL IJ SOAJ injection Inject into the muscle. 06/06/17   [provider]  famotidine (PEPCID) 20 MG tablet TAKE 1 TABLET BY MOUTH AT BEDTIME Patient not taking: Reported on 10/23/2018 10/30/17   Nyoka Cowden, MD  meloxicam (MOBIC) 15 MG tablet Take 1 tablet (15 mg total) by mouth daily. 05/10/20   Wallis Bamberg, PA-C  pantoprazole (PROTONIX) 40 MG  tablet TAKE 1 TABLET BY MOUTH DAILY TAKE 30 TO 60 MIN BEFORE FIRST MEAL OF DAY Patient not taking: Reported on 10/23/2018 10/30/17   Nyoka Cowden, MD  pregabalin (LYRICA) 50 MG capsule Take 100 mg by mouth at bedtime.     [provider]  propranolol (INDERAL) 40 MG tablet Take 40 mg by mouth every evening.    [provider]  sertraline (ZOLOFT) 100 MG tablet Take 200 mg by mouth every morning.     [provider]  topiramate (TOPAMAX) 25 MG tablet Take 50 mg by mouth at bedtime.     [provider]  traMADol (ULTRAM) 50 MG tablet 1-2 every 4 hours as needed for cough or pain Patient not taking: Reported on  10/23/2018 07/31/17   Nyoka Cowden, MD    Allergies    Bee venom, Codeine, and Oxycodone-acetaminophen  Review of Systems   Review of Systems All other systems are reviewed and are negative for acute change except as noted in the HPI.  Physical Exam Updated Vital Signs BP 107/70 (BP Location: Right Arm)   Pulse 72   Temp 98.7 F (37.1 C) (Oral)   Resp 16   SpO2 97%   Physical Exam Vitals and nursing note reviewed.  Constitutional:      Appearance: She is well-developed. She is not ill-appearing or toxic-appearing.  HENT:     Head: Normocephalic and atraumatic.     Nose: Nose normal.  Eyes:     General: No scleral icterus.       Right eye: No discharge.        Left eye: No discharge.     Conjunctiva/sclera: Conjunctivae normal.  Neck:     Vascular: No JVD.  Cardiovascular:     Rate and Rhythm: Normal rate and regular rhythm.     Pulses: Normal pulses.     Heart sounds: Normal heart sounds.  Pulmonary:     Effort: Pulmonary effort is normal.     Breath sounds: Normal breath sounds.  Abdominal:     General: There is no distension.  Musculoskeletal:        General: Normal range of motion.     Cervical back: Normal range of motion.       Back:     Comments: Tenderness to deep palpation as depicted in image above.  No overlying skin changes.  No midline tenderness.  No step-offs or deformity.  Positive straight leg raise test on the right.  Full range of motion of lumbar spine.  Moving all extremities without signs of injury  Skin:    General: Skin is warm and dry.  Neurological:     Mental Status: She is oriented to person, place, and time.     GCS: GCS eye subscore is 4. GCS verbal subscore is 5. GCS motor subscore is 6.     Comments: Sensation grossly intact to light touch in the lower extremities bilaterally. No saddle anesthesias. Strength 5/5 with flexion and extension at the bilateral hips, knees, and ankles. No noted gait deficit. Coordination intact with  heel to shin testing.  Psychiatric:        Behavior: Behavior normal.     ED Results / Procedures / Treatments   Labs (all labs ordered are listed, but only abnormal results are displayed) Labs Reviewed - No data to display  EKG None  Radiology DG Lumbar Spine Complete  Result Date: 05/30/2020 CLINICAL DATA:  Neck pain. Patient fell in the  forward direction by the 6 weeks ago. Increasing LOWER back pain for 3 weeks. EXAM: LUMBAR SPINE - COMPLETE 4+ VIEW COMPARISON:  04/09/2009 CT FINDINGS: Normal alignment. There is mild degenerative change in the lumbar spine, with disc height loss and uncovertebral spurring primarily at L2-3. No acute fracture or traumatic subluxation. There is mild atherosclerotic calcification of the abdominal aorta. Surgical clips are present in the UPPER abdomen. Bowel gas pattern is nonobstructive. IMPRESSION: No evidence for acute abnormality. Electronically Signed   By: Nolon Nations M.D.   On: 05/30/2020 11:35    Procedures Procedures (including critical care time)  Medications Ordered in ED Medications  lidocaine (LIDODERM) 5 % 1 patch (1 patch Transdermal Patch Applied 05/30/20 1022)  ketorolac (TORADOL) 15 MG/ML injection 15 mg (15 mg Intramuscular Given 05/30/20 1022)  tiZANidine (ZANAFLEX) tablet 2 mg (2 mg Oral Given 05/30/20 1022)    ED Course  I have reviewed the triage vital signs and the nursing notes.  Pertinent labs & imaging results that were available during my care of the patient were reviewed by me and considered in my medical decision making (see chart for details).    MDM Rules/Calculators/A&P                      64 yo female with back pain.  No neurological deficits and normal neuro exam.  Patient can walk but states is painful.  No loss of bowel or bladder control.  No concern for cauda equina.  No fever, night sweats, weight loss, h/o cancer, IVDU. Xray of lumbar spine shows degenerative change in the lumbar spine, no acute  abnormalities.  Imaging was repeated today since I am unable to see results of her outpatient x-ray by chiropractor.  Patient has no history of abnormal kidney function.  I viewed labs and creatinines have been within normal range in the past.  Will give one-time dose of IM Toradol, Zanaflex and lidocaine patch. Will discharge with symptomatic treatment including muscle relaxer and recommend OTC Voltaren gel.   The patient appears reasonably screened and/or stabilized for discharge and I doubt any other medical condition or other [facility] requiring further screening, evaluation, or treatment in the ED at this time prior to discharge. The patient is safe for discharge with strict return precautions discussed. Recommend neurosurgery follow-up if pain persists.   Portions of this note were generated with [employer]. Dictation errors may occur despite best attempts at proofreading.   Final Clinical Impression(s) / ED Diagnoses Final diagnoses:  Acute right-sided low back pain, unspecified whether sciatica present    Rx / DC Orders ED Discharge Orders         Ordered    cyclobenzaprine (FLEXERIL) 10 MG tablet  2 times daily PRN     05/30/20 1208           Cherre Robins, PA-C 05/30/20 1213    Sherwood Gambler, MD 06/02/20 [phone]

## 2020-05-30 NOTE — ED Notes (Signed)
Pt verbalized understanding of discharge instructions. Follow up care and prescriptions reviewed, pt had no further questions. 

## 2020-05-30 NOTE — ED Notes (Signed)
Pt ambulatory to RR 

## 2020-05-30 NOTE — ED Notes (Signed)
Pt transported to xray 

## 2020-05-30 NOTE — Discharge Instructions (Signed)
You have been seen today for back pain. Please read and follow all provided instructions. Return to the emergency room for worsening condition or new concerning symptoms.    Your x-ray today did not show any broken bones or locations.  There is degenerative disc changes which could be the cause of your pain and require further outpatient testing by specialists  1. Medications:  Prescription sent to your pharmacy for Flexeril.  This is a muscle relaxer.  Please take as prescribed.  It can make you drowsy so do not drive or work when taking. -Also recommend you try over-the-counter Voltaren gel. You can ask the pharmacists if you have any questions about use  Continue usual home medications Take medications as prescribed. Please review all of the medicines and only take them if you do not have an allergy to them.   2. Treatment: rest, drink plenty of fluids. Try sitting on a donut pillow for comfort.  3. Follow Up:  Please follow up with the on-call neurosurgeon Dr. Maurice Small.  I have given you his office information.  When you call you should mention this is an emergency room follow-up visit.   It is also a possibility that you have an allergic reaction to any of the medicines that you have been prescribed - Everybody reacts differently to medications and while MOST people have no trouble with most medicines, you may have a reaction such as nausea, vomiting, rash, swelling, shortness of breath. If this is the case, please stop taking the medicine immediately and contact your physician.  ?

## 2020-05-30 NOTE — ED Notes (Signed)
ED Provider at bedside. 

## 2020-05-30 NOTE — ED Triage Notes (Signed)
P[t. Stated, I fell 6 weeks ago and fell forward. Im having pain in my lower back into my butt, this started about 2-3 weeks ago. If I stand very long I can't stand it. They did a CT scan to see if it was kidney stones and it was negative.

## 2020-06-27 IMAGING — CT CT RENAL STONE PROTOCOL
2 of 4 series · 16 of 46 positions shown, 18 images · non-contrast
Comparison: 03/04/2018

CLINICAL DATA: Low abdominal pain since 1 p.m. today.

EXAM:
CT ABDOMEN AND PELVIS WITHOUT CONTRAST
TECHNIQUE: Multidetector CT imaging of the abdomen and pelvis was performed
following the standard protocol without IV contrast.

[Series 2: axial st · axial · 0.81mm/px · z∈[-622,-182]mm · 13 of 100 slices shown, 15 images]
[im 6/100  soft-tissue]
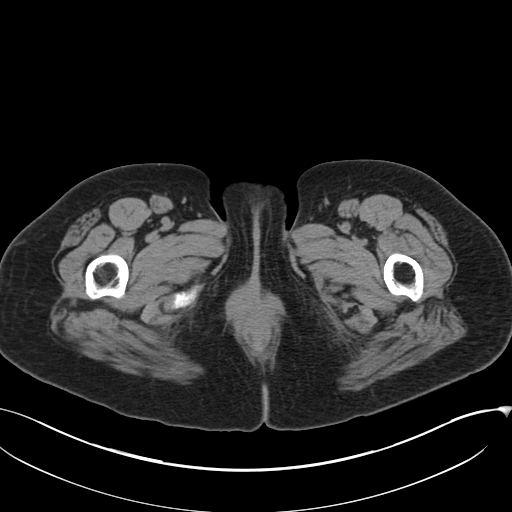
[im 6/100  bone]
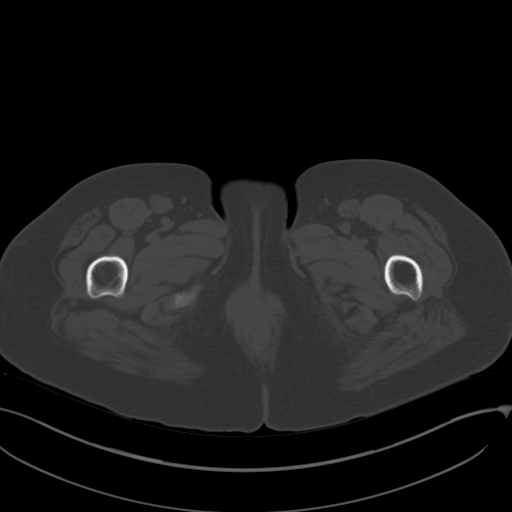
[im 12/100  soft-tissue]
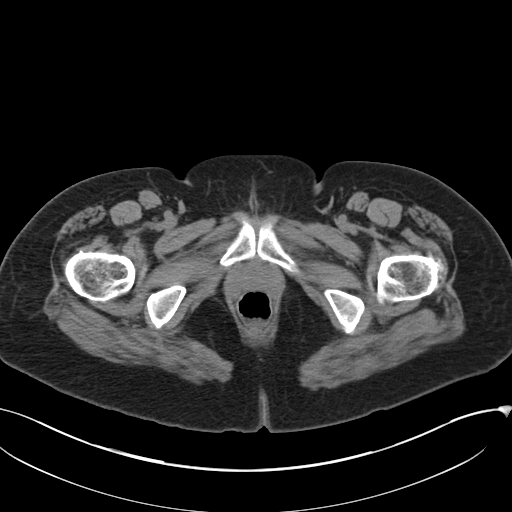
[im 23/100  soft-tissue]
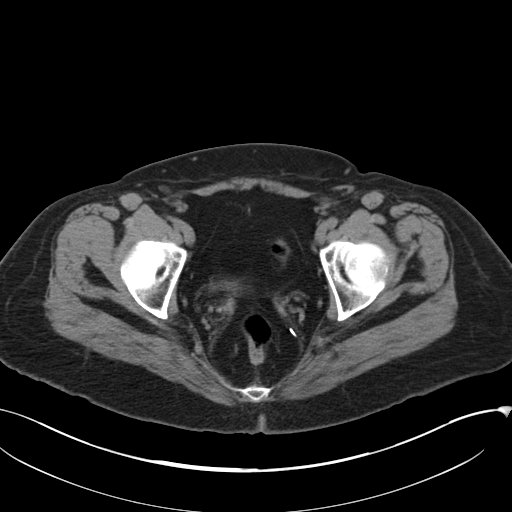
[im 28/100  soft-tissue]
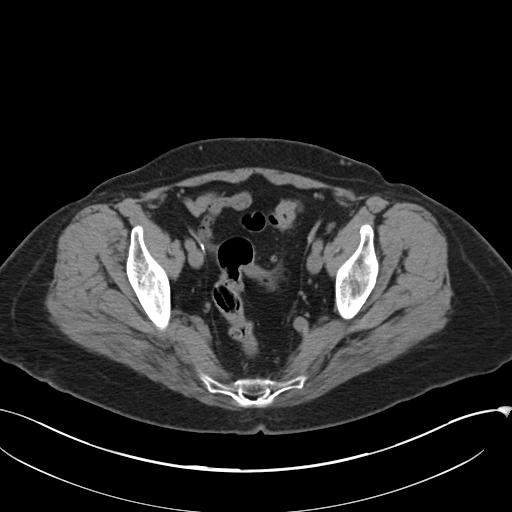
[im 34/100  soft-tissue]
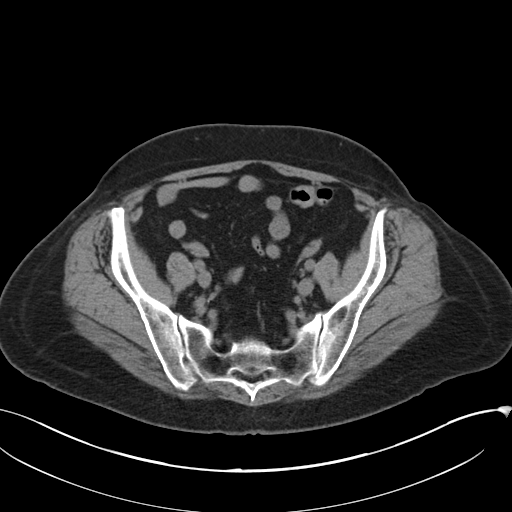
[im 45/100  soft-tissue]
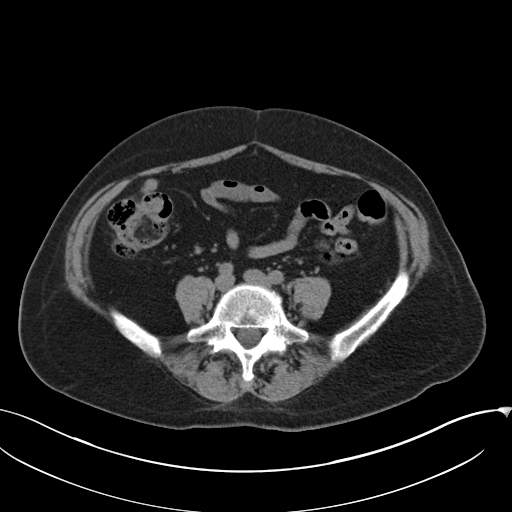
[im 50/100  soft-tissue]
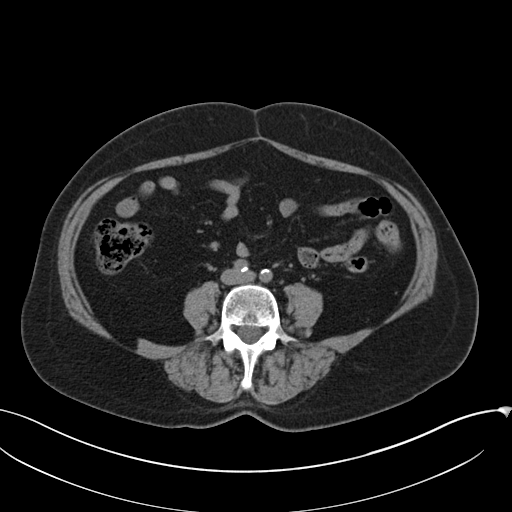
[im 56/100  soft-tissue]
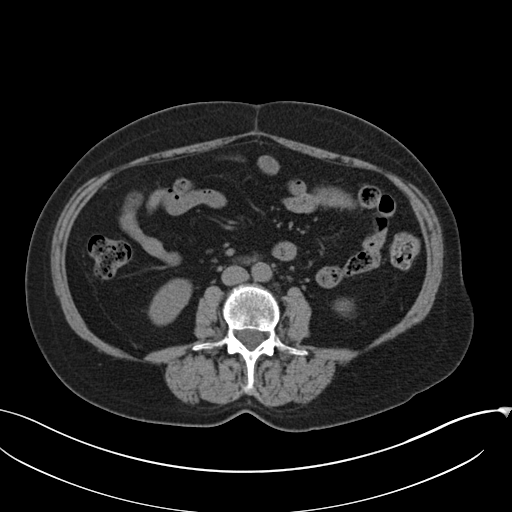
[im 67/100  soft-tissue]
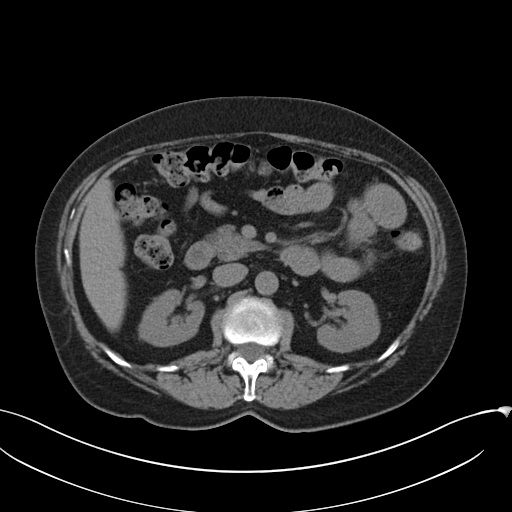
[im 67/100  bone]
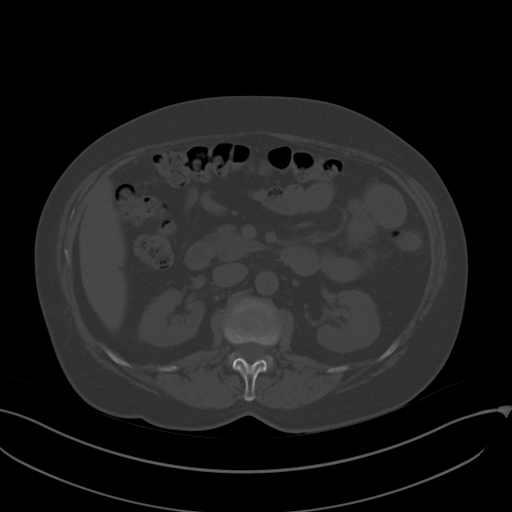
[im 72/100  soft-tissue]
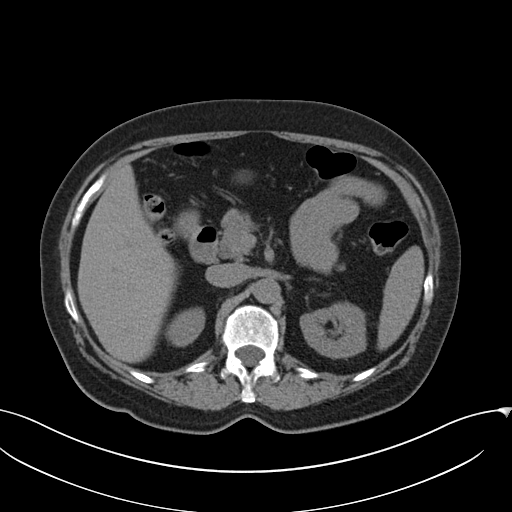
[im 78/100  soft-tissue]
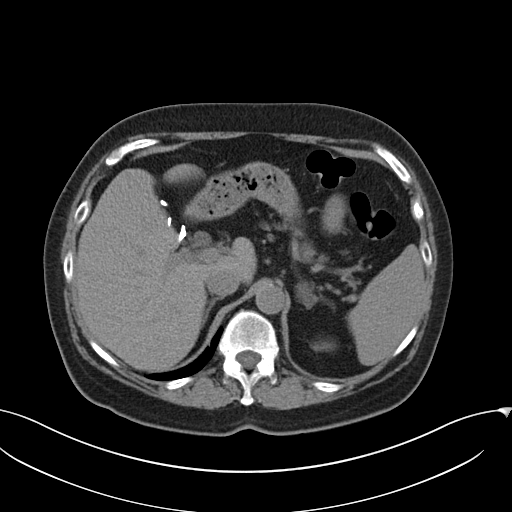
[im 89/100  soft-tissue]
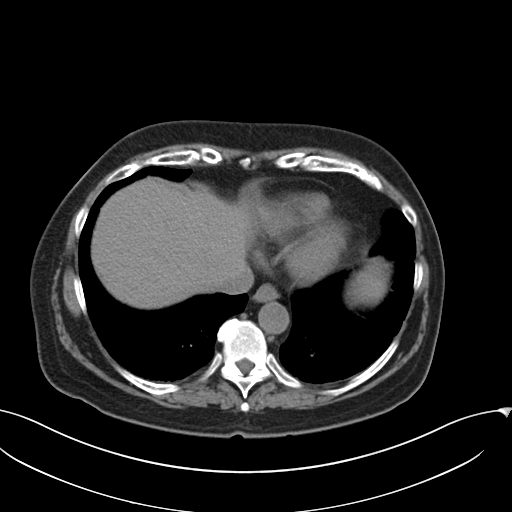
[im 94/100  soft-tissue]
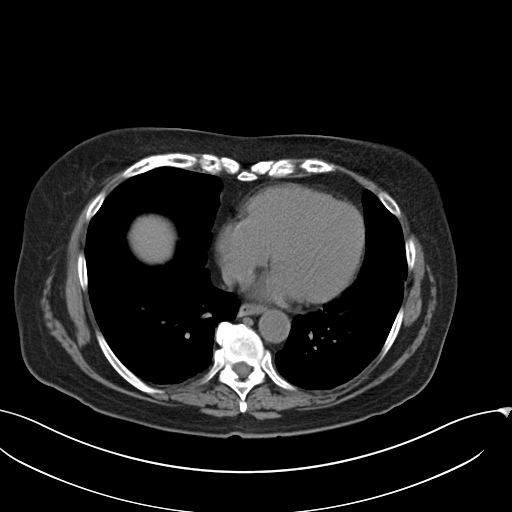

[Series 5: coronal · coronal · 0.76mm/px · 3 of 143 slices shown]
[im 48/143  soft-tissue]
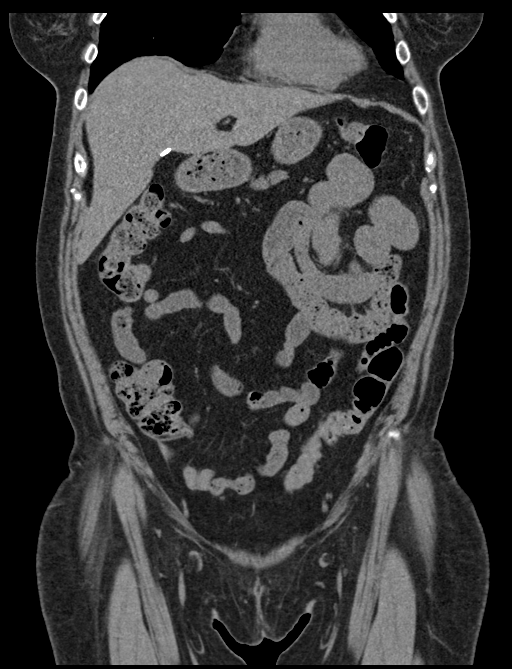
[im 64/143  soft-tissue]
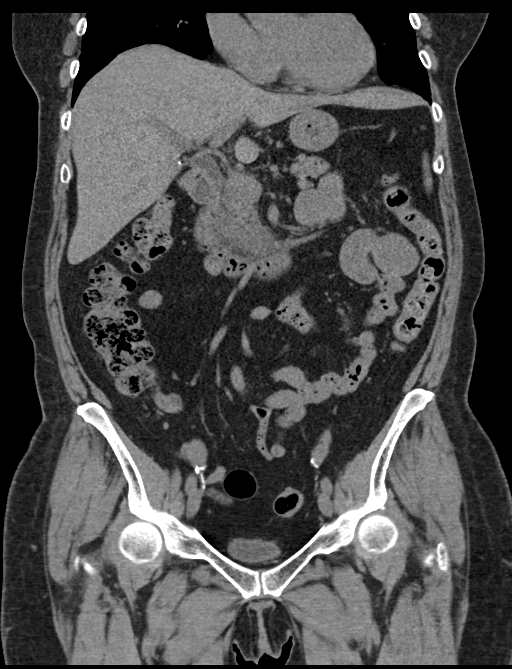
[im 79/143  soft-tissue]
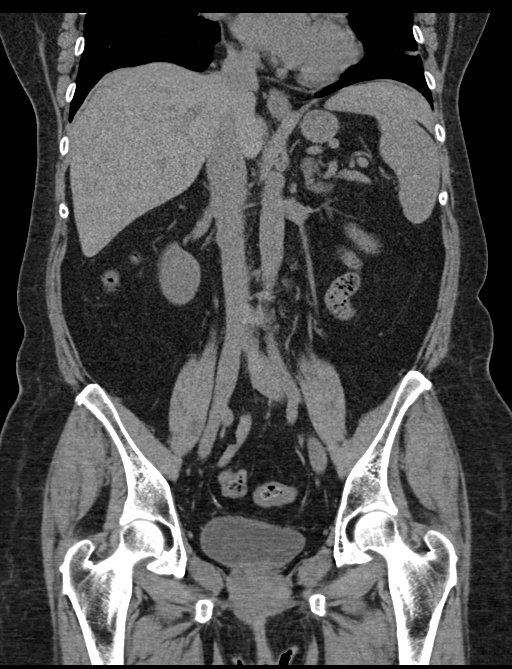

[16 of 46 positions shown; findings below may reference images not displayed]

FINDINGS: Lower chest: Mild bibasilar atelectasis.

Hepatobiliary: No focal liver abnormality is seen. Status post
cholecystectomy. No biliary dilatation.

Pancreas: Unremarkable. No pancreatic ductal dilatation or
surrounding inflammatory changes.

Spleen: Normal in size without focal abnormality.

Adrenals/Urinary Tract: 2.2 cm hypodense left adrenal mass measuring
-5 Hounsfield units most consistent with an adrenal adenoma. Normal
right adrenal gland. Punctate nonobstructing right renal calculus.
No left renal calculus. No ureteral calculi. No bladder calculi.

Stomach/Bowel: Stomach is within normal limits. Appendix appears
normal. No evidence of bowel wall thickening, distention, or
inflammatory changes. Sigmoid diverticulosis without evidence of
diverticulitis.

Vascular/Lymphatic: Normal caliber abdominal aorta with
atherosclerosis.

Reproductive: Status post hysterectomy. No adnexal masses.

Other: No abdominal wall hernia or abnormality. No abdominopelvic
ascites.

Musculoskeletal: No acute or significant osseous findings. Moderate
right facet arthropathy at L5-S1.
IMPRESSION: 1. No acute abdominal or pelvic pathology.
2. Punctate nonobstructing right renal calculus.
3. Left adrenal mass unchanged from 03/04/2018. Significant interval
increase in size compared with 04/09/2009. By density measurements
the masses most consistent with an adrenal adenoma. Given the
overall increase in size compared with 04/09/2009, follow-up MRI of
the abdomen in 6 months could be performed to evaluate ongoing
stability.

## 2021-02-15 ENCOUNTER — Ambulatory Visit: Payer: Medicare Other | Admitting: Dermatology

## 2021-02-21 ENCOUNTER — Ambulatory Visit: Payer: Medicare Other | Admitting: Dermatology

## 2021-10-05 NOTE — Progress Notes (Signed)
[facility] - Clinic Note  10/10/2021     CHIEF COMPLAINT Patient presents for Retina Evaluation   HISTORY OF PRESENT ILLNESS: Robin Robles is a 65 y.o. female who presents to the clinic today for:   HPI     Retina Evaluation   In both eyes.  I, the attending physician,  performed the HPI with the patient and updated documentation appropriately.        Comments   Retina eval per Dr. Marchelle Gearing for early ARMD OU-  Patient states 6-7 years ago she was seen by an optometrist.  He told her she had early stage of ARMD.  Around the same time she seen Dr. Luciana Axe and was told she did not have this nor would she ever have it.   During her visit with Dr. Dione Booze he asked her if she was still seeing Dr. Luciana Axe for her ARMD (Dr. Luciana Axe had told her she didn't have this).  She would like a second opinion. Her mother had ARMD and central vision went blind.          Last edited by Rennis Chris, MD on 10/10/2021  5:09 PM.    Pt is here on the referral of Dr. Marchelle Gearing for concern of ARMD OU, pt states 6-8 years ago she saw an optometrist who told her she had macular degeneration, pt was referred to Dr. Luciana Axe who told her she did not have ARMD and probably would never get it, pt states she only saw him one other time for a PVD, pt states when she saw Dr. Dione Booze, he asked if she was still seeing Dr. Luciana Axe, but pt was unaware she needed to follow up for this problem, pt states she is worried bc her mother had exu ARMD and received injections, pt states she has not seen [facility] in 6 years, pt has had cataract sx with Dr. Dione Booze, pt is taking AREDS 2  Referring physician: Sallye Lat, MD [address] [place],  [place] [address]  HISTORICAL INFORMATION:   Selected notes from the MEDICAL RECORD NUMBER Referred by Dr. Marchelle Gearing for eval of ARMD OU LEE: 04.27.22 Ocular Hx-ARMD, Pseudo OU PMH-    CURRENT MEDICATIONS: No current outpatient medications on  file. (Ophthalmic Drugs)   No current facility-administered medications for this visit. (Ophthalmic Drugs)   Current Outpatient Medications (Other)  Medication Sig   buPROPion (WELLBUTRIN XL) 150 MG 24 hr tablet Take by mouth.   dicyclomine (BENTYL) 20 MG tablet Take 1 tablet (20 mg total) by mouth 2 (two) times daily.   EPINEPHrine 0.3 mg/0.3 mL IJ SOAJ injection Inject into the muscle.   omeprazole (PRILOSEC) 20 MG capsule Take 20 mg by mouth daily. As needed   pregabalin (LYRICA) 50 MG capsule Take 100 mg by mouth at bedtime.    propranolol (INDERAL) 40 MG tablet Take 40 mg by mouth every evening.   sertraline (ZOLOFT) 100 MG tablet Take 200 mg by mouth every morning.    topiramate (TOPAMAX) 25 MG tablet Take 50 mg by mouth at bedtime.    diclofenac (VOLTAREN) 75 MG EC tablet Take 75 mg by mouth 2 (two) times daily.   famotidine (PEPCID) 20 MG tablet TAKE 1 TABLET BY MOUTH AT BEDTIME (Patient not taking: Reported on 10/23/2018)   meloxicam (MOBIC) 15 MG tablet Take 1 tablet (15 mg total) by mouth daily.   pantoprazole (PROTONIX) 40 MG tablet TAKE 1 TABLET BY MOUTH DAILY TAKE  30 TO 60 MIN BEFORE FIRST MEAL OF DAY (Patient not taking: Reported on 10/23/2018)   traMADol (ULTRAM) 50 MG tablet 1-2 every 4 hours as needed for cough or pain (Patient not taking: Reported on 10/23/2018)   No current facility-administered medications for this visit. (Other)   REVIEW OF SYSTEMS: ROS   Positive for: Neurological, Musculoskeletal, Eyes Negative for: Constitutional, Gastrointestinal, Skin, Genitourinary, HENT, Endocrine, Cardiovascular, Respiratory, Psychiatric, Allergic/Imm, Heme/Lymph Last edited by Joni Reining, COA on 10/10/2021  2:12 PM.    ALLERGIES Allergies  Allergen Reactions   Bee Venom Anaphylaxis    Throat swelling  Throat swelling    Codeine Other (See Comments)    hallucinations Other reaction(s): Delusions (intolerance), Hallucinations   Oxycodone-Acetaminophen Anxiety     Other reaction(s): Other, Other (See Comments) hyper hyper    PAST MEDICAL HISTORY Past Medical History:  Diagnosis Date   Fibromyalgia    Hypertension    Migraines    Past Surgical History:  Procedure Laterality Date   ABDOMINAL HYSTERECTOMY     CHOLECYSTECTOMY     THYROID SURGERY     FAMILY HISTORY Family History  Problem Relation Age of Onset   Heart failure Mother    Heart failure Father    SOCIAL HISTORY Social History   Tobacco Use   Smoking status: Never   Smokeless tobacco: Never  Vaping Use   Vaping Use: Never used  Substance Use Topics   Alcohol use: No   Drug use: No       OPHTHALMIC EXAM: Base Eye Exam     Visual Acuity (Snellen - Linear)       Right Left   Dist cc 20/20 20/25   Dist ph cc  NI    Correction: Glasses         Tonometry (Tonopen, 2:23 PM)       Right Left   Pressure 14 12         Pupils       Dark Light Shape React APD   Right 4 3 Round Brisk None   Left 4 3 Round Brisk None         Visual Fields (Counting fingers)       Left Right    Full Full         Extraocular Movement       Right Left    Full Full         Neuro/Psych     Oriented x3: Yes   Mood/Affect: Normal         Dilation     Both eyes: 1.0% Mydriacyl, 2.5% Phenylephrine @ 2:23 PM           Slit Lamp and Fundus Exam     External Exam       Right Left   External Normal Normal         Slit Lamp Exam       Right Left   Lids/Lashes Dermatochalasis - upper lid, mild MGD Dermatochalasis - upper lid, mild MGD   Conjunctiva/Sclera White and quiet White and quiet   Cornea mild arcus, well healed cataract wound mild arcus, well healed cataract wound   Anterior Chamber Deep and quiet Deep and quiet   Iris Round and dilated Round and dilated   Lens PC IOL in good position, trace Posterior capsular opacification PC IOL in good position, trace Posterior capsular opacification   Vitreous Vitreous syneresis Vitreous syneresis  Fundus Exam       Right Left   Disc Pink and Sharp, mild PPA, +cupping Pink and Sharp, PPA   C/D Ratio 0.75 0.6   Macula Flat, Good foveal reflex, Drusen, No heme or edema Flat, Good foveal reflex, Drusen, RPE mottling and clumping, No heme or edema   Vessels mild attenuation, mild tortuousity mild attenuation, mild tortuousity   Periphery Attached, no edema Attached, no edema           Refraction     Wearing Rx       Sphere Cylinder Axis Add   Right [place] +1.25 110 +2.50   Left [place] +1.00 097 +2.50         Manifest Refraction       Sphere Cylinder Axis Dist VA   Right       Left -0.25 +1.00 097 NI           IMAGING AND PROCEDURES  Imaging and Procedures for 10/10/2021  OCT, Retina - OU - Both Eyes       Right Eye Quality was good. Central Foveal Thickness: 274. Progression has no prior data. Findings include normal foveal contour, no IRF, no SRF, retinal drusen .   Left Eye Quality was good. Central Foveal Thickness: 275. Progression has no prior data. Findings include normal foveal contour, no IRF, no SRF, retinal drusen (Partial PVD).   Notes *Images captured and stored on drive  Diagnosis / Impression:  Non-exu ARMD OU NFP, no IRF/SRF OU  Clinical management:  See below  Abbreviations: NFP - Normal foveal profile. CME - cystoid macular edema. PED - pigment epithelial detachment. IRF - intraretinal fluid. SRF - subretinal fluid. EZ - ellipsoid zone. ERM - epiretinal membrane. ORA - outer retinal atrophy. ORT - outer retinal tubulation. SRHM - subretinal hyper-reflective material. IRHM - intraretinal hyper-reflective material            ASSESSMENT/PLAN:    ICD-10-CM   1. Intermediate stage nonexudative age-related macular degeneration of both eyes  H35.3132     2. Retinal edema  H35.81 OCT, Retina - OU - Both Eyes    3. Essential hypertension  I10     4. Hypertensive retinopathy of both eyes  H35.033     5. Pseudophakia,  both eyes  Z96.1      1,2. Age related macular degeneration, non-exudative, OU  - intermediate stage  - no exudative disease on exam or OCT  - The incidence, anatomy, and pathology of dry AMD, risk of progression, and the AREDS and AREDS 2 studies including smoking risks discussed with patient.  - Recommend amsler grid monitoring  - f/u 3-4 months, DFE, OCT  3,4. Hypertensive retinopathy OU - discussed importance of tight BP control - monitor  5. Pseudophakia OU  - s/p CE/IOL OU (Dr. Zetta Bills)  - IOLs in good position, doing well  - monitor  Ophthalmic Meds Ordered this visit:  No orders of the defined types were placed in this encounter.    Return for f/u 3-4 months, non-exu ARMD OU, DFE, OCT.  There are no Patient Instructions on file for this visit.   Explained the diagnoses, plan, and follow up with the patient and they expressed understanding.  Patient expressed understanding of the importance of proper follow up care.   This document serves as a record of services personally performed by Karie Chimera, MD, PhD. It was created on their behalf by Herby Abraham, COA, an ophthalmic technician. The creation of  this record is the provider's dictation and/or activities during the visit.    Electronically signed by: Herby Abraham, COA @TODAY @ 5:14 PM  This document serves as a record of services personally performed by , MD, PhD. It was created on their behalf by Karie Chimera. Glee Arvin, OA an ophthalmic technician. The creation of this record is the provider's dictation and/or activities during the visit.    Electronically signed by: Manson Passey. Glee Arvin, Manson Passey 10.17.2022 5:14 PM  10.19.2022, M.D., Ph.D. Diseases & Surgery of the Retina and Vitreous [facility]  I have reviewed the above documentation for accuracy and completeness, and I agree with the above. [facility], M.D., Ph.D. 10/10/21 5:14 PM  Abbreviations: M myopia  (nearsighted); A astigmatism; H hyperopia (farsighted); P presbyopia; Mrx spectacle prescription;  CTL contact lenses; OD right eye; OS left eye; OU both eyes  XT exotropia; ET esotropia; PEK punctate epithelial keratitis; PEE punctate epithelial erosions; DES dry eye syndrome; MGD meibomian gland dysfunction; ATs artificial tears; PFAT's preservative free artificial tears; NSC nuclear sclerotic cataract; PSC posterior subcapsular cataract; ERM epi-retinal membrane; PVD posterior vitreous detachment; RD retinal detachment; DM diabetes mellitus; DR diabetic retinopathy; NPDR non-proliferative diabetic retinopathy; PDR proliferative diabetic retinopathy; CSME clinically significant macular edema; DME diabetic macular edema; dbh dot blot hemorrhages; CWS cotton wool spot; POAG primary open angle glaucoma; C/D cup-to-disc ratio; HVF humphrey visual field; GVF goldmann visual field; OCT optical coherence tomography; IOP intraocular pressure; BRVO Branch retinal vein occlusion; CRVO central retinal vein occlusion; CRAO central retinal artery occlusion; BRAO branch retinal artery occlusion; RT retinal tear; SB scleral buckle; PPV pars plana vitrectomy; VH Vitreous hemorrhage; PRP panretinal laser photocoagulation; IVK intravitreal kenalog; VMT vitreomacular traction; MH Macular hole;  NVD neovascularization of the disc; NVE neovascularization elsewhere; AREDS age related eye disease study; ARMD age related macular degeneration; POAG primary open angle glaucoma; EBMD epithelial/anterior basement membrane dystrophy; ACIOL anterior chamber intraocular lens; IOL intraocular lens; PCIOL posterior chamber intraocular lens; Phaco/IOL phacoemulsification with intraocular lens placement; PRK photorefractive keratectomy; LASIK laser assisted in situ keratomileusis; HTN hypertension; DM diabetes mellitus; COPD chronic obstructive pulmonary disease

## 2021-10-10 ENCOUNTER — Ambulatory Visit (INDEPENDENT_AMBULATORY_CARE_PROVIDER_SITE_OTHER): Payer: Medicare Other | Admitting: Ophthalmology

## 2021-10-10 ENCOUNTER — Other Ambulatory Visit: Payer: Self-pay

## 2021-10-10 ENCOUNTER — Encounter (INDEPENDENT_AMBULATORY_CARE_PROVIDER_SITE_OTHER): Payer: Self-pay | Admitting: Ophthalmology

## 2021-10-10 DIAGNOSIS — H35033 Hypertensive retinopathy, bilateral: Secondary | ICD-10-CM | POA: Diagnosis not present

## 2021-10-10 DIAGNOSIS — H353132 Nonexudative age-related macular degeneration, bilateral, intermediate dry stage: Secondary | ICD-10-CM

## 2021-10-10 DIAGNOSIS — I10 Essential (primary) hypertension: Secondary | ICD-10-CM | POA: Diagnosis not present

## 2021-10-10 DIAGNOSIS — H3581 Retinal edema: Secondary | ICD-10-CM

## 2021-10-10 DIAGNOSIS — Z961 Presence of intraocular lens: Secondary | ICD-10-CM | POA: Diagnosis not present

## 2022-01-23 NOTE — Progress Notes (Signed)
[facility] Clinic Note  01/24/2022     CHIEF COMPLAINT Patient presents for Retina Follow Up    HISTORY OF PRESENT ILLNESS: Robin Robles is a 66 y.o. female who presents to the clinic today for:   HPI     Retina Follow Up   Patient presents with  Dry AMD.  In both eyes.  This started 3 months ago.  I, the attending physician,  performed the HPI with the patient and updated documentation appropriately.        Comments   Patient here for 3 months retina follow up for non exu ARMD OU. Patient states vision doing ok. Has trouble telling the difference between gold and silver. No eye pain. Eye feels like full of glass temporally OD>OS. They itch.      Last edited by Bernarda Caffey, MD on 01/27/2022  4:02 PM.    Pt states vision is stable, she is taking Preservision, pt takes medication for HTN and fibromyalgia   Referring physician: Warden Fillers, MD [address] [place],  [place] [address]  HISTORICAL INFORMATION:   Selected notes from the MEDICAL RECORD NUMBER Referred by Dr. Midge Aver for eval of ARMD OU   CURRENT MEDICATIONS: No current outpatient medications on file. (Ophthalmic Drugs)   No current facility-administered medications for this visit. (Ophthalmic Drugs)   Current Outpatient Medications (Other)  Medication Sig   buPROPion (WELLBUTRIN XL) 150 MG 24 hr tablet Take by mouth.   dicyclomine (BENTYL) 20 MG tablet Take 1 tablet (20 mg total) by mouth 2 (two) times daily.   EPINEPHrine 0.3 mg/0.3 mL IJ SOAJ injection Inject into the muscle.   omeprazole (PRILOSEC) 20 MG capsule Take 20 mg by mouth daily. As needed   pregabalin (LYRICA) 50 MG capsule Take 100 mg by mouth at bedtime.    propranolol (INDERAL) 40 MG tablet Take 40 mg by mouth every evening.   sertraline (ZOLOFT) 100 MG tablet Take 200 mg by mouth every morning.    topiramate (TOPAMAX) 25 MG tablet Take 50 mg by mouth at bedtime.    diclofenac (VOLTAREN) 75  MG EC tablet Take 75 mg by mouth 2 (two) times daily.   famotidine (PEPCID) 20 MG tablet TAKE 1 TABLET BY MOUTH AT BEDTIME (Patient not taking: Reported on 10/23/2018)   meloxicam (MOBIC) 15 MG tablet Take 1 tablet (15 mg total) by mouth daily.   pantoprazole (PROTONIX) 40 MG tablet TAKE 1 TABLET BY MOUTH DAILY TAKE 30 TO 60 MIN BEFORE FIRST MEAL OF DAY (Patient not taking: Reported on 10/23/2018)   traMADol (ULTRAM) 50 MG tablet 1-2 every 4 hours as needed for cough or pain (Patient not taking: Reported on 10/23/2018)   No current facility-administered medications for this visit. (Other)   REVIEW OF SYSTEMS: ROS   Positive for: Neurological, Musculoskeletal, Eyes Negative for: Constitutional, Gastrointestinal, Skin, Genitourinary, HENT, Endocrine, Cardiovascular, Respiratory, Psychiatric, Allergic/Imm, Heme/Lymph Last edited by Theodore Demark, COA on 01/24/2022  1:55 PM.     ALLERGIES Allergies  Allergen Reactions   Bee Venom Anaphylaxis    Throat swelling  Throat swelling    Codeine Other (See Comments)    hallucinations Other reaction(s): Delusions (intolerance), Hallucinations   Oxycodone-Acetaminophen Anxiety    Other reaction(s): Other, Other (See Comments) hyper hyper    PAST MEDICAL HISTORY Past Medical History:  Diagnosis Date   Fibromyalgia    Hypertension    Migraines    Past Surgical History:  Procedure Laterality Date   ABDOMINAL HYSTERECTOMY     CHOLECYSTECTOMY     THYROID SURGERY     FAMILY HISTORY Family History  Problem Relation Age of Onset   Heart failure Mother    Heart failure Father    SOCIAL HISTORY Social History   Tobacco Use   Smoking status: Never   Smokeless tobacco: Never  Vaping Use   Vaping Use: Never used  Substance Use Topics   Alcohol use: No   Drug use: No       OPHTHALMIC EXAM: Base Eye Exam     Visual Acuity (Snellen - Linear)       Right Left   Dist cc 20/20 20/20 -1    Correction: Glasses          Tonometry (Tonopen, 1:52 PM)       Right Left   Pressure 11 12         Pupils       Dark Light Shape React APD   Right 4 3 Round Brisk None   Left 4 3 Round Brisk None         Visual Fields (Counting fingers)       Left Right    Full Full         Extraocular Movement       Right Left    Full, Ortho Full, Ortho         Neuro/Psych     Oriented x3: Yes   Mood/Affect: Normal         Dilation     Both eyes: 1.0% Mydriacyl, 2.5% Phenylephrine @ 1:52 PM           Slit Lamp and Fundus Exam     External Exam       Right Left   External Normal Normal         Slit Lamp Exam       Right Left   Lids/Lashes Dermatochalasis - upper lid, mild MGD Dermatochalasis - upper lid, mild MGD   Conjunctiva/Sclera White and quiet White and quiet   Cornea mild arcus, well healed cataract wound mild arcus, well healed cataract wound   Anterior Chamber Deep and quiet Deep and quiet   Iris Round and dilated Round and dilated   Lens PC IOL in good position, trace Posterior capsular opacification PC IOL in good position, trace Posterior capsular opacification   Anterior Vitreous Vitreous syneresis Vitreous syneresis         Fundus Exam       Right Left   Disc Pink and Sharp, mild PPA, +cupping Pink and Sharp, PPA   C/D Ratio 0.75 0.6   Macula Flat, Good foveal reflex, Drusen, No heme or edema Flat, Good foveal reflex, Drusen, RPE mottling and clumping, No heme or edema   Vessels mild attenuation, mild tortuousity mild attenuation, mild tortuousity   Periphery Attached, focal peripheral drusen at 0300, no heme Attached, focal dot heme at 0530           Refraction     Wearing Rx       Sphere Cylinder Axis Add   Right [place] +1.25 110 +2.50   Left [place] +1.00 097 +2.50           IMAGING AND PROCEDURES  Imaging and Procedures for 01/24/2022  OCT, Retina - OU - Both Eyes       Right Eye Quality was good. Central Foveal Thickness: 267. Progression  has been stable. Findings  include normal foveal contour, no IRF, no SRF, retinal drusen .   Left Eye Quality was good. Central Foveal Thickness: 272. Progression has been stable. Findings include normal foveal contour, no IRF, no SRF, retinal drusen (Partial PVD).   Notes *Images captured and stored on drive  Diagnosis / Impression:  Non-exu ARMD OU NFP, no IRF/SRF OU  Clinical management:  See below  Abbreviations: NFP - Normal foveal profile. CME - cystoid macular edema. PED - pigment epithelial detachment. IRF - intraretinal fluid. SRF - subretinal fluid. EZ - ellipsoid zone. ERM - epiretinal membrane. ORA - outer retinal atrophy. ORT - outer retinal tubulation. SRHM - subretinal hyper-reflective material. IRHM - intraretinal hyper-reflective material             ASSESSMENT/PLAN:    ICD-10-CM   1. Intermediate stage nonexudative age-related macular degeneration of both eyes  H35.3132 OCT, Retina - OU - Both Eyes    2. Essential hypertension  I10     3. Hypertensive retinopathy of both eyes  H35.033     4. Pseudophakia, both eyes  Z96.1       1. Age related macular degeneration, non-exudative, OU  - intermediate stage -- stable  - no exudative disease on exam or OCT  - The incidence, anatomy, and pathology of dry AMD, risk of progression, and the AREDS and AREDS 2 studies including smoking risks discussed with patient.  - Recommend amsler grid monitoring  - f/u 6 months, DFE, OCT  2,3. Hypertensive retinopathy OU - discussed importance of tight BP control - monitor  4. Pseudophakia OU  - s/p CE/IOL OU (Dr. Shirleen Schirmer)  - IOLs in good position, doing well  - monitor  Ophthalmic Meds Ordered this visit:  No orders of the defined types were placed in this encounter.     Return in about 6 months (around 07/24/2022) for f/u non-exu ARMD OU, DFE, OCT.  There are no Patient Instructions on file for this visit.   Explained the diagnoses, plan, and follow up with  the patient and they expressed understanding.  Patient expressed understanding of the importance of proper follow up care.   This document serves as a record of services personally performed by Gardiner Sleeper, MD, PhD. It was created on their behalf by Estill Bakes, COT an ophthalmic technician. The creation of this record is the provider's dictation and/or activities during the visit.    Electronically signed by: Estill Bakes, COT 1.30.23 @ 5:13 PM   This document serves as a record of services personally performed by Gardiner Sleeper, MD, PhD. It was created on their behalf by San Jetty. Owens Shark, OA an ophthalmic technician. The creation of this record is the provider's dictation and/or activities during the visit.    Electronically signed by: San Jetty. Owens Shark, [place] 01.31.2023 5:13 PM   Gardiner Sleeper, M.D., Ph.D. Diseases & Surgery of the Retina and Vitreous [facility]  I have reviewed the above documentation for accuracy and completeness, and I agree with the above. Gardiner Sleeper, M.D., Ph.D. 01/27/22 5:13 PM   Abbreviations: M myopia (nearsighted); A astigmatism; H hyperopia (farsighted); P presbyopia; Mrx spectacle prescription;  CTL contact lenses; OD right eye; OS left eye; OU both eyes  XT exotropia; ET esotropia; PEK punctate epithelial keratitis; PEE punctate epithelial erosions; DES dry eye syndrome; MGD meibomian gland dysfunction; ATs artificial tears; PFAT's preservative free artificial tears; [place] nuclear sclerotic cataract; PSC posterior subcapsular cataract; ERM epi-retinal membrane; PVD  posterior vitreous detachment; RD retinal detachment; DM diabetes mellitus; DR diabetic retinopathy; NPDR non-proliferative diabetic retinopathy; PDR proliferative diabetic retinopathy; CSME clinically significant macular edema; DME diabetic macular edema; dbh dot blot hemorrhages; CWS cotton wool spot; POAG primary open angle glaucoma; C/D cup-to-disc ratio; HVF humphrey  visual field; GVF goldmann visual field; OCT optical coherence tomography; IOP intraocular pressure; BRVO Branch retinal vein occlusion; CRVO central retinal vein occlusion; CRAO central retinal artery occlusion; BRAO branch retinal artery occlusion; RT retinal tear; SB scleral buckle; PPV pars plana vitrectomy; VH Vitreous hemorrhage; PRP panretinal laser photocoagulation; IVK intravitreal kenalog; VMT vitreomacular traction; MH Macular hole;  NVD neovascularization of the disc; NVE neovascularization elsewhere; AREDS age related eye disease study; ARMD age related macular degeneration; POAG primary open angle glaucoma; EBMD epithelial/anterior basement membrane dystrophy; ACIOL anterior chamber intraocular lens; IOL intraocular lens; PCIOL posterior chamber intraocular lens; Phaco/IOL phacoemulsification with intraocular lens placement; [place] photorefractive keratectomy; LASIK laser assisted in situ keratomileusis; HTN hypertension; DM diabetes mellitus; COPD chronic obstructive pulmonary disease

## 2022-01-24 ENCOUNTER — Encounter (INDEPENDENT_AMBULATORY_CARE_PROVIDER_SITE_OTHER): Payer: Self-pay | Admitting: Ophthalmology

## 2022-01-24 ENCOUNTER — Other Ambulatory Visit: Payer: Self-pay

## 2022-01-24 ENCOUNTER — Ambulatory Visit (INDEPENDENT_AMBULATORY_CARE_PROVIDER_SITE_OTHER): Payer: Medicare Other | Admitting: Ophthalmology

## 2022-01-24 DIAGNOSIS — H35033 Hypertensive retinopathy, bilateral: Secondary | ICD-10-CM | POA: Diagnosis not present

## 2022-01-24 DIAGNOSIS — H353132 Nonexudative age-related macular degeneration, bilateral, intermediate dry stage: Secondary | ICD-10-CM

## 2022-01-24 DIAGNOSIS — Z961 Presence of intraocular lens: Secondary | ICD-10-CM

## 2022-01-24 DIAGNOSIS — I10 Essential (primary) hypertension: Secondary | ICD-10-CM

## 2022-01-27 ENCOUNTER — Encounter (INDEPENDENT_AMBULATORY_CARE_PROVIDER_SITE_OTHER): Payer: Self-pay | Admitting: Ophthalmology

## 2022-03-21 ENCOUNTER — Emergency Department (HOSPITAL_BASED_OUTPATIENT_CLINIC_OR_DEPARTMENT_OTHER)
Admission: EM | Admit: 2022-03-21 | Discharge: 2022-03-21 | Disposition: A | Discharge status: Home / Self Care | Payer: Medicare Other | Attending: Emergency Medicine | Admitting: Emergency Medicine

## 2022-03-21 ENCOUNTER — Encounter (HOSPITAL_BASED_OUTPATIENT_CLINIC_OR_DEPARTMENT_OTHER): Payer: Self-pay | Admitting: Emergency Medicine

## 2022-03-21 ENCOUNTER — Other Ambulatory Visit: Payer: Self-pay

## 2022-03-21 DIAGNOSIS — T465X5A Adverse effect of other antihypertensive drugs, initial encounter: Secondary | ICD-10-CM | POA: Diagnosis not present

## 2022-03-21 DIAGNOSIS — T887XXA Unspecified adverse effect of drug or medicament, initial encounter: Secondary | ICD-10-CM | POA: Diagnosis not present

## 2022-03-21 DIAGNOSIS — I959 Hypotension, unspecified: Secondary | ICD-10-CM | POA: Insufficient documentation

## 2022-03-21 DIAGNOSIS — R42 Dizziness and giddiness: Secondary | ICD-10-CM | POA: Diagnosis not present

## 2022-03-21 LAB — BASIC METABOLIC PANEL
Anion gap: 8 (ref 5–15)
BUN: 27 mg/dL — ABNORMAL HIGH (ref 8–23)
CO2: 26 mmol/L (ref 22–32)
Calcium: 9.5 mg/dL (ref 8.9–10.3)
Chloride: 102 mmol/L (ref 98–111)
Creatinine, Ser: 0.82 mg/dL (ref 0.44–1.00)
GFR, Estimated: >60 mL/min (ref >60)
Glucose, Bld: 116 mg/dL — ABNORMAL HIGH (ref 70–99)
Potassium: 4.4 mmol/L (ref 3.5–5.1)
Sodium: 136 mmol/L (ref 135–145)

## 2022-03-21 MED ORDER — LACTATED RINGERS IV SOLN: INTRAVENOUS | Status: DC

## 2022-03-21 MED ORDER — LACTATED RINGERS IV BOLUS
1000.0000 mL | Freq: Once | INTRAVENOUS | Status: AC
  Administered 2022-03-21: 1000 mL via INTRAVENOUS

## 2022-03-21 NOTE — Discharge Instructions (Signed)
Drink plenty of fluids and follow-up with your doctor as needed. °

## 2022-03-21 NOTE — ED Provider Notes (Signed)
?[facility] EM[facility] DEPT ?Provider Note ? ? ?CSN: [id number] ?Arrival date & time: 03/21/22  1113 ? ?  ? ?History ? ?Chief Complaint  ?Patient presents with  ? Hypotension  ? ? ?Robin Robles is a 66 y.o. female. ? ?[name] presents with dizziness lightheaded along with low blood pressure.  Patient started new medications recently for back discomfort.  Denies any recent illnesses such as fever or chills.  No recent vomiting or diarrhea.  States that she took her blood pressure at home and it was in the 60s and 70s.  Called her doctor and told to come here.  She denies any syncope or near syncope. ? ? ?  ? ?Home Medications ?Prior to Admission medications   ?Medication Sig Start Date End Date Taking? Authorizing Provider  ?buPROPion (WELLBUTRIN XL) 150 MG 24 hr tablet Take by mouth. 05/07/20   [provider]  ?diclofenac (VOLTAREN) 75 MG EC tablet Take 75 mg by mouth 2 (two) times daily.    [provider]  ?dicyclomine (BENTYL) 20 MG tablet Take 1 tablet (20 mg total) by mouth 2 (two) times daily. 10/23/18   Mortis, Jerrel Ivory I, PA-C  ?EPINEPHrine 0.3 mg/0.3 mL IJ SOAJ injection Inject into the muscle. 06/06/17   [provider]  ?famotidine (PEPCID) 20 MG tablet TAKE 1 TABLET BY MOUTH AT BEDTIME ?Patient not taking: Reported on 10/23/2018 10/30/17   Nyoka Cowden, MD  ?meloxicam (MOBIC) 15 MG tablet Take 1 tablet (15 mg total) by mouth daily. 05/10/20   Wallis Bamberg, PA-C  ?omeprazole (PRILOSEC) 20 MG capsule Take 20 mg by mouth daily. As needed    [provider]  ?pantoprazole (PROTONIX) 40 MG tablet TAKE 1 TABLET BY MOUTH DAILY TAKE 30 TO 60 MIN BEFORE FIRST MEAL OF DAY ?Patient not taking: Reported on 10/23/2018 10/30/17   Nyoka Cowden, MD  ?pregabalin (LYRICA) 50 MG capsule Take 100 mg by mouth at bedtime.     [provider]  ?propranolol (INDERAL) 40 MG tablet Take 40 mg by mouth every evening.    [provider]   ?sertraline (ZOLOFT) 100 MG tablet Take 200 mg by mouth every morning.     [provider]  ?topiramate (TOPAMAX) 25 MG tablet Take 50 mg by mouth at bedtime.     [provider]  ?traMADol (ULTRAM) 50 MG tablet 1-2 every 4 hours as needed for cough or pain ?Patient not taking: Reported on 10/23/2018 07/31/17   Nyoka Cowden, MD  ?   ? ?Allergies    ?Bee venom, Codeine, and Oxycodone-acetaminophen   ? ?Review of Systems   ?Review of Systems  ?All other systems reviewed and are negative. ? ?Physical Exam ?Updated Vital Signs ?BP 90/65 (BP Location: Right Arm)   Pulse 61   Temp 97.6 ?F (36.4 ?C)   Resp 18   Ht 1.575 m (5\' 2" )   Wt 76.7 kg   SpO2 95%   BMI 30.93 kg/m?  ?Physical Exam ?Vitals and nursing note reviewed.  ?Constitutional:   ?   General: She is not in acute distress. ?   Appearance: Normal appearance. She is well-developed. She is not toxic-appearing.  ?HENT:  ?   Head: Normocephalic and atraumatic.  ?Eyes:  ?   General: Lids are normal.  ?   Conjunctiva/sclera: Conjunctivae normal.  ?   Pupils: Pupils are equal, round, and reactive to light.  ?Neck:  ?   Thyroid: No thyroid mass.  ?  Trachea: No tracheal deviation.  ?Cardiovascular:  ?   Rate and Rhythm: Normal rate and regular rhythm.  ?   Heart sounds: Normal heart sounds. No murmur heard. ?  No gallop.  ?Pulmonary:  ?   Effort: Pulmonary effort is normal. No respiratory distress.  ?   Breath sounds: Normal breath sounds. No stridor. No decreased breath sounds, wheezing, rhonchi or rales.  ?Abdominal:  ?   General: There is no distension.  ?   Palpations: Abdomen is soft.  ?   Tenderness: There is no abdominal tenderness. There is no rebound.  ?Musculoskeletal:     ?   General: No tenderness. Normal range of motion.  ?   Cervical back: Normal range of motion and neck supple.  ?Skin: ?   General: Skin is warm and dry.  ?   Findings: No abrasion or rash.  ?Neurological:  ?   Mental Status: She is alert and oriented to person,  place, and time. Mental status is at baseline.  ?   GCS: GCS eye subscore is 4. GCS verbal subscore is 5. GCS motor subscore is 6.  ?   Cranial Nerves: No cranial nerve deficit.  ?   Sensory: No sensory deficit.  ?   Motor: Motor function is intact.  ?Psychiatric:     ?   Attention and Perception: Attention normal.     ?   Speech: Speech normal.     ?   Behavior: Behavior normal.  ? ? ?ED Results / Procedures / Treatments   ?Labs ?(all labs ordered are listed, but only abnormal results are displayed) ?Labs Reviewed - No data to display ? ?EKG ?EKG Interpretation ? ?Date/Time:  Tuesday March 21 2022 11:30:07 EDT ?Ventricular Rate:  58 ?PR Interval:  146 ?QRS Duration: 86 ?QT Interval:  402 ?QTC Calculation: 395 ?R Axis:   81 ?Text Interpretation: Sinus rhythm Atrial premature complex Borderline right axis deviation Confirmed by Lorre Nick ([id number]) on 03/21/2022 11:33:06 AM ? ?Radiology ?No results found. ? ?Procedures ?Procedures  ? ? ?Medications Ordered in ED ?Medications  ?lactated ringers bolus 1,000 mL (has no administration in time range)  ?lactated ringers infusion (has no administration in time range)  ? ? ?ED Course/ Medical Decision Making/ A&P ?  ?                        ?Medical Decision Making ?Amount and/or Complexity of Data Reviewed ?Labs: ordered. ? ?Risk ?Prescription drug management. ? ? ?Patient's blood pressure has been stable here.  She was given 1 L of lactated Ringer's and feels better.  Electrolytes are reassuring.  Suspect medication effect as cause of her hypertension.  Will discharge home ? ? ? ? ? ? ? ?Final Clinical Impression(s) / ED Diagnoses ?Final diagnoses:  ?None  ? ? ?Rx / DC Orders ?ED Discharge Orders   ? ? None  ? ?  ? ? ?  ?Lorre Nick, MD ?03/21/22 1308 ? ?

## 2022-03-21 NOTE — ED Triage Notes (Signed)
Pt via pov from home with hypotension after taking medications this morning for back pain, including propanolol and gabapentin. She reports that her bp at home was 68/56 then 80/57. She called her pcp and was told to come to ed. Current bp 90/65. Pt is sleepy during triage. Pt oriented x 4, pale in appearance.  ?

## 2022-04-19 NOTE — Progress Notes (Shared)
?[facility] - Clinic Note ? ?04/20/2022 ? ?  ? ?CHIEF COMPLAINT ?Patient presents for No chief complaint on file. ? ? ? ?HISTORY OF PRESENT ILLNESS: ?Robin Robles is a 66 y.o. female who presents to the clinic today for:  ? ? ?Pt states vision is stable, she is taking Preservision, pt takes medication for HTN and fibromyalgia ? ? ?Referring physician: ?Eartha Inch, MD ?[address] ?[place],  [place] [address] ? ?HISTORICAL INFORMATION:  ? ?Selected notes from the MEDICAL RECORD NUMBER ?Referred by Dr. Marchelle Gearing for eval of ARMD OU  ? ?CURRENT MEDICATIONS: ?No current outpatient medications on file. (Ophthalmic Drugs)  ? ?No current facility-administered medications for this visit. (Ophthalmic Drugs)  ? ?Current Outpatient Medications (Other)  ?Medication Sig  ? buPROPion (WELLBUTRIN XL) 150 MG 24 hr tablet Take by mouth.  ? diclofenac (VOLTAREN) 75 MG EC tablet Take 75 mg by mouth 2 (two) times daily.  ? dicyclomine (BENTYL) 20 MG tablet Take 1 tablet (20 mg total) by mouth 2 (two) times daily.  ? EPINEPHrine 0.3 mg/0.3 mL IJ SOAJ injection Inject into the muscle.  ? famotidine (PEPCID) 20 MG tablet TAKE 1 TABLET BY MOUTH AT BEDTIME (Patient not taking: Reported on 10/23/2018)  ? meloxicam (MOBIC) 15 MG tablet Take 1 tablet (15 mg total) by mouth daily.  ? omeprazole (PRILOSEC) 20 MG capsule Take 20 mg by mouth daily. As needed  ? pantoprazole (PROTONIX) 40 MG tablet TAKE 1 TABLET BY MOUTH DAILY TAKE 30 TO 60 MIN BEFORE FIRST MEAL OF DAY (Patient not taking: Reported on 10/23/2018)  ? pregabalin (LYRICA) 50 MG capsule Take 100 mg by mouth at bedtime.   ? propranolol (INDERAL) 40 MG tablet Take 40 mg by mouth every evening.  ? sertraline (ZOLOFT) 100 MG tablet Take 200 mg by mouth every morning.   ? topiramate (TOPAMAX) 25 MG tablet Take 50 mg by mouth at bedtime.   ? traMADol (ULTRAM) 50 MG tablet 1-2 every 4 hours as needed for cough or pain (Patient not taking: Reported on  10/23/2018)  ? ?No current facility-administered medications for this visit. (Other)  ? ?REVIEW OF SYSTEMS: ? ? ?ALLERGIES ?Allergies  ?Allergen Reactions  ? Bee Venom Anaphylaxis  ?  Throat swelling  ?Throat swelling   ? Codeine Other (See Comments)  ?  hallucinations ?Other reaction(s): Delusions (intolerance), Hallucinations  ? Oxycodone-Acetaminophen Anxiety  ?  Other reaction(s): Other, Other (See Comments) ?hyper ?hyper ?  ? ?PAST MEDICAL HISTORY ?Past Medical History:  ?Diagnosis Date  ? Fibromyalgia   ? Hypertension   ? Migraines   ? ?Past Surgical History:  ?Procedure Laterality Date  ? ABDOMINAL HYSTERECTOMY    ? CHOLECYSTECTOMY    ? THYROID SURGERY    ? ?FAMILY HISTORY ?Family History  ?Problem Relation Age of Onset  ? Heart failure Mother   ? Heart failure Father   ? ?SOCIAL HISTORY ?Social History  ? ?Tobacco Use  ? Smoking status: Never  ? Smokeless tobacco: Never  ?Vaping Use  ? Vaping Use: Never used  ?Substance Use Topics  ? Alcohol use: No  ? Drug use: No  ?  ? ?  ?OPHTHALMIC EXAM: ?Not recorded ?  ? ?IMAGING AND PROCEDURES  ?Imaging and Procedures for 04/20/2022 ? ? ? ?  ?  ? ?  ?ASSESSMENT/PLAN: ? ?  ICD-10-CM   ?1. Intermediate stage nonexudative age-related macular degeneration of both eyes  H35.3132   ?  ?2.  Essential hypertension  I10   ?  ?3. Hypertensive retinopathy of both eyes  H35.033   ?  ?4. Pseudophakia, both eyes  Z96.1   ?  ? ? ?1. Age related macular degeneration, non-exudative, OU ? - intermediate stage -- stable ? - no exudative disease on exam or OCT ? - The incidence, anatomy, and pathology of dry AMD, risk of progression, and the AREDS and AREDS 2 studies including smoking risks discussed with patient. ? - Recommend amsler grid monitoring ? - f/u 6 months, DFE, OCT ? ?2,3. Hypertensive retinopathy OU ?- discussed importance of tight BP control ?- monitor ? ?4. Pseudophakia OU ? - s/p CE/IOL OU (Dr. Zetta Bills) ? - IOLs in good position, doing well ? - monitor ? ?Ophthalmic Meds  Ordered this visit:  ?No orders of the defined types were placed in this encounter. ? ?  ? ?No follow-ups on file. ? ?There are no Patient Instructions on file for this visit. ? ? ?Explained the diagnoses, plan, and follow up with the patient and they expressed understanding.  Patient expressed understanding of the importance of proper follow up care.  ? ?This document serves as a record of services personally performed by Karie Chimera, MD, PhD. It was created on their behalf by Glee Arvin. Manson Passey, OA an ophthalmic technician. The creation of this record is the provider's dictation and/or activities during the visit.   ? ?Electronically signed by: Glee Arvin. Manson Passey, [place] 04.26.2023 3:18 PM ? ? ? ?Karie Chimera, M.D., Ph.D. ?Diseases & Surgery of the Retina and Vitreous ?[facility] ? ? ? ? ?Abbreviations: ?M myopia (nearsighted); A astigmatism; H hyperopia (farsighted); P presbyopia; Mrx spectacle prescription;  CTL contact lenses; OD right eye; OS left eye; OU both eyes  XT exotropia; ET esotropia; PEK punctate epithelial keratitis; PEE punctate epithelial erosions; DES dry eye syndrome; MGD meibomian gland dysfunction; ATs artificial tears; PFAT's preservative free artificial tears; NSC nuclear sclerotic cataract; PSC posterior subcapsular cataract; ERM epi-retinal membrane; PVD posterior vitreous detachment; RD retinal detachment; DM diabetes mellitus; DR diabetic retinopathy; NPDR non-proliferative diabetic retinopathy; PDR proliferative diabetic retinopathy; CSME clinically significant macular edema; DME diabetic macular edema; dbh dot blot hemorrhages; CWS cotton wool spot; POAG primary open angle glaucoma; C/D cup-to-disc ratio; HVF humphrey visual field; GVF goldmann visual field; OCT optical coherence tomography; IOP intraocular pressure; BRVO Branch retinal vein occlusion; CRVO central retinal vein occlusion; CRAO central retinal artery occlusion; BRAO branch retinal artery occlusion;  RT retinal tear; SB scleral buckle; PPV pars plana vitrectomy; VH Vitreous hemorrhage; PRP panretinal laser photocoagulation; IVK intravitreal kenalog; VMT vitreomacular traction; MH Macular hole;  NVD neovascularization of the disc; NVE neovascularization elsewhere; AREDS age related eye disease study; ARMD age related macular degeneration; POAG primary open angle glaucoma; EBMD epithelial/anterior basement membrane dystrophy; ACIOL anterior chamber intraocular lens; IOL intraocular lens; PCIOL posterior chamber intraocular lens; Phaco/IOL phacoemulsification with intraocular lens placement; PRK photorefractive keratectomy; LASIK laser assisted in situ keratomileusis; HTN hypertension; DM diabetes mellitus; COPD chronic obstructive pulmonary disease ? ?

## 2022-04-20 ENCOUNTER — Encounter (INDEPENDENT_AMBULATORY_CARE_PROVIDER_SITE_OTHER): Payer: Medicare Other | Admitting: Ophthalmology

## 2022-04-20 DIAGNOSIS — I10 Essential (primary) hypertension: Secondary | ICD-10-CM

## 2022-04-20 DIAGNOSIS — H35033 Hypertensive retinopathy, bilateral: Secondary | ICD-10-CM

## 2022-04-20 DIAGNOSIS — H353132 Nonexudative age-related macular degeneration, bilateral, intermediate dry stage: Secondary | ICD-10-CM

## 2022-04-20 DIAGNOSIS — Z961 Presence of intraocular lens: Secondary | ICD-10-CM

## 2022-04-21 ENCOUNTER — Ambulatory Visit (INDEPENDENT_AMBULATORY_CARE_PROVIDER_SITE_OTHER): Payer: Medicare Other | Admitting: Ophthalmology

## 2022-04-21 ENCOUNTER — Encounter (INDEPENDENT_AMBULATORY_CARE_PROVIDER_SITE_OTHER): Payer: Self-pay | Admitting: Ophthalmology

## 2022-04-21 DIAGNOSIS — H35033 Hypertensive retinopathy, bilateral: Secondary | ICD-10-CM | POA: Diagnosis not present

## 2022-04-21 DIAGNOSIS — I10 Essential (primary) hypertension: Secondary | ICD-10-CM

## 2022-04-21 DIAGNOSIS — H353132 Nonexudative age-related macular degeneration, bilateral, intermediate dry stage: Secondary | ICD-10-CM | POA: Diagnosis not present

## 2022-04-21 DIAGNOSIS — G43109 Migraine with aura, not intractable, without status migrainosus: Secondary | ICD-10-CM | POA: Diagnosis not present

## 2022-04-21 DIAGNOSIS — G43101 Migraine with aura, not intractable, with status migrainosus: Secondary | ICD-10-CM

## 2022-04-21 DIAGNOSIS — Z961 Presence of intraocular lens: Secondary | ICD-10-CM

## 2022-04-21 NOTE — Progress Notes (Signed)
?[facility] - Clinic Note ? ?04/21/2022 ? ?  ? ?CHIEF COMPLAINT ?Patient presents for Retina Follow Up ? ? ? ?HISTORY OF PRESENT ILLNESS: ?Robin Robles is a 66 y.o. female who presents to the clinic today for:  ? ?HPI   ? ? Retina Follow Up   ?Patient presents with  Dry AMD.  In both eyes.  This started 2 weeks ago.  I, the attending physician,  performed the HPI with the patient and updated documentation appropriately. ? ?  ?  ? ? Comments   ?Patient here for retina evaluation for visual disturbance OD. Patient states vision is ok. Having migraines for last 2 - 3 months. OD 2 weeks ago started getting different semetrical from what has had before. It is stationary to eye sight. No eye pain. Using Aimovig injector for migraines once a month. ? ?  ?  ?Last edited by Rennis Chris, MD on 04/27/2022  8:00 PM.  ?  ?Pt is here for visual disturbances OD, pt states she has had migraines since she was 10, she has started seeing new objects in her vision during her migraines, she states she normally has auras with her headaches, but they are typically very wavy lines with colors, she states now it is a symmetrical object with black lines coming out of the sides, pt does not see a neurologist, pt states she was on Topamax for her headaches, but she was having trouble speaking and thinking clearly, pt was weaned off Topamax and states that's when her migraines started, pt is on Aimovig injector once a month as well as a muscle relaxer ? ? ?Referring physician: ?Eartha Inch, MD ?[address] ?[place],  [place] [address] ? ?HISTORICAL INFORMATION:  ? ?Selected notes from the MEDICAL RECORD NUMBER ?Referred by Dr. Marchelle Gearing for eval of ARMD OU  ? ?CURRENT MEDICATIONS: ?No current outpatient medications on file. (Ophthalmic Drugs)  ? ?No current facility-administered medications for this visit. (Ophthalmic Drugs)  ? ?Current Outpatient Medications (Other)  ?Medication Sig  ? buPROPion (WELLBUTRIN XL)  150 MG 24 hr tablet Take by mouth.  ? dicyclomine (BENTYL) 20 MG tablet Take 1 tablet (20 mg total) by mouth 2 (two) times daily.  ? EPINEPHrine 0.3 mg/0.3 mL IJ SOAJ injection Inject into the muscle.  ? omeprazole (PRILOSEC) 20 MG capsule Take 20 mg by mouth daily. As needed  ? pregabalin (LYRICA) 50 MG capsule Take 100 mg by mouth at bedtime.   ? propranolol (INDERAL) 40 MG tablet Take 40 mg by mouth every evening.  ? sertraline (ZOLOFT) 100 MG tablet Take 200 mg by mouth every morning.   ? diclofenac (VOLTAREN) 75 MG EC tablet Take 75 mg by mouth 2 (two) times daily.  ? famotidine (PEPCID) 20 MG tablet TAKE 1 TABLET BY MOUTH AT BEDTIME (Patient not taking: Reported on 10/23/2018)  ? meloxicam (MOBIC) 15 MG tablet Take 1 tablet (15 mg total) by mouth daily.  ? pantoprazole (PROTONIX) 40 MG tablet TAKE 1 TABLET BY MOUTH DAILY TAKE 30 TO 60 MIN BEFORE FIRST MEAL OF DAY (Patient not taking: Reported on 10/23/2018)  ? topiramate (TOPAMAX) 25 MG tablet Take 50 mg by mouth at bedtime.   ? traMADol (ULTRAM) 50 MG tablet 1-2 every 4 hours as needed for cough or pain (Patient not taking: Reported on 10/23/2018)  ? ?No current facility-administered medications for this visit. (Other)  ? ?REVIEW OF SYSTEMS: ?ROS   ?Positive for: Neurological, Musculoskeletal,  Eyes ?Negative for: Constitutional, Gastrointestinal, Skin, Genitourinary, HENT, Endocrine, Cardiovascular, Respiratory, Psychiatric, Allergic/Imm, Heme/Lymph ?Last edited by Laddie Aquas, COA on 04/21/2022  8:50 AM.  ?  ? ?ALLERGIES ?Allergies  ?Allergen Reactions  ? Bee Venom Anaphylaxis  ?  Throat swelling  ?Throat swelling   ? Codeine Other (See Comments)  ?  hallucinations ?Other reaction(s): Delusions (intolerance), Hallucinations  ? Oxycodone-Acetaminophen Anxiety  ?  Other reaction(s): Other, Other (See Comments) ?hyper ?hyper ?  ? ?PAST MEDICAL HISTORY ?Past Medical History:  ?Diagnosis Date  ? Fibromyalgia   ? Hypertension   ? Migraines   ? ?Past Surgical  History:  ?Procedure Laterality Date  ? ABDOMINAL HYSTERECTOMY    ? CHOLECYSTECTOMY    ? THYROID SURGERY    ? ?FAMILY HISTORY ?Family History  ?Problem Relation Age of Onset  ? Heart failure Mother   ? Heart failure Father   ? ?SOCIAL HISTORY ?Social History  ? ?Tobacco Use  ? Smoking status: Never  ? Smokeless tobacco: Never  ?Vaping Use  ? Vaping Use: Never used  ?Substance Use Topics  ? Alcohol use: No  ? Drug use: No  ?  ? ?  ?OPHTHALMIC EXAM: ?Base Eye Exam   ? ? Visual Acuity (Snellen - Linear)   ? ?   Right Left  ? Dist cc 20/20 -1 20/20 -1  ? ? Correction: Glasses  ? ?  ?  ? ? Tonometry (Tonopen, 8:44 AM)   ? ?   Right Left  ? Pressure 16 13  ? ?  ?  ? ? Pupils   ? ?   Dark Light Shape React APD  ? Right 4 3 Round Brisk None  ? Left 4 3 Round Brisk None  ? ?  ?  ? ? Visual Fields (Counting fingers)   ? ?   Left Right  ?  Full Full  ? ?  ?  ? ? Extraocular Movement   ? ?   Right Left  ?  Full, Ortho Full, Ortho  ? ?  ?  ? ? Neuro/Psych   ? ? Oriented x3: Yes  ? Mood/Affect: Normal  ? ?  ?  ? ? Dilation   ? ? Both eyes: 1.0% Mydriacyl, 2.5% Phenylephrine @ 8:44 AM  ? ?  ?  ? ?  ? ?Slit Lamp and Fundus Exam   ? ? External Exam   ? ?   Right Left  ? External Normal Normal  ? ?  ?  ? ? Slit Lamp Exam   ? ?   Right Left  ? Lids/Lashes Dermatochalasis - upper lid, mild MGD Dermatochalasis - upper lid, mild MGD  ? Conjunctiva/Sclera White and quiet White and quiet  ? Cornea mild arcus, well healed cataract wound mild arcus, well healed cataract wound  ? Anterior Chamber Deep and quiet Deep and quiet  ? Iris Round and dilated Round and dilated  ? Lens PC IOL in good position, 1-2+Posterior capsular opacification PC IOL in good position, trace Posterior capsular opacification  ? Anterior Vitreous Vitreous syneresis Vitreous syneresis  ? ?  ?  ? ? Fundus Exam   ? ?   Right Left  ? Disc mild pallor, sharp rim, mild PPA, +cupping Pink and Sharp, PPA  ? C/D Ratio 0.7 0.6  ? Macula Flat, Good foveal reflex, Drusen, No heme  or edema Flat, Good foveal reflex, Drusen, RPE mottling and clumping, No heme or edema  ? Vessels mild attenuation mild  attenuation, mild tortuousity  ? Periphery Attached, focal peripheral drusen at 0300, no heme Attached, focal dot heme at 0530  ? ?  ?  ? ?  ? ?Refraction   ? ? Wearing Rx   ? ?   Sphere Cylinder Axis Add  ? Right [place] +1.25 110 +2.50  ? Left [place] +1.00 097 +2.50  ? ?  ?  ? ?  ? ?IMAGING AND PROCEDURES  ?Imaging and Procedures for 04/21/2022 ? ?OCT, Retina - OU - Both Eyes   ? ?   ?Right Eye ?Quality was good. Central Foveal Thickness: 275. Progression has been stable. Findings include normal foveal contour, no IRF, no SRF, retinal drusen .  ? ?Left Eye ?Quality was good. Central Foveal Thickness: 285. Progression has been stable. Findings include normal foveal contour, no IRF, no SRF, retinal drusen (Partial PVD).  ? ?Notes ?*Images captured and stored on drive ? ?Diagnosis / Impression:  ?Non-exu ARMD OU ?NFP, no IRF/SRF OU ? ?Clinical management:  ?See below ? ?Abbreviations: NFP - Normal foveal profile. CME - cystoid macular edema. PED - pigment epithelial detachment. IRF - intraretinal fluid. SRF - subretinal fluid. EZ - ellipsoid zone. ERM - epiretinal membrane. ORA - outer retinal atrophy. ORT - outer retinal tubulation. SRHM - subretinal hyper-reflective material. IRHM - intraretinal hyper-reflective material ? ? ?  ? ? ?  ?  ? ?  ?ASSESSMENT/PLAN: ? ?  ICD-10-CM   ?1. Intermediate stage nonexudative age-related macular degeneration of both eyes  H35.3132 OCT, Retina - OU - Both Eyes  ?  ?2. 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  ?3. Essential hypertension  I10   ?  ?4. Hypertensive retinopathy of both eyes  H35.033   ?  ?5. Pseudophakia, both eyes  Z96.1   ?  ? ? ?1. Age related macular degeneration, non-exudative, OU ? - intermediate stage -- stable ? - no exudative disease on exam or OCT ? - The incidence, anatomy, and pathology of dry AMD, risk of progression, and the AREDS and AREDS 2 studies  including smoking risks discussed with patient. ? - Recommend amsler grid monitoring ? - f/u as scheduled, DFE, OCT ? ?2. Ocular migraine v migraine w/ aura ? - pt with known history of migraines w/

## 2022-04-27 ENCOUNTER — Encounter (INDEPENDENT_AMBULATORY_CARE_PROVIDER_SITE_OTHER): Payer: Self-pay | Admitting: Ophthalmology

## 2022-07-11 NOTE — Progress Notes (Signed)
[facility] - Clinic Note  07/25/2022     CHIEF COMPLAINT Patient presents for Retina Follow Up    HISTORY OF PRESENT ILLNESS: Robin Robles is a 66 y.o. female who presents to the clinic today for:   HPI     Retina Follow Up   Patient presents with  Dry AMD.  In both eyes.  This started months ago.  Duration of 3.5 months.  I, the attending physician,  performed the HPI with the patient and updated documentation appropriately.        Comments   Patient feels that the vision has remained the same since her last visit in April.       Last edited by Rennis Chris, MD on 07/25/2022  9:10 PM.    Pt states she has not noticed any change in vision, she is taking AREDS 2 and checking her vision on an amsler grid, pt states she is still having migraines almost every day, but the aura's have gotten better, she had to stop taking Topamax bc it was giving her "alzheimer's like symptoms", she is now taking   Referring physician: Eartha Inch, MD [address] [place],  [place] [address]  HISTORICAL INFORMATION:   Selected notes from the MEDICAL RECORD NUMBER Referred by Dr. Marchelle Gearing for eval of ARMD OU   CURRENT MEDICATIONS: No current outpatient medications on file. (Ophthalmic Drugs)   No current facility-administered medications for this visit. (Ophthalmic Drugs)   Current Outpatient Medications (Other)  Medication Sig   buPROPion (WELLBUTRIN XL) 150 MG 24 hr tablet Take by mouth.   diclofenac (VOLTAREN) 75 MG EC tablet Take 75 mg by mouth 2 (two) times daily.   dicyclomine (BENTYL) 20 MG tablet Take 1 tablet (20 mg total) by mouth 2 (two) times daily.   EPINEPHrine 0.3 mg/0.3 mL IJ SOAJ injection Inject into the muscle.   famotidine (PEPCID) 20 MG tablet TAKE 1 TABLET BY MOUTH AT BEDTIME (Patient not taking: Reported on 10/23/2018)   meloxicam (MOBIC) 15 MG tablet Take 1 tablet (15 mg total) by mouth daily.   omeprazole (PRILOSEC) 20 MG capsule  Take 20 mg by mouth daily. As needed   pantoprazole (PROTONIX) 40 MG tablet TAKE 1 TABLET BY MOUTH DAILY TAKE 30 TO 60 MIN BEFORE FIRST MEAL OF DAY (Patient not taking: Reported on 10/23/2018)   pregabalin (LYRICA) 50 MG capsule Take 100 mg by mouth at bedtime.    propranolol (INDERAL) 40 MG tablet Take 40 mg by mouth every evening.   sertraline (ZOLOFT) 100 MG tablet Take 200 mg by mouth every morning.    topiramate (TOPAMAX) 25 MG tablet Take 50 mg by mouth at bedtime.    traMADol (ULTRAM) 50 MG tablet 1-2 every 4 hours as needed for cough or pain (Patient not taking: Reported on 10/23/2018)   No current facility-administered medications for this visit. (Other)   REVIEW OF SYSTEMS: ROS   Positive for: Neurological, Musculoskeletal, Eyes Negative for: Constitutional, Gastrointestinal, Skin, Genitourinary, HENT, Endocrine, Cardiovascular, Respiratory, Psychiatric, Allergic/Imm, Heme/Lymph Last edited by Julieanne Cotton, COT on 07/25/2022  1:39 PM.      ALLERGIES Allergies  Allergen Reactions   Bee Venom Anaphylaxis    Throat swelling  Throat swelling    Codeine Other (See Comments)    hallucinations Other reaction(s): Delusions (intolerance), Hallucinations   Oxycodone-Acetaminophen Anxiety    Other reaction(s): Other, Other (See Comments) hyper hyper    PAST MEDICAL HISTORY Past Medical  History:  Diagnosis Date   Fibromyalgia    Hypertension    Migraines    Past Surgical History:  Procedure Laterality Date   ABDOMINAL HYSTERECTOMY     CHOLECYSTECTOMY     THYROID SURGERY     FAMILY HISTORY Family History  Problem Relation Age of Onset   Heart failure Mother    Heart failure Father    SOCIAL HISTORY Social History   Tobacco Use   Smoking status: Never   Smokeless tobacco: Never  Vaping Use   Vaping Use: Never used  Substance Use Topics   Alcohol use: No   Drug use: No       OPHTHALMIC EXAM: Base Eye Exam     Visual Acuity (Snellen - Linear)        Right Left   Dist cc 20/20 20/25    Correction: Glasses         Tonometry (Tonopen, 1:43 PM)       Right Left   Pressure 15 14         Pupils       Dark Light Shape React APD   Right 4 3 Round Brisk None   Left 4 3 Round Brisk None         Visual Fields       Left Right    Full Full         Extraocular Movement       Right Left    Full, Ortho Full, Ortho         Neuro/Psych     Oriented x3: Yes   Mood/Affect: Normal         Dilation     Both eyes: 2.5% Phenylephrine, 1.0% Mydriacyl @ 1:41 PM           Slit Lamp and Fundus Exam     External Exam       Right Left   External Normal Normal         Slit Lamp Exam       Right Left   Lids/Lashes Dermatochalasis - upper lid, mild MGD Dermatochalasis - upper lid, mild MGD   Conjunctiva/Sclera White and quiet White and quiet   Cornea mild arcus, well healed cataract wound mild arcus, well healed cataract wound   Anterior Chamber Deep and quiet Deep and quiet   Iris Round and dilated Round and dilated   Lens PC IOL in good position, 1-2+Posterior capsular opacification PC IOL in good position, trace Posterior capsular opacification   Anterior Vitreous Vitreous syneresis, Posterior vitreous detachment Vitreous syneresis         Fundus Exam       Right Left   Disc mild pallor, sharp rim, mild PPA, +cupping Pink and Sharp, PPA   C/D Ratio 0.75 0.7   Macula Flat, Good foveal reflex, fine Drusen, mild RPE mottling, No heme or edema Flat, Good foveal reflex, Drusen, RPE mottling and clumping, No heme or edema   Vessels mild attenuation attenuated, Tortuous   Periphery Attached, focal peripheral drusen at 0300, no heme Attached, focal dot heme at 0530 - resolved           Refraction     Wearing Rx       Sphere Cylinder Axis Add   Right [place] +1.25 110 +2.50   Left [place] +1.00 097 +2.50           IMAGING AND PROCEDURES  Imaging and Procedures for 07/25/2022  OCT, Retina  -  OU - Both Eyes       Right Eye Quality was good. Central Foveal Thickness: 276. Progression has been stable. Findings include normal foveal contour, no IRF, no SRF, retinal drusen .   Left Eye Quality was good. Central Foveal Thickness: 277. Progression has been stable. Findings include normal foveal contour, no IRF, no SRF, retinal drusen (Partial PVD).   Notes *Images captured and stored on drive  Diagnosis / Impression:  Non-exu ARMD OU NFP, no IRF/SRF OU Stable drusen OU  Clinical management:  See below  Abbreviations: NFP - Normal foveal profile. CME - cystoid macular edema. PED - pigment epithelial detachment. IRF - intraretinal fluid. SRF - subretinal fluid. EZ - ellipsoid zone. ERM - epiretinal membrane. ORA - outer retinal atrophy. ORT - outer retinal tubulation. SRHM - subretinal hyper-reflective material. IRHM - intraretinal hyper-reflective material               ASSESSMENT/PLAN:    ICD-10-CM   1. Intermediate stage nonexudative age-related macular degeneration of both eyes  H35.3132 OCT, Retina - OU - Both Eyes    2. Ocular migraine  G43.109     3. Essential hypertension  I10     4. Hypertensive retinopathy of both eyes  H35.033     5. Pseudophakia, both eyes  Z96.1      1. Age related macular degeneration, non-exudative, OU  - intermediate stage -- stable  - no exudative disease on exam or OCT  - The incidence, anatomy, and pathology of dry AMD, risk of progression, and the AREDS and AREDS 2 studies including smoking risks discussed with patient.  - Recommend amsler grid monitoring  - f/u 9 months, DFE, OCT  2. Ocular migraine v migraine w/ aura  - pt with known history of migraines w/ aura, but auras have changed from wavy lines and colors to a symmetrical object with black lines emanating from it  - eye exam without ophthalmic findings to explain symptoms -- making migraine mechanism more likely  - no retinal or ophthalmic interventions  indicated or recommended  - recommend discussing with neurologist for further recommendations and input  3,4. Hypertensive retinopathy OU - discussed importance of tight BP control - monitor  5. Pseudophakia OU  - s/p CE/IOL OU (Dr. Zetta Bills)  - IOLs in good position, doing well  - monitor  Ophthalmic Meds Ordered this visit:  No orders of the defined types were placed in this encounter.    Return in about 9 months (around 04/25/2023) for f/u non-exu ARMD OU, DFE, OCT.  There are no Patient Instructions on file for this visit.   Explained the diagnoses, plan, and follow up with the patient and they expressed understanding.  Patient expressed understanding of the importance of proper follow up care.   This document serves as a record of services personally performed by Karie Chimera, MD, PhD. It was created on their behalf by Gerilyn Nestle, COT an ophthalmic technician. The creation of this record is the provider's dictation and/or activities during the visit.    Electronically signed by:  Gerilyn Nestle, COT 07/11/22 9:10 PM  This document serves as a record of services personally performed by Karie Chimera, MD, PhD. It was created on their behalf by Glee Arvin. Manson Passey, OA an ophthalmic technician. The creation of this record is the provider's dictation and/or activities during the visit.    Electronically signed by: Glee Arvin. [place], [place] 08.01.2023 9:10 PM  Karie Chimera, M.D., Ph.D. Diseases &  Surgery of the Retina and Vitreous [facility]  I have reviewed the above documentation for accuracy and completeness, and I agree with the above. Karie Chimera, M.D., Ph.D. 07/25/22 9:11 PM   Abbreviations: M myopia (nearsighted); A astigmatism; H hyperopia (farsighted); P presbyopia; Mrx spectacle prescription;  CTL contact lenses; OD right eye; OS left eye; OU both eyes  XT exotropia; ET esotropia; PEK punctate epithelial keratitis; PEE punctate  epithelial erosions; DES dry eye syndrome; MGD meibomian gland dysfunction; ATs artificial tears; PFAT's preservative free artificial tears; NSC nuclear sclerotic cataract; PSC posterior subcapsular cataract; ERM epi-retinal membrane; PVD posterior vitreous detachment; RD retinal detachment; DM diabetes mellitus; DR diabetic retinopathy; NPDR non-proliferative diabetic retinopathy; PDR proliferative diabetic retinopathy; CSME clinically significant macular edema; DME diabetic macular edema; dbh dot blot hemorrhages; CWS cotton wool spot; POAG primary open angle glaucoma; C/D cup-to-disc ratio; HVF humphrey visual field; GVF goldmann visual field; OCT optical coherence tomography; IOP intraocular pressure; BRVO Branch retinal vein occlusion; CRVO central retinal vein occlusion; CRAO central retinal artery occlusion; BRAO branch retinal artery occlusion; RT retinal tear; SB scleral buckle; PPV pars plana vitrectomy; VH Vitreous hemorrhage; PRP panretinal laser photocoagulation; IVK intravitreal kenalog; VMT vitreomacular traction; MH Macular hole;  NVD neovascularization of the disc; NVE neovascularization elsewhere; AREDS age related eye disease study; ARMD age related macular degeneration; POAG primary open angle glaucoma; EBMD epithelial/anterior basement membrane dystrophy; ACIOL anterior chamber intraocular lens; IOL intraocular lens; PCIOL posterior chamber intraocular lens; Phaco/IOL phacoemulsification with intraocular lens placement; PRK photorefractive keratectomy; LASIK laser assisted in situ keratomileusis; HTN hypertension; DM diabetes mellitus; COPD chronic obstructive pulmonary disease

## 2022-07-25 ENCOUNTER — Ambulatory Visit (INDEPENDENT_AMBULATORY_CARE_PROVIDER_SITE_OTHER): Payer: Medicare Other | Admitting: Ophthalmology

## 2022-07-25 ENCOUNTER — Encounter (INDEPENDENT_AMBULATORY_CARE_PROVIDER_SITE_OTHER): Payer: Self-pay | Admitting: Ophthalmology

## 2022-07-25 DIAGNOSIS — H353132 Nonexudative age-related macular degeneration, bilateral, intermediate dry stage: Secondary | ICD-10-CM

## 2022-07-25 DIAGNOSIS — H35033 Hypertensive retinopathy, bilateral: Secondary | ICD-10-CM

## 2022-07-25 DIAGNOSIS — I10 Essential (primary) hypertension: Secondary | ICD-10-CM

## 2022-07-25 DIAGNOSIS — Z961 Presence of intraocular lens: Secondary | ICD-10-CM

## 2022-07-25 DIAGNOSIS — G43109 Migraine with aura, not intractable, without status migrainosus: Secondary | ICD-10-CM | POA: Diagnosis not present

## 2022-08-31 ENCOUNTER — Other Ambulatory Visit: Payer: Self-pay

## 2022-08-31 ENCOUNTER — Emergency Department (HOSPITAL_BASED_OUTPATIENT_CLINIC_OR_DEPARTMENT_OTHER): Payer: Medicare Other

## 2022-08-31 ENCOUNTER — Encounter (HOSPITAL_BASED_OUTPATIENT_CLINIC_OR_DEPARTMENT_OTHER): Payer: Self-pay

## 2022-08-31 ENCOUNTER — Emergency Department (HOSPITAL_BASED_OUTPATIENT_CLINIC_OR_DEPARTMENT_OTHER)
Admission: EM | Admit: 2022-08-31 | Discharge: 2022-09-01 | Disposition: A | Discharge status: Home / Self Care | Payer: Medicare Other | Attending: Emergency Medicine | Admitting: Emergency Medicine

## 2022-08-31 DIAGNOSIS — I951 Orthostatic hypotension: Secondary | ICD-10-CM | POA: Insufficient documentation

## 2022-08-31 DIAGNOSIS — J9811 Atelectasis: Secondary | ICD-10-CM | POA: Insufficient documentation

## 2022-08-31 DIAGNOSIS — R55 Syncope and collapse: Secondary | ICD-10-CM

## 2022-08-31 DIAGNOSIS — Z79899 Other long term (current) drug therapy: Secondary | ICD-10-CM | POA: Insufficient documentation

## 2022-08-31 DIAGNOSIS — R9431 Abnormal electrocardiogram [ECG] [EKG]: Secondary | ICD-10-CM | POA: Insufficient documentation

## 2022-08-31 DIAGNOSIS — R42 Dizziness and giddiness: Secondary | ICD-10-CM | POA: Diagnosis present

## 2022-08-31 LAB — CBC WITH DIFFERENTIAL/PLATELET
Abs Immature Granulocytes: 0.03 10*3/uL (ref 0.00–0.07)
Basophils Absolute: 0 10*3/uL (ref 0.0–0.1)
Basophils Relative: 1 %
Eosinophils Absolute: 0.2 10*3/uL (ref 0.0–0.5)
Eosinophils Relative: 3 %
HCT: 43.7 % (ref 36.0–46.0)
Hemoglobin: 14 g/dL (ref 12.0–15.0)
Immature Granulocytes: 0 %
Lymphocytes Relative: 31 %
Lymphs Abs: 2.4 10*3/uL (ref 0.7–4.0)
MCH: 28.8 pg (ref 26.0–34.0)
MCHC: 32 g/dL (ref 30.0–36.0)
MCV: 89.9 fL (ref 80.0–100.0)
Monocytes Absolute: 0.7 10*3/uL (ref 0.1–1.0)
Monocytes Relative: 10 %
Neutro Abs: 4.2 10*3/uL (ref 1.7–7.7)
Neutrophils Relative %: 55 %
Platelets: 216 10*3/uL (ref 150–400)
RBC: 4.86 MIL/uL (ref 3.87–5.11)
RDW: 13.4 % (ref 11.5–15.5)
WBC: 7.6 10*3/uL (ref 4.0–10.5)
nRBC: 0 % (ref 0.0–0.2)

## 2022-08-31 LAB — BASIC METABOLIC PANEL
Anion gap: 9 (ref 5–15)
BUN: 22 mg/dL (ref 8–23)
CO2: 27 mmol/L (ref 22–32)
Calcium: 9 mg/dL (ref 8.9–10.3)
Chloride: 102 mmol/L (ref 98–111)
Creatinine, Ser: 1.05 mg/dL — ABNORMAL HIGH (ref 0.44–1.00)
GFR, Estimated: 58 mL/min — ABNORMAL LOW (ref >60)
Glucose, Bld: 122 mg/dL — ABNORMAL HIGH (ref 70–99)
Potassium: 4.2 mmol/L (ref 3.5–5.1)
Sodium: 138 mmol/L (ref 135–145)

## 2022-08-31 LAB — CBG MONITORING, ED: Glucose-Capillary: 129 mg/dL — ABNORMAL HIGH (ref 70–99)

## 2022-08-31 LAB — PROTIME-INR
INR: 1 (ref 0.8–1.2)
Prothrombin Time: 13 seconds (ref 11.4–15.2)

## 2022-08-31 LAB — APTT: aPTT: 27 seconds (ref 24–36)

## 2022-08-31 LAB — TROPONIN I (HIGH SENSITIVITY): Troponin I (High Sensitivity): 3 ng/L (ref <18)

## 2022-08-31 LAB — LACTIC ACID, PLASMA: Lactic Acid, Venous: 1.6 mmol/L (ref 0.5–1.9)

## 2022-08-31 MED ORDER — LACTATED RINGERS IV BOLUS
1000.0000 mL | Freq: Once | INTRAVENOUS | Status: AC
  Administered 2022-08-31: 1000 mL via INTRAVENOUS

## 2022-08-31 NOTE — ED Provider Notes (Signed)
[facility] EMERGENCY DEPT Provider Note   CSN: [id number] Arrival date & time: 08/31/22  2217     History  Chief Complaint  Patient presents with   Near Syncope    Robin Robles is a 66 y.o. female.  66 year old female with a history of migraines on propranolol and several other medications who presents to the emergency department with dizziness.  Patient states that for the past 2 to 3 days she has felt lightheaded with standing.  Says that today it worsened.  Denies any syncopal episodes.  Says that she took her blood pressure at home which was 79/56 today.  This prompted her to come into the emergency department for evaluation.  Says that she has not had any new medication changes and does not take any antihypertensives aside from her propranolol.  Says that she last took her propranolol at 8 PM.  Says that she has been taking an adequate p.o. intake.  Denies any fevers, cough, chest pain, shortness of breath, dysuria or frequency.  No vaginal bleeding, melena, or hematochezia.  Says that she does have a mild headache that is common for her migraines no neck stiffness or numbness or weakness of her arms or legs.   Near Syncope       Home Medications Prior to Admission medications   Medication Sig Start Date End Date Taking? Authorizing Provider  buPROPion (WELLBUTRIN XL) 150 MG 24 hr tablet Take by mouth. 05/07/20   [provider]  diclofenac (VOLTAREN) 75 MG EC tablet Take 75 mg by mouth 2 (two) times daily.    [provider]  dicyclomine (BENTYL) 20 MG tablet Take 1 tablet (20 mg total) by mouth 2 (two) times daily. 10/23/18   Mortis, Jerrel Ivory I, PA-C  EPINEPHrine 0.3 mg/0.3 mL IJ SOAJ injection Inject into the muscle. 06/06/17   [provider]  famotidine (PEPCID) 20 MG tablet TAKE 1 TABLET BY MOUTH AT BEDTIME Patient not taking: Reported on 10/23/2018 10/30/17   Nyoka Cowden, MD  meloxicam (MOBIC) 15 MG tablet Take 1 tablet (15  mg total) by mouth daily. 05/10/20   Wallis Bamberg, PA-C  omeprazole (PRILOSEC) 20 MG capsule Take 20 mg by mouth daily. As needed    [provider]  pantoprazole (PROTONIX) 40 MG tablet TAKE 1 TABLET BY MOUTH DAILY TAKE 30 TO 60 MIN BEFORE FIRST MEAL OF DAY Patient not taking: Reported on 10/23/2018 10/30/17   Nyoka Cowden, MD  pregabalin (LYRICA) 50 MG capsule Take 100 mg by mouth at bedtime.     [provider]  propranolol (INDERAL) 40 MG tablet Take 40 mg by mouth every evening.    [provider]  sertraline (ZOLOFT) 100 MG tablet Take 200 mg by mouth every morning.     [provider]  topiramate (TOPAMAX) 25 MG tablet Take 50 mg by mouth at bedtime.     [provider]  traMADol Janean Sark) 50 MG tablet 1-2 every 4 hours as needed for cough or pain Patient not taking: Reported on 10/23/2018 07/31/17   Nyoka Cowden, MD      Allergies    Bee venom, Codeine, and Oxycodone-acetaminophen    Review of Systems   Review of Systems  Cardiovascular:  Positive for near-syncope.    Physical Exam Updated Vital Signs BP 120/68   Pulse 64   Temp 97.7 F (36.5 C) (Oral)   Resp 12   Ht 5\' 2"  (1.575 m)   Wt 74.8 kg  SpO2 94%   BMI 30.18 kg/m  Physical Exam Vitals and nursing note reviewed.  Constitutional:      General: She is not in acute distress.    Appearance: She is well-developed.  HENT:     Head: Normocephalic and atraumatic.     Right Ear: External ear normal.     Left Ear: External ear normal.     Nose: Nose normal.  Eyes:     Extraocular Movements: Extraocular movements intact.     Conjunctiva/sclera: Conjunctivae normal.     Pupils: Pupils are equal, round, and reactive to light.  Cardiovascular:     Rate and Rhythm: Normal rate and regular rhythm.     Heart sounds: No murmur heard. Pulmonary:     Effort: Pulmonary effort is normal. No respiratory distress.     Breath sounds: Normal breath sounds.  Abdominal:      General: Abdomen is flat. There is no distension.     Palpations: Abdomen is soft. There is no mass.     Tenderness: There is no abdominal tenderness. There is no guarding.  Musculoskeletal:        General: No swelling.     Cervical back: Normal range of motion and neck supple.     Right lower leg: No edema.     Left lower leg: No edema.  Skin:    General: Skin is warm and dry.     Capillary Refill: Capillary refill takes less than 2 seconds.  Neurological:     Mental Status: She is alert and oriented to person, place, and time. Mental status is at baseline.  Psychiatric:        Mood and Affect: Mood normal.     ED Results / Procedures / Treatments   Labs (all labs ordered are listed, but only abnormal results are displayed) Labs Reviewed  BASIC METABOLIC PANEL - Abnormal; Notable for the following components:      Result Value   Glucose, Bld 122 (*)    Creatinine, Ser 1.05 (*)    GFR, Estimated 58 (*)    All other components within normal limits  CBG MONITORING, ED - Abnormal; Notable for the following components:   Glucose-Capillary 129 (*)    All other components within normal limits  CBC WITH DIFFERENTIAL/PLATELET  APTT  PROTIME-INR  LACTIC ACID, PLASMA  URINALYSIS, ROUTINE W REFLEX MICROSCOPIC  TROPONIN I (HIGH SENSITIVITY)  TROPONIN I (HIGH SENSITIVITY)    EKG EKG Interpretation  Date/Time:  Thursday August 31 2022 22:43:48 EDT Ventricular Rate:  67 PR Interval:  153 QRS Duration: 86 QT Interval:  391 QTC Calculation: 413 R Axis:   89 Text Interpretation: Sinus rhythm Borderline right axis deviation Low voltage, precordial leads Minimal ST elevation, inferior leads Confirmed by Vonita Moss [phone]) on 08/31/2022 10:47:09 PM  Radiology DG Chest 1 View  Result Date: 08/31/2022 CLINICAL DATA:  Presyncope hypotension EXAM: CHEST  1 VIEW COMPARISON:  11/20/2016 FINDINGS: The heart size and mediastinal contours are within normal limits. Aortic  atherosclerosis. Possible small left effusion. Subsegmental atelectasis at the left base. The visualized skeletal structures are unremarkable. IMPRESSION: Possible small left effusion with subsegmental atelectasis at the left base. Electronically Signed   By: Jasmine Pang M.D.   On: 08/31/2022 23:07    Procedures Procedures   Medications Ordered in ED Medications  lactated ringers bolus 1,000 mL (0 mLs Intravenous Stopped 09/01/22 0001)    ED Course/ Medical Decision Making/ A&P Clinical Course as of 09/01/22 2301  Fri Sep 01, 2022  0111 Signed out to Dr Bernette Mayers.  [RP]    Clinical Course User Index [RP] Rondel Baton, MD                           Medical Decision Making Amount and/or Complexity of Data Reviewed Labs: ordered. Radiology: ordered. ECG/medicine tests: ordered.   KAPRICE KAGE is a 66 year old female with a history of migraines on propranolol and several other medications who presents to the emergency department with dizziness.   Initial Ddx:  Hypovolemia, orthostasis, medication side effect, shock  MDM:  Feel the patient likely has a combination of hypovolemia and medication side effects are causing her hypotension and orthostatic symptoms.  Did consider shock on the differential but patient is overall well-appearing has good cap refill and her blood pressure spontaneously improved to 100 systolic without intervention.  Denies any infectious symptoms or chest pain.  No bleeding recently.  No GI symptoms.  Plan:  Labs Troponin Lactate EKG Chest x-ray IV fluids  ED Summary:  Patient underwent the above work-up that was reassuring.  Lactic acid was WNL.  Her blood pressure continued to improve with the fluids.  Initial troponin was WNL and her EKG was reassuring.  The patient was signed out to the oncoming physician awaiting second troponin but feels the patient will likely be able to follow-up with her primary doctor and discuss discontinuing her  propranolol.  Dispo: Pending remainder of workup   Additional history obtained from spouse Records reviewed OP Notes The following labs were independently interpreted: Chemistry I independently visualized the following imaging with scope of interpretation limited to determining acute life threatening conditions related to emergency care: Chest x-ray, which revealed no acute abnormality   Final Clinical Impression(s) / ED Diagnoses Final diagnoses:  Near syncope  Orthostatic hypotension    Rx / DC Orders ED Discharge Orders     None         Rondel Baton, MD 09/01/22 2301

## 2022-08-31 NOTE — ED Triage Notes (Signed)
Last 3 days she has felt dizzy, near syncope this evening where the room became black. BP at home was 70s systolic per spouse. Has hx of migraines and states has one now.

## 2022-08-31 NOTE — ED Notes (Signed)
Pt awake and alert; GCS 15.  Pt wearing dark sunglasses reports ongoing R anterior migraine rated 2-3 described as aching/throbbing.  RR even and unlabored on RA with symmetrical rise and fall of chest -- O2 sats 92% on RA.  Cardiac monitoring maintained; NSR HR 63.  Pt remains hypotensive; pt is symptomatic; reports dizziness with activity- 1L LR bolus now infusing to 20G R FA; dressing dry and intact.

## 2022-09-01 LAB — URINALYSIS, ROUTINE W REFLEX MICROSCOPIC
Bilirubin Urine: NEGATIVE
Glucose, UA: NEGATIVE mg/dL
Hgb urine dipstick: NEGATIVE
Ketones, ur: NEGATIVE mg/dL
Leukocytes,Ua: NEGATIVE
Nitrite: NEGATIVE
Protein, ur: NEGATIVE mg/dL
Specific Gravity, Urine: 1.009 (ref 1.005–1.030)
pH: 6 (ref 5.0–8.0)

## 2022-09-01 LAB — TROPONIN I (HIGH SENSITIVITY): Troponin I (High Sensitivity): 3 ng/L (ref <18)

## 2022-09-01 NOTE — Discharge Instructions (Signed)
Please hold your propranolol over the weekend and see if your symptoms are better.

## 2022-09-01 NOTE — ED Notes (Addendum)
Late entry-- Pt continues to lie awake in bed; calm and quiet- GCS 15.  No acute changes -- pt bp normalizing -- pt reports she has no dizziness in bed at rest; unk if dizziness has went away without changing positions.  Pt reporting also at this time that migraine has improved - now 1/10 to R anterior region - continues to describe as pressure.  Pt requesting snack; per Dr. Bernette Mayers pt ok to have snack as she awaits UA and repeat trop results. Trail mix and ice water has been given.  Spouse remains at bedside.

## 2022-09-01 NOTE — ED Provider Notes (Signed)
Care of the patient assumed at the change of shift. Here for near-syncope and hypotension. Improved with IVF. Labs are unremarkable including repeat Trop. She is on propranolol for migraines, advised she hold this over the weekend and monitor her BP to see if it remains stable. Follow up with PCP. RTED for any other concerns.    Pollyann Savoy, MD 09/01/22 [phone]

## 2022-09-01 NOTE — ED Notes (Signed)
Late entry Per Dr. Bernette Mayers have pt stand and ambulate to re-assess for dizziness with activity; pt able to reposition from lying to sitting and sitting to standing without assistance; does require min 1 person assist for safety when ambulating; pt reporting very mild dizziness when ambulating from bed to door and back to bed; then from bed to sink and back to bed-- said she felt a lot better when compared to how she felt when she arrived- this nurse has recommended that she has 1 person assist for safety when ambulating at home (if dizziness remains present) - pt states husband is with her at all times - pt did confirm she has a walker at home -- encouraged pt to use walker however pt reports husband would be helping her; encouraged pt then to use walker as back up

## 2022-09-22 ENCOUNTER — Other Ambulatory Visit: Payer: Self-pay

## 2022-09-22 ENCOUNTER — Observation Stay (HOSPITAL_COMMUNITY)
Admission: EM | Admit: 2022-09-22 | Discharge: 2022-09-23 | Disposition: A | Discharge status: Home / Self Care | Payer: Medicare Other | Attending: Family Medicine | Admitting: Family Medicine

## 2022-09-22 DIAGNOSIS — Z79899 Other long term (current) drug therapy: Secondary | ICD-10-CM | POA: Insufficient documentation

## 2022-09-22 DIAGNOSIS — I10 Essential (primary) hypertension: Secondary | ICD-10-CM | POA: Diagnosis not present

## 2022-09-22 DIAGNOSIS — G43911 Migraine, unspecified, intractable, with status migrainosus: Secondary | ICD-10-CM

## 2022-09-22 DIAGNOSIS — R202 Paresthesia of skin: Secondary | ICD-10-CM | POA: Diagnosis present

## 2022-09-22 DIAGNOSIS — I639 Cerebral infarction, unspecified: Principal | ICD-10-CM | POA: Insufficient documentation

## 2022-09-22 DIAGNOSIS — F32A Depression, unspecified: Secondary | ICD-10-CM

## 2022-09-22 LAB — DIFFERENTIAL
Abs Immature Granulocytes: 0.02 10*3/uL (ref 0.00–0.07)
Basophils Absolute: 0.1 10*3/uL (ref 0.0–0.1)
Basophils Relative: 1 %
Eosinophils Absolute: 0.1 10*3/uL (ref 0.0–0.5)
Eosinophils Relative: 2 %
Immature Granulocytes: 0 %
Lymphocytes Relative: 30 %
Lymphs Abs: 2.2 10*3/uL (ref 0.7–4.0)
Monocytes Absolute: 0.7 10*3/uL (ref 0.1–1.0)
Monocytes Relative: 9 %
Neutro Abs: 4.4 10*3/uL (ref 1.7–7.7)
Neutrophils Relative %: 58 %

## 2022-09-22 LAB — COMPREHENSIVE METABOLIC PANEL
ALT: 19 U/L (ref 0–44)
AST: 23 U/L (ref 15–41)
Albumin: 3.8 g/dL (ref 3.5–5.0)
Alkaline Phosphatase: 85 U/L (ref 38–126)
Anion gap: 9 (ref 5–15)
BUN: 20 mg/dL (ref 8–23)
CO2: 23 mmol/L (ref 22–32)
Calcium: 9 mg/dL (ref 8.9–10.3)
Chloride: 106 mmol/L (ref 98–111)
Creatinine, Ser: 0.77 mg/dL (ref 0.44–1.00)
GFR, Estimated: >60 mL/min (ref >60)
Glucose, Bld: 103 mg/dL — ABNORMAL HIGH (ref 70–99)
Potassium: 4.2 mmol/L (ref 3.5–5.1)
Sodium: 138 mmol/L (ref 135–145)
Total Bilirubin: 0.4 mg/dL (ref 0.3–1.2)
Total Protein: 6.4 g/dL — ABNORMAL LOW (ref 6.5–8.1)

## 2022-09-22 LAB — PROTIME-INR
INR: 0.9 (ref 0.8–1.2)
Prothrombin Time: 12.4 seconds (ref 11.4–15.2)

## 2022-09-22 LAB — I-STAT CHEM 8, ED
BUN: 23 mg/dL (ref 8–23)
Calcium, Ion: 1.12 mmol/L — ABNORMAL LOW (ref 1.15–1.40)
Chloride: 105 mmol/L (ref 98–111)
Creatinine, Ser: 0.7 mg/dL (ref 0.44–1.00)
Glucose, Bld: 98 mg/dL (ref 70–99)
HCT: 45 % (ref 36.0–46.0)
Hemoglobin: 15.3 g/dL — ABNORMAL HIGH (ref 12.0–15.0)
Potassium: 4.1 mmol/L (ref 3.5–5.1)
Sodium: 138 mmol/L (ref 135–145)
TCO2: 23 mmol/L (ref 22–32)

## 2022-09-22 LAB — URINALYSIS, ROUTINE W REFLEX MICROSCOPIC
Bilirubin Urine: NEGATIVE
Glucose, UA: NEGATIVE mg/dL
Hgb urine dipstick: NEGATIVE
Ketones, ur: NEGATIVE mg/dL
Leukocytes,Ua: NEGATIVE
Nitrite: NEGATIVE
Protein, ur: NEGATIVE mg/dL
Specific Gravity, Urine: 1.011 (ref 1.005–1.030)
pH: 5 (ref 5.0–8.0)

## 2022-09-22 LAB — CBC
HCT: 43 % (ref 36.0–46.0)
Hemoglobin: 14.3 g/dL (ref 12.0–15.0)
MCH: 30 pg (ref 26.0–34.0)
MCHC: 33.3 g/dL (ref 30.0–36.0)
MCV: 90.1 fL (ref 80.0–100.0)
Platelets: 225 10*3/uL (ref 150–400)
RBC: 4.77 MIL/uL (ref 3.87–5.11)
RDW: 13.5 % (ref 11.5–15.5)
WBC: 7.4 10*3/uL (ref 4.0–10.5)
nRBC: 0 % (ref 0.0–0.2)

## 2022-09-22 LAB — APTT: aPTT: 26 seconds (ref 24–36)

## 2022-09-22 LAB — ETHANOL: Alcohol, Ethyl (B): <10 mg/dL (ref <10)

## 2022-09-22 LAB — RAPID URINE DRUG SCREEN, HOSP PERFORMED
Amphetamines: NOT DETECTED
Barbiturates: NOT DETECTED
Benzodiazepines: NOT DETECTED
Cocaine: NOT DETECTED
Opiates: NOT DETECTED
Tetrahydrocannabinol: NOT DETECTED

## 2022-09-22 MED ORDER — PROCHLORPERAZINE EDISYLATE 10 MG/2ML IJ SOLN
10.0000 mg | Freq: Once | INTRAMUSCULAR | Status: AC
  Administered 2022-09-22: 10 mg via INTRAVENOUS
  Filled 2022-09-22: qty 2

## 2022-09-22 MED ORDER — ACETAMINOPHEN 500 MG PO TABS
1000.0000 mg | ORAL_TABLET | Freq: Three times a day (TID) | ORAL | Status: DC | PRN
  Administered 2022-09-22: 1000 mg via ORAL
  Filled 2022-09-22: qty 2

## 2022-09-22 MED ORDER — DIPHENHYDRAMINE HCL 50 MG/ML IJ SOLN
25.0000 mg | Freq: Once | INTRAMUSCULAR | Status: AC
  Administered 2022-09-22: 25 mg via INTRAVENOUS
  Filled 2022-09-22: qty 1

## 2022-09-22 MED ORDER — LACTATED RINGERS IV BOLUS
1000.0000 mL | Freq: Once | INTRAVENOUS | Status: AC
  Administered 2022-09-22: 1000 mL via INTRAVENOUS

## 2022-09-22 NOTE — ED Triage Notes (Signed)
Pt sent here for admission after MRI showed acute infarct. Pt reports headache  x1 year and couldn't get in w/ neuro. Had an outpatient MRI today.

## 2022-09-22 NOTE — ED Provider Triage Note (Signed)
Emergency Medicine Provider Triage Evaluation Note  Robin Robles , a 66 y.o. female  was evaluated in triage.  Pt complains of patient sent in for acute stroke.  She had an outpatient MRI ordered for intractable migraine headaches and was shown to have acute/subacute infarct on an outpatient MRI ordered at [facility].  She has no no neurodeficits.  Her MRI was done this past Wednesday..  Review of Systems  Positive: Acute stroke Negative: Weakness or vision change  Physical Exam  BP 132/77   Pulse 76   Temp 97.8 F (36.6 C) (Oral)   Resp 16   SpO2 97%  Gen:   Awake, no distress   Resp:  Normal effort MSK:   Moves extremities without difficulty  Other:  Photophobic  Medical Decision Making  Medically screening exam initiated at 5:24 PM.  Appropriate orders placed.  Neoma Laming was informed that the remainder of the evaluation will be completed by another provider, this initial triage assessment does not replace that evaluation, and the importance of remaining in the ED until their evaluation is complete.  Work up initiated   [employer], PA-C 09/22/22 1725

## 2022-09-22 NOTE — ED Provider Notes (Signed)
[facility] EMERGENCY DEPARTMENT Provider Note   CSN: [id number] Arrival date & time: 09/22/22  1656     History {Add pertinent medical, surgical, social history, OB history to HPI:1} Chief Complaint  Patient presents with   Cerebrovascular Accident    Robin Robles is a 66 y.o. female with HTN, fibromyalgia, migraines, glaucoma presents with headaches, concern for acute stroke.   Patient reports R-sided migraines on and off since February. +Photosensitivity. She has had migraines since she was a child but they would not normally be only on the R side, which is unusual for her. Denies nausea/vomiting. Endorses numbness/tingling over her upper lip and bilateral hands sometimes. Endorses "stumbling a lot," has not fallen or hit her head. Headaches occurring almost every day. HAs tried propranolol, tizanidine, and eletriptan which haven't worked. Had a Vyepti (eptinezumab-jjmr) infusion last week. Current headache rated 4/10. Tried aleve today.   Pt complains of patient sent in for acute stroke.  She had an outpatient MRI ordered for intractable migraine headaches and was shown to have acute/subacute infarct on an outpatient MRI ordered at [facility].  She has no no neurodeficits.  Her MRI was done this past Wednesday and received a call today to come to ED for c/f CVA.  [facility] per care everywhere, MRI brain w/ and w/o contrast: IMPRESSION:  1.  Tiny acute infarction in the posterior RIGHT temporal lobe and possibly another tiny acute/subacute infarction in the RIGHT frontal lobe.  2.  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      Cerebrovascular Accident       Home Medications Prior to Admission medications   Medication Sig Start Date End Date Taking? Authorizing Provider  buPROPion (WELLBUTRIN XL) 150 MG 24 hr tablet Take 150 mg by mouth daily. 05/07/20  Yes [provider]  dicyclomine (BENTYL) 20 MG tablet Take 1  tablet (20 mg total) by mouth 2 (two) times daily. Patient taking differently: Take 20 mg by mouth daily as needed for spasms. 10/23/18  Yes Mortis, Sharyon Medicus, PA-C  omeprazole (PRILOSEC) 20 MG capsule Take 20 mg by mouth daily as needed (for acid reflux).   Yes [provider]  pregabalin (LYRICA) 75 MG capsule Take 150 mg by mouth at bedtime.   Yes [provider]  propranolol (INDERAL) 40 MG tablet Take 40 mg by mouth every evening.   Yes [provider]  sertraline (ZOLOFT) 100 MG tablet Take 200 mg by mouth every morning.    Yes [provider]  venlafaxine XR (EFFEXOR-XR) 37.5 MG 24 hr capsule Take 37.5 mg by mouth at bedtime. for migraines   Yes [provider]  EPINEPHrine 0.3 mg/0.3 mL IJ SOAJ injection Inject into the muscle. Patient not taking: Reported on 09/22/2022 06/06/17   [provider]      Allergies    Bee venom, Codeine, and Oxycodone-acetaminophen    Review of Systems   Review of Systems Review of systems negative for LOC.  A 10 point review of systems was performed and is negative unless otherwise reported in HPI.  Physical Exam Updated Vital Signs BP 130/84   Pulse 75   Temp 98 F (36.7 C) (Oral)   Resp 13   SpO2 91%  Physical Exam General: Normal appearing female, lying in bed in the dark wearing sunglasses. HEENT: PERRLA, Sclera anicteric, MMM, trachea midline. Cardiology: RRR, no murmurs/rubs/gallops. BL radial and DP pulses equal bilaterally.  Resp: Normal respiratory rate and effort.  CTAB, no wheezes, rhonchi, crackles.  Abd: Soft, non-tender, non-distended. No rebound tenderness or guarding.  MSK: No peripheral edema or signs of trauma. Extremities without deformity or TTP. No cyanosis or clubbing. Skin: warm, dry. No rashes or lesions. Neuro: A&Ox4, CNs II-XII grossly intact. MAEs. Sensation grossly intact. Normal coordination. EOMI, no nystagmus. Psych: Normal mood and affect.   ED Results /  Procedures / Treatments   Labs (all labs ordered are listed, but only abnormal results are displayed) Labs Reviewed  COMPREHENSIVE METABOLIC PANEL - Abnormal; Notable for the following components:      Result Value   Glucose, Bld 103 (*)    Total Protein 6.4 (*)    All other components within normal limits  I-STAT CHEM 8, ED - Abnormal; Notable for the following components:   Calcium, Ion 1.12 (*)    Hemoglobin 15.3 (*)    All other components within normal limits  RESP PANEL BY RT-PCR (FLU A&B, COVID) ARPGX2  ETHANOL  PROTIME-INR  APTT  CBC  DIFFERENTIAL  RAPID URINE DRUG SCREEN, HOSP PERFORMED  URINALYSIS, ROUTINE W REFLEX MICROSCOPIC    EKG EKG Interpretation  Date/Time:  Friday September 22 2022 17:48:22 EDT Ventricular Rate:  75 PR Interval:  136 QRS Duration: 80 QT Interval:  376 QTC Calculation: 419 R Axis:   92 Text Interpretation: Normal sinus rhythm Rightward axis NO SIGNIFICANT CHANGE SINCE LAST TRACING YESTERDAY When compared with ECG of 31-Aug-2022 22:43, PREVIOUS ECG IS PRESENT Confirmed by Vivi Barrack [phone]) on 09/22/2022 6:23:02 PM  Radiology No results found.  Procedures Procedures  {Document cardiac monitor, telemetry assessment procedure when appropriate:1}  Medications Ordered in ED Medications  acetaminophen (TYLENOL) tablet 1,000 mg (1,000 mg Oral Given 09/22/22 2143)  lactated ringers bolus 1,000 mL (1,000 mLs Intravenous New Bag/Given 09/22/22 2145)  diphenhydrAMINE (BENADRYL) injection 25 mg (25 mg Intravenous Given 09/22/22 2145)  prochlorperazine (COMPAZINE) injection 10 mg (10 mg Intravenous Given 09/22/22 2144)    ED Course/ Medical Decision Making/ A&P                          Medical Decision Making Risk OTC drugs. Prescription drug management.    Patient sent to ED for MRI reading acute/subacute stroke.  Patient with no prior history of stroke but must assume that these are acute.  Patient has no focal neurodeficits, NIHSS 0.   No indication at this time for stroke code or acute neurologic intervention.  Patient does have current 4/10 headache, will give migraine cocktail and reassess.  No brain tumor, structural abnormality demonstrated on MRI.  Patient is otherwise well-appearing and hemodynamically stable, no fever, given time course no concern for meningitis/encephalitis as cause of headache.   Labs demonstrate normal sodium potassium, creatinine, no leukocytosis, no anemia, PT 12.4, INR 0.9, APTT 26, UA with no UTI.  Alcohol negative.  I have personally reviewed and interpreted all labs and imaging.   Clinical Course as of 09/22/22 2345  Fri Sep 22, 2022  1938 Labs including PT/INR, aPTT, CBC, CMP, UDS, UA, EtOH unremarkable in context of presentation. [HN]  2100 Will give headache cocktail and consult to neurology. [HN]  2345 D/w neuro who recommends admission for stroke w/u. Consulted to hospitalist. [HN]    Clinical Course User Index [HN] Loetta Rough, MD    {Document critical care time when appropriate:1} {Document review of labs and clinical decision tools ie heart score, Chads2Vasc2 etc:1}  {Document your independent review of  radiology images, and any outside records:1} {Document your discussion with family members, caretakers, and with consultants:1} {Document social determinants of health affecting pt's care:1} {Document your decision making why or why not admission, treatments were needed:1} Final Clinical Impression(s) / ED Diagnoses Final diagnoses:  Acute CVA (cerebrovascular accident) ([place])  Intractable migraine with status migrainosus, unspecified migraine type    Rx / DC Orders ED Discharge Orders     None        This note was created using dictation software, which may contain spelling or grammatical errors.

## 2022-09-23 ENCOUNTER — Other Ambulatory Visit: Payer: Self-pay

## 2022-09-23 ENCOUNTER — Observation Stay (HOSPITAL_BASED_OUTPATIENT_CLINIC_OR_DEPARTMENT_OTHER): Payer: Medicare Other

## 2022-09-23 ENCOUNTER — Encounter (HOSPITAL_COMMUNITY): Payer: Self-pay | Admitting: Internal Medicine

## 2022-09-23 ENCOUNTER — Observation Stay (HOSPITAL_COMMUNITY): Payer: Medicare Other

## 2022-09-23 DIAGNOSIS — I6389 Other cerebral infarction: Secondary | ICD-10-CM | POA: Diagnosis not present

## 2022-09-23 DIAGNOSIS — G43911 Migraine, unspecified, intractable, with status migrainosus: Secondary | ICD-10-CM

## 2022-09-23 DIAGNOSIS — F32A Depression, unspecified: Secondary | ICD-10-CM

## 2022-09-23 DIAGNOSIS — I639 Cerebral infarction, unspecified: Secondary | ICD-10-CM | POA: Diagnosis present

## 2022-09-23 DIAGNOSIS — I10 Essential (primary) hypertension: Secondary | ICD-10-CM

## 2022-09-23 LAB — LIPID PANEL
Cholesterol: 247 mg/dL — ABNORMAL HIGH (ref 0–200)
HDL: 42 mg/dL (ref >40)
LDL Cholesterol: 176 mg/dL — ABNORMAL HIGH (ref 0–99)
Total CHOL/HDL Ratio: 5.9 RATIO
Triglycerides: 145 mg/dL (ref <150)
VLDL: 29 mg/dL (ref 0–40)

## 2022-09-23 LAB — ECHOCARDIOGRAM COMPLETE
Area-P 1/2: 3.27 cm2
Height: 62 in
S' Lateral: 2.9 cm
Weight: 2716.07 oz

## 2022-09-23 LAB — SEDIMENTATION RATE: Sed Rate: 5 mm/hr (ref 0–22)

## 2022-09-23 LAB — HEMOGLOBIN A1C
Hgb A1c MFr Bld: 5.4 % (ref 4.8–5.6)
Mean Plasma Glucose: 108.28 mg/dL

## 2022-09-23 LAB — C-REACTIVE PROTEIN: CRP: 0.6 mg/dL (ref <1.0)

## 2022-09-23 LAB — HIV ANTIBODY (ROUTINE TESTING W REFLEX): HIV Screen 4th Generation wRfx: NONREACTIVE

## 2022-09-23 MED ORDER — CLOPIDOGREL BISULFATE 75 MG PO TABS
75.0000 mg | ORAL_TABLET | Freq: Every day | ORAL | Status: DC
  Administered 2022-09-23: 75 mg via ORAL
  Filled 2022-09-23: qty 1

## 2022-09-23 MED ORDER — VENLAFAXINE HCL ER 37.5 MG PO CP24
37.5000 mg | ORAL_CAPSULE | Freq: Every day | ORAL | Status: DC
  Filled 2022-09-23 (×4): qty 1

## 2022-09-23 MED ORDER — ASPIRIN 81 MG PO CHEW: 81.0000 mg | CHEWABLE_TABLET | Freq: Once | ORAL | Status: DC

## 2022-09-23 MED ORDER — SERTRALINE HCL 100 MG PO TABS
200.0000 mg | ORAL_TABLET | Freq: Every morning | ORAL | Status: DC
  Administered 2022-09-23: 200 mg via ORAL
  Filled 2022-09-23: qty 2

## 2022-09-23 MED ORDER — ROSUVASTATIN CALCIUM 20 MG PO TABS
40.0000 mg | ORAL_TABLET | Freq: Every day | ORAL | Status: DC
  Administered 2022-09-23: 40 mg via ORAL
  Filled 2022-09-23: qty 2

## 2022-09-23 MED ORDER — ROSUVASTATIN CALCIUM 40 MG PO TABS: 40.0000 mg | ORAL_TABLET | Freq: Every day | ORAL | 0 refills | Status: DC

## 2022-09-23 MED ORDER — IOHEXOL 350 MG/ML SOLN
75.0000 mL | Freq: Once | INTRAVENOUS | Status: AC | PRN
  Administered 2022-09-23: 75 mL via INTRAVENOUS

## 2022-09-23 MED ORDER — BUPROPION HCL ER (XL) 150 MG PO TB24
150.0000 mg | ORAL_TABLET | Freq: Every day | ORAL | Status: DC
  Administered 2022-09-23: 150 mg via ORAL
  Filled 2022-09-23: qty 1

## 2022-09-23 MED ORDER — CLOPIDOGREL BISULFATE 75 MG PO TABS: 75.0000 mg | ORAL_TABLET | Freq: Every day | ORAL | 0 refills | Status: AC

## 2022-09-23 MED ORDER — ASPIRIN 81 MG PO TBEC: 81.0000 mg | DELAYED_RELEASE_TABLET | Freq: Every day | ORAL | 2 refills | Status: DC

## 2022-09-23 MED ORDER — ASPIRIN 81 MG PO TBEC
81.0000 mg | DELAYED_RELEASE_TABLET | Freq: Every day | ORAL | Status: DC
  Administered 2022-09-23: 81 mg via ORAL
  Filled 2022-09-23: qty 1

## 2022-09-23 MED ORDER — STROKE: EARLY STAGES OF RECOVERY BOOK: Freq: Once | Status: DC

## 2022-09-23 MED ORDER — CLOPIDOGREL BISULFATE 75 MG PO TABS: 75.0000 mg | ORAL_TABLET | Freq: Once | ORAL | Status: DC

## 2022-09-23 MED ORDER — ENOXAPARIN SODIUM 40 MG/0.4ML IJ SOSY
40.0000 mg | PREFILLED_SYRINGE | Freq: Every day | INTRAMUSCULAR | Status: DC
  Administered 2022-09-23: 40 mg via SUBCUTANEOUS
  Filled 2022-09-23: qty 0.4

## 2022-09-23 MED ORDER — PREGABALIN 50 MG PO CAPS
150.0000 mg | ORAL_CAPSULE | Freq: Every day | ORAL | Status: DC
  Administered 2022-09-23: 150 mg via ORAL
  Filled 2022-09-23: qty 1

## 2022-09-23 NOTE — H&P (Signed)
History and Physical    Robin Robles [id number] DOB: 1956-07-11 DOA: 09/22/2022  PCP: Chesley Noon, MD  Patient coming from: Home  Chief Complaint: Headache  HPI: Robin Robles is a 66 y.o. female with medical history significant of hypertension, fibromyalgia, migraines, depression, GERD presented to the ED with complaints of right-sided headaches, photosensitivity, intermittent numbness/tingling of her upper lip and bilateral hands and gait instability.  She had an outpatient MRI done which was concerning for acute/subacute stroke and she was sent to the ED for further evaluation.  MRI of the brain done at [place] on 09/20/2022 showing: "IMPRESSION:  1.  Tiny acute infarction in the posterior RIGHT temporal lobe and possibly another tiny acute/subacute infarction in the RIGHT frontal lobe.  2.  Mild leukoaraiosis, most likely due to chronic microvascular disease."  In the ED, vital signs stable.  CBC unremarkable.  No significant abnormalities on CMP.  INR normal, UDS negative, UA without signs of infection, blood ethanol level undetectable. Patient was given Benadryl, Compazine, 1 L LR bolus, and Tylenol. Neurology consulted (Dr. Leonel Ramsay) and recommended admission for stroke work-up.  Patient states she has a history of migraines and is having right-sided headaches since February which are different from her typical migraine headaches.  Headache is severe, 10 out of 10 intensity and feels like someone twisting a knife in her head.  Associated with photophobia, numbness and tingling of both of her hands and upper lip, and also gait instability.  States her PCP has prescribed multiple medications but they have not helped with her headaches, as such, she had a brain MRI done 3 days ago which showed stroke.  She denies history of prior strokes.  Patient states at present her headache has significantly improved after she received medications in the ED.  Review of Systems:  Review  of Systems  All other systems reviewed and are negative.   Past Medical History:  Diagnosis Date   Fibromyalgia    Hypertension    Migraines     Past Surgical History:  Procedure Laterality Date   ABDOMINAL HYSTERECTOMY     CHOLECYSTECTOMY     THYROID SURGERY       reports that she has never smoked. She has never used smokeless tobacco. She reports that she does not drink alcohol and does not use drugs.  Allergies  Allergen Reactions   Bee Venom Anaphylaxis    Throat swelling  Throat swelling    Codeine Other (See Comments)    hallucinations Other reaction(s): Delusions (intolerance), Hallucinations   Oxycodone-Acetaminophen Anxiety    Other reaction(s): Other, Other (See Comments) hyper hyper     Family History  Problem Relation Age of Onset   Heart failure Mother    Heart failure Father     Prior to Admission medications   Medication Sig Start Date End Date Taking? Authorizing Provider  buPROPion (WELLBUTRIN XL) 150 MG 24 hr tablet Take 150 mg by mouth daily. 05/07/20  Yes [provider]  dicyclomine (BENTYL) 20 MG tablet Take 1 tablet (20 mg total) by mouth 2 (two) times daily. Patient taking differently: Take 20 mg by mouth daily as needed for spasms. 10/23/18  Yes Mortis, Jonelle Sports, PA-C  omeprazole (PRILOSEC) 20 MG capsule Take 20 mg by mouth daily as needed (for acid reflux).   Yes [provider]  pregabalin (LYRICA) 75 MG capsule Take 150 mg by mouth at bedtime.   Yes [provider]  propranolol (INDERAL) 40 MG tablet Take  40 mg by mouth every evening.   Yes [provider]  sertraline (ZOLOFT) 100 MG tablet Take 200 mg by mouth every morning.    Yes [provider]  venlafaxine XR (EFFEXOR-XR) 37.5 MG 24 hr capsule Take 37.5 mg by mouth at bedtime. for migraines   Yes [provider]  EPINEPHrine 0.3 mg/0.3 mL IJ SOAJ injection Inject into the muscle. Patient not taking: Reported on 09/22/2022  06/06/17   [provider]    Physical Exam: Vitals:   09/22/22 2200 09/22/22 2315 09/22/22 2330 09/23/22 0000  BP: (!) 143/83 130/84 114/80 118/77  Pulse: 82 75 80 79  Resp: 15 13 12 15   Temp:      TempSrc:      SpO2: 96% 91% 93% 91%    Physical Exam Vitals reviewed.  Constitutional:      General: She is not in acute distress. HENT:     Head: Normocephalic and atraumatic.  Eyes:     Extraocular Movements: Extraocular movements intact.  Cardiovascular:     Rate and Rhythm: Normal rate and regular rhythm.     Pulses: Normal pulses.  Pulmonary:     Effort: Pulmonary effort is normal. No respiratory distress.     Breath sounds: Normal breath sounds. No wheezing or rales.  Abdominal:     General: Bowel sounds are normal. There is no distension.     Palpations: Abdomen is soft.     Tenderness: There is no abdominal tenderness.  Musculoskeletal:        General: No swelling or tenderness.     Cervical back: Normal range of motion.  Skin:    General: Skin is warm and dry.  Neurological:     General: No focal deficit present.     Mental Status: She is alert and oriented to person, place, and time.     Cranial Nerves: No cranial nerve deficit.     Sensory: No sensory deficit.     Motor: No weakness.     Labs on Admission: I have personally reviewed following labs and imaging studies  CBC: Recent Labs  Lab 09/22/22 1727 09/22/22 1801  WBC 7.4  --   NEUTROABS 4.4  --   HGB 14.3 15.3*  HCT 43.0 45.0  MCV 90.1  --   PLT 225  --    Basic Metabolic Panel: Recent Labs  Lab 09/22/22 1727 09/22/22 1801  NA 138 138  K 4.2 4.1  CL 106 105  CO2 23  --   GLUCOSE 103* 98  BUN 20 23  CREATININE 0.77 0.70  CALCIUM 9.0  --    GFR: CrCl cannot be calculated (Unknown ideal weight.). Liver Function Tests: Recent Labs  Lab 09/22/22 1727  AST 23  ALT 19  ALKPHOS 85  BILITOT 0.4  PROT 6.4*  ALBUMIN 3.8   No results for input(s): "LIPASE", "AMYLASE" in the  last 168 hours. No results for input(s): "AMMONIA" in the last 168 hours. Coagulation Profile: Recent Labs  Lab 09/22/22 1727  INR 0.9   Cardiac Enzymes: No results for input(s): "CKTOTAL", "CKMB", "CKMBINDEX", "TROPONINI" in the last 168 hours. BNP (last 3 results) No results for input(s): "PROBNP" in the last 8760 hours. HbA1C: No results for input(s): "HGBA1C" in the last 72 hours. CBG: No results for input(s): "GLUCAP" in the last 168 hours. Lipid Profile: No results for input(s): "CHOL", "HDL", "LDLCALC", "TRIG", "CHOLHDL", "LDLDIRECT" in the last 72 hours. Thyroid Function Tests: No results for input(s): "TSH", "  T4TOTAL", "FREET4", "T3FREE", "THYROIDAB" in the last 72 hours. Anemia Panel: No results for input(s): "VITAMINB12", "FOLATE", "FERRITIN", "TIBC", "IRON", "RETICCTPCT" in the last 72 hours. Urine analysis:    Component Value Date/Time   COLORURINE YELLOW 09/22/2022 [place] 09/22/2022 1727   LABSPEC 1.011 09/22/2022 1727   PHURINE 5.0 09/22/2022 1727   GLUCOSEU NEGATIVE 09/22/2022 1727   HGBUR NEGATIVE 09/22/2022 1727   BILIRUBINUR NEGATIVE 09/22/2022 1727   KETONESUR NEGATIVE 09/22/2022 1727   PROTEINUR NEGATIVE 09/22/2022 1727   UROBILINOGEN 0.2 05/10/2020 1525   NITRITE NEGATIVE 09/22/2022 1727   LEUKOCYTESUR NEGATIVE 09/22/2022 1727    Radiological Exams on Admission: No results found.  EKG: Independently reviewed.  Sinus rhythm, no significant change since prior tracing.  Assessment and Plan  Acute/subacute CVA Patient presenting with complaint of severe right-sided headaches since February associated with photophobia, numbness and tingling of both of her hands and upper lip, and also gait instability.   MRI done at [place] 3 days ago showing tiny acute infarction in the posterior right temporal lobe and possibly another tiny acute/subacute infarction in the right frontal lobe. -Neurology consulted -Telemetry  monitoring -Echocardiogram -Hemoglobin A1c, fasting lipid panel -Antiplatelet therapy per neurology recommendations -Patient was given headache cocktail in the ED with significant improvement.  Continue Tylenol as needed. -Frequent neurochecks -PT, OT, speech therapy. -N.p.o. until cleared by bedside swallow evaluation or formal speech evaluation   Hypertension Currently normotensive. -Allow permissive hypertension at this time  Depression -Continue home medications  DVT prophylaxis: Lovenox Code Status: Full Code (discussed with the patient) Family Communication: No family available at this time. Consults called: Neurology Level of care: Telemetry bed Admission status: It is my clinical opinion that referral for OBSERVATION is reasonable and necessary in this patient based on the above information provided. The aforementioned taken together are felt to place the patient at high risk for further clinical deterioration. However, it is anticipated that the patient may be medically stable for discharge from the hospital within 24 to 48 hours.   Shela Leff MD [facility]  If 7PM-7AM, please contact night-coverage [email or web address]  09/23/2022, 12:41 AM

## 2022-09-23 NOTE — Evaluation (Signed)
Physical Therapy Evaluation Patient Details Name: Robin Robles MRN: [id number] DOB: 02/08/1956 Today's Date: 09/23/2022  History of Present Illness  Pt is a 66 y/o F admitted with temporal and possible frontal lobe strooke.  PMH significant for but not limited to HTN, fibromyalgia, migraines, glaucoma.  Clinical Impression  Pt admitted with above diagnosis.  Pt currently with functional limitations due to the deficits listed below (see PT Problem List). Recommend outpt PT f/u to address balance issues. Pt is at risk for falls, recommended use of assistive device, especially when outdoors.       Recommendations for follow up therapy are one component of a multi-disciplinary discharge planning process, led by the attending physician.  Recommendations may be updated based on patient status, additional functional criteria and insurance authorization.  Follow Up Recommendations Outpatient PT      Assistance Recommended at Discharge Intermittent Supervision/Assistance  Patient can return home with the following       Equipment Recommendations Hospital bed  Recommendations for Other Services       Functional Status Assessment Patient has had a recent decline in their functional status and demonstrates the ability to make significant improvements in function in a reasonable and predictable amount of time.     Precautions / Restrictions Precautions Precautions: Fall Restrictions Weight Bearing Restrictions: No      Mobility  Bed Mobility Overal bed mobility: Independent                  Transfers Overall transfer level: Independent Equipment used: None                    Ambulation/Gait Ambulation/Gait assistance: Min guard Gait Distance (Feet): 150 Feet Assistive device: None Gait Pattern/deviations: Decreased stride length, Step-through pattern, Drifts right/left (occasional drift right)   Gait velocity interpretation:  (Not formally measured.  Likely  close to ythe 1.81 ft/sec threshold)   General Gait Details: right foot caught twice while walking. Tends to reach for objects to hold unto with gait.  Stairs Stairs: Yes Stairs assistance: Supervision Stair Management: One rail Right, Alternating pattern, Forwards Number of Stairs: 3 (Performed twice with no loss of balance)    Wheelchair Mobility    Modified Rankin (Stroke Patients Only) Modified Rankin (Stroke Patients Only) Pre-Morbid Rankin Score: No significant disability Modified Rankin: No significant disability     Balance Overall balance assessment: History of Falls                               Standardized Balance Assessment Standardized Balance Assessment : Dynamic Gait Index   Dynamic Gait Index Level Surface: Mild Impairment Change in Gait Speed: Mild Impairment Gait with Horizontal Head Turns: Moderate Impairment Gait with Vertical Head Turns: Moderate Impairment Gait and Pivot Turn: Mild Impairment Step Over Obstacle: Mild Impairment Step Around Obstacles: Mild Impairment Steps: Mild Impairment Total Score: 14       Pertinent Vitals/Pain Pain Assessment Pain Assessment: 0-10 Pain Score: 2  Pain Location: head Pain Descriptors / Indicators: Discomfort, Headache Pain Intervention(s): Limited activity within patient's tolerance    Home Living Family/patient expects to be discharged to:: Private residence Living Arrangements: Spouse/significant other Available Help at Discharge: Family;Available 24 hours/day Type of Home: House Home Access: Stairs to enter Entrance Stairs-Rails: Can reach both Entrance Stairs-Number of Steps: 2-3 steps Alternate Level Stairs-Number of Steps: full flight Home Layout: Two level;Able to live on main level with bedroom/bathroom  Home Equipment: Shower seat - built in;Grab bars - Statistician (2 wheels);Cane - single point      Prior Function Prior Level of Function : Independent/Modified  Independent;Driving;History of Falls (last six months)             Mobility Comments: Reports one recent fall outdoors       Hand Dominance   Dominant Hand: Right    Extremity/Trunk Assessment   Upper Extremity Assessment Upper Extremity Assessment: Defer to OT evaluation    Lower Extremity Assessment Lower Extremity Assessment: Overall WFL for tasks assessed    Cervical / Trunk Assessment Cervical / Trunk Assessment: Normal  Communication   Communication: No difficulties  Cognition Arousal/Alertness: Awake/alert Behavior During Therapy: WFL for tasks assessed/performed Overall Cognitive Status: Within Functional Limits for tasks assessed                                          General Comments General comments (skin integrity, edema, etc.): With DGI some activities were simulated in the hospital environment so not an exact score    Exercises     Assessment/Plan    PT Assessment All further PT needs can be met in the next venue of care  PT Problem List Decreased balance;Decreased mobility       PT Treatment Interventions      PT Goals (Current goals can be found in the Care Plan section)  Acute Rehab PT Goals Patient Stated Goal: no goal stated    Frequency       Co-evaluation               AM-PAC PT "6 Clicks" Mobility  Outcome Measure Help needed turning from your back to your side while in a flat bed without using bedrails?: None Help needed moving from lying on your back to sitting on the side of a flat bed without using bedrails?: None Help needed moving to and from a bed to a chair (including a wheelchair)?: None Help needed standing up from a chair using your arms (e.g., wheelchair or bedside chair)?: None Help needed to walk in hospital room?: A Little Help needed climbing 3-5 steps with a railing? : A Little 6 Click Score: 22    End of Session Equipment Utilized During Treatment: Gait belt Activity Tolerance:  Patient tolerated treatment well Patient left: in bed;with call bell/phone within reach;with family/visitor present Nurse Communication: Mobility status PT Visit Diagnosis: Unsteadiness on feet (R26.81)    Time: 3358-2518 PT Time Calculation (min) (ACUTE ONLY): 27 min   Charges:   PT Evaluation $PT Eval Moderate Complexity: 1 Mod PT Treatments $Gait Training: 8-22 mins        Lavonia Dana, [place]  Office [phone] 09/23/2022   Melvern Banker 09/23/2022, 2:23 PM

## 2022-09-23 NOTE — Evaluation (Signed)
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  PMH significant for but not limited to HTN, fibromyalgia, migraines, glaucoma.   Clinical Impression   Pt admitted for concerns listed above. PTA pt reported that she was independent with all ADL's and IADL's, including driving. At this time, pt presents with a flat affect, balance deficits, and weakness. She is requiring increased assist for ADL's and functional mobility. Overall at a min guard level and relying on hand held assist or furniture walking during ambulation. Husband is able to provide all needed support at home for ADL's and IADL's. Pt has no further skilled OT needs and acute OT will sign off.       Recommendations for follow up therapy are one component of a multi-disciplinary discharge planning process, led by the attending physician.  Recommendations may be updated based on patient status, additional functional criteria and insurance authorization.   Follow Up Recommendations  No OT follow up    Assistance Recommended at Discharge Intermittent Supervision/Assistance  Patient can return home with the following A little help with walking and/or transfers;A little help with bathing/dressing/bathroom;Assistance with cooking/housework    Functional Status Assessment  Patient has had a recent decline in their functional status and demonstrates the ability to make significant improvements in function in a reasonable and predictable amount of time.  Equipment Recommendations  None recommended by OT    Recommendations for Other Services       Precautions / Restrictions Precautions Precautions: Fall Restrictions Weight Bearing Restrictions: No      Mobility Bed Mobility Overal bed mobility: Modified Independent             General bed  mobility comments: Increased time    Transfers Overall transfer level: Needs assistance Equipment used: 1 person hand held assist Transfers: Sit to/from Stand Sit to Stand: Min guard           General transfer comment: Min guard for blance      Balance Overall balance assessment: Needs assistance Sitting-balance support: No upper extremity supported, Feet supported Sitting balance-Leahy Scale: Good     Standing balance support: Single extremity supported Standing balance-Leahy Scale: Fair Standing balance comment: Requiring min guard for safety, reaching for furniture to steady herself                           ADL either performed or assessed with clinical judgement   ADL Overall ADL's : Needs assistance/impaired                                       General ADL Comments: min guard overall due to balance concerns     Vision Baseline Vision/History: 1 Wears glasses Ability to See in Adequate Light: 0 Adequate Patient Visual Report: No change from baseline Vision Assessment?: No apparent visual deficits Additional Comments: Tested tracking, acquity, and field cuts, no apparent deficits.     Perception     Praxis      Pertinent Vitals/Pain Pain Assessment Pain Assessment: 0-10 Pain Score: 2  Pain Location: head Pain Descriptors / Indicators: Discomfort, Headache Pain Intervention(s): Limited activity within patient's tolerance, Monitored during session, Repositioned     Hand Dominance Right  Extremity/Trunk Assessment Upper Extremity Assessment Upper Extremity Assessment: Defer to OT evaluation   Lower Extremity Assessment Lower Extremity Assessment: Overall WFL for tasks assessed   Cervical / Trunk Assessment Cervical / Trunk Assessment: Normal   Communication Communication Communication: No difficulties   Cognition Arousal/Alertness: Awake/alert Behavior During Therapy: Flat affect Overall Cognitive Status: Within  Functional Limits for tasks assessed                                       General Comments  VSS on RA, husband present and supportive    Exercises     Shoulder Instructions      Home Living Family/patient expects to be discharged to:: Private residence Living Arrangements: Spouse/significant other Available Help at Discharge: Family;Available 24 hours/day Type of Home: House Home Access: Stairs to enter [employer] of Steps: 2-3 steps Entrance Stairs-Rails: Can reach both Home Layout: Two level;Able to live on main level with bedroom/bathroom Alternate Level Stairs-Number of Steps: full flight   Bathroom Shower/Tub: Occupational psychologist: Standard     Home Equipment: Shower seat - built in;Grab bars - tub/shower;Rolling Environmental consultant (2 wheels);Cane - single point          Prior Functioning/Environment Prior Level of Function : Independent/Modified Independent;Driving;History of Falls (last six months)             Mobility Comments: Reports one recent fall outdoors          OT Problem List: Decreased strength;Decreased activity tolerance;Impaired balance (sitting and/or standing);Decreased safety awareness      OT Treatment/Interventions:      OT Goals(Current goals can be found in the care plan section) Acute Rehab OT Goals Patient Stated Goal: To go home OT Goal Formulation: With patient Time For Goal Achievement: 10/07/22 Potential to Achieve Goals: Good  OT Frequency:      Co-evaluation              AM-PAC OT "6 Clicks" Daily Activity     Outcome Measure Help from another person eating meals?: A Little Help from another person taking care of personal grooming?: A Little Help from another person toileting, which includes using toliet, bedpan, or urinal?: A Little Help from another person bathing (including washing, rinsing, drying)?: A Little Help from another person to put on and taking off regular upper body  clothing?: A Little Help from another person to put on and taking off regular lower body clothing?: A Little 6 Click Score: 18   End of Session Equipment Utilized During Treatment: Gait belt Nurse Communication: Mobility status  Activity Tolerance: Patient tolerated treatment well Patient left: in bed;with call bell/phone within reach  OT Visit Diagnosis: Unsteadiness on feet (R26.81);Other abnormalities of gait and mobility (R26.89);Muscle weakness (generalized) (M62.81)                Time: 7253-6644 OT Time Calculation (min): 13 min Charges:  OT General Charges $OT Visit: 1 Visit OT Evaluation $OT Eval Low Complexity: [place], OTR/L [place] 09/23/2022, 2:19 PM

## 2022-09-23 NOTE — Consult Note (Addendum)
Stroke Neurology Consultation Note  Consult Requested by: Dr. Doristine Bosworth  Reason for Consult: stroke on MRI  Consult Date: 09/23/22   The history was obtained from the pt.  During history and examination, all items were able to obtain unless otherwise noted.  History of Present Illness:  Robin Robles is a 66 y.o. Caucasian female with PMH of hypertension, chronic migraine, depression admitted for positive for stroke found on MRI as outpatient.  Per patient, she had chronic migraine since 66 years old.  4-5 years ago she was started on Topamax.  However she noticed intermittent confusion, word finding difficulty at work.  She follows with [facility] and eventually was told to be due to Topamax.  Her Topamax was discontinued in 01/2022.  However, since then she had a frequent migraine episode, 5 days/week of the headache, more at right frontal area, most time sharp pain but sometimes with dull pain, associate with bilateral hand and the feet tingling/numbness.  She has tried many medications including amovig and Maxalt but not effective.  Eventually an MRI with and without contrast was ordered to rule out other etiology for headache.  MRI was done on 09/20/2022 and reported tiny acute infarct in the posterior right temporal lobe and possibly another tiny acute/subacute infarct in the right frontal lobe.  She was recommended to come to hospital for further evaluation.  Currently she still has right frontal headache, dull pain, denies any other associated neurological symptoms.  She was tired with headache and feels depressed on difficult headache control.  CTA head and neck done without significant finding.  LDL was high and she was not on any cholesterol medication PTA.  Pt admitted sometimes irregular heart beat with heart racing, but no formal evaluation or testing.    Past Medical History:  Diagnosis Date   Fibromyalgia    Hypertension    Migraines     Past Surgical History:   Procedure Laterality Date   ABDOMINAL HYSTERECTOMY     CHOLECYSTECTOMY     THYROID SURGERY      Family History  Problem Relation Age of Onset   Heart failure Mother    Heart failure Father     Social History:  reports that she has never smoked. She has never used smokeless tobacco. She reports that she does not drink alcohol and does not use drugs.  Allergies:  Allergies  Allergen Reactions   Bee Venom Anaphylaxis    Throat swelling  Throat swelling    Codeine Other (See Comments)    hallucinations Other reaction(s): Delusions (intolerance), Hallucinations   Oxycodone-Acetaminophen Anxiety    Other reaction(s): Other, Other (See Comments) hyper hyper     No current facility-administered medications on file prior to encounter.   Current Outpatient Medications on File Prior to Encounter  Medication Sig Dispense Refill   buPROPion (WELLBUTRIN XL) 150 MG 24 hr tablet Take 150 mg by mouth daily.     dicyclomine (BENTYL) 20 MG tablet Take 1 tablet (20 mg total) by mouth 2 (two) times daily. (Patient taking differently: Take 20 mg by mouth daily as needed for spasms.) 20 tablet 0   omeprazole (PRILOSEC) 20 MG capsule Take 20 mg by mouth daily as needed (for acid reflux).     pregabalin (LYRICA) 75 MG capsule Take 150 mg by mouth at bedtime.     propranolol (INDERAL) 40 MG tablet Take 40 mg by mouth every evening.     sertraline (ZOLOFT) 100 MG tablet Take 200 mg by  mouth every morning.      venlafaxine XR (EFFEXOR-XR) 37.5 MG 24 hr capsule Take 37.5 mg by mouth at bedtime. for migraines     EPINEPHrine 0.3 mg/0.3 mL IJ SOAJ injection Inject into the muscle. (Patient not taking: Reported on 09/22/2022)      Review of Systems: A full ROS was attempted today and was able to be performed.  Systems assessed include - Constitutional, Eyes, HENT, Respiratory, Cardiovascular, Gastrointestinal, Genitourinary, Integument/breast, Hematologic/lymphatic, Musculoskeletal, Neurological,  Behavioral/Psych, Endocrine, Allergic/Immunologic - with pertinent responses as per HPI.  Physical Examination: Temp:  [97.7 F (36.5 C)-98.6 F (37 C)] 98 F (36.7 C) (09/30 1202) Pulse Rate:  [69-93] 70 (09/30 1202) Resp:  [10-19] 14 (09/30 1202) BP: (107-143)/(66-84) 143/82 (09/30 1202) SpO2:  [91 %-97 %] 94 % (09/30 1202) Weight:  [77 kg] 77 kg (09/30 0245)  General - well nourished, well developed, in mild distress due to HA, depressed mood.    Ophthalmologic - fundi not visualized due to noncooperation.    Cardiovascular - regular rhythm and rate  Mental Status -  Level of arousal and orientation to time, place, and person were intact. Language including expression, naming, repetition, comprehension was assessed and found intact. Fund of Knowledge was assessed and was intact.  Cranial Nerves II - XII - II - Vision intact OU. III, IV, VI - Extraocular movements intact. V - Facial sensation intact bilaterally. VII - Facial movement intact bilaterally. VIII - Hearing & vestibular intact bilaterally. X - Palate elevates symmetrically. XI - Chin turning & shoulder shrug intact bilaterally. XII - Tongue protrusion intact.  Motor Strength - The patient's strength was normal in all extremities and pronator drift was absent.   Motor Tone & Bulk - Muscle tone was assessed at the neck and appendages and was normal.  Bulk was normal and fasciculations were absent.   Reflexes - The patient's reflexes were normal in all extremities and she had no pathological reflexes.  Sensory - Light touch, temperature/pinprick were assessed and were normal.    Coordination - The patient had normal movements in the hands and feet with no ataxia or dysmetria.  Tremor was absent.  Gait and Station - deferred  Data Reviewed: ECHOCARDIOGRAM COMPLETE  Result Date: 09/23/2022    ECHOCARDIOGRAM REPORT   Patient Name:   Robin Robles Date of Exam: 09/23/2022 Medical Rec #:  [id number]       Height:        62.0 in Accession #:    [id number]      Weight:       169.8 lb Date of Birth:  11/08/1956       BSA:          1.783 m Patient Age:    34 years        BP:           114/68 mmHg Patient Gender: F               HR:           72 bpm. Exam Location:  Inpatient Procedure: 2D Echo, Cardiac Doppler and Color Doppler Indications:    Stroke I63.9  History:        Patient has no prior history of Echocardiogram examinations.                 Stroke; Risk Factors:Hypertension.  Sonographer:    Ronny Flurry Referring Phys: [id number] [place]  1. Left ventricular ejection fraction, by estimation,  is 65 to 70%. The left ventricle has normal function. The left ventricle has no regional wall motion abnormalities. Left ventricular diastolic parameters are consistent with Grade I diastolic dysfunction (impaired relaxation).  2. Right ventricular systolic function was not well visualized. The right ventricular size is not well visualized.  3. The mitral valve is grossly normal. Trivial mitral valve regurgitation.  4. The aortic valve is tricuspid. Aortic valve regurgitation is not visualized.  5. The inferior vena cava is normal in size with greater than 50% respiratory variability, suggesting right atrial pressure of 3 mmHg. Comparison(s): No prior Echocardiogram. FINDINGS  Left Ventricle: Left ventricular ejection fraction, by estimation, is 65 to 70%. The left ventricle has normal function. The left ventricle has no regional wall motion abnormalities. The left ventricular internal cavity size was normal in size. There is  no left ventricular hypertrophy. Left ventricular diastolic parameters are consistent with Grade I diastolic dysfunction (impaired relaxation). Indeterminate filling pressures. Right Ventricle: The right ventricular size is not well visualized. Right vetricular wall thickness was not well visualized. Right ventricular systolic function was not well visualized. Left Atrium: Left atrial  size was normal in size. Right Atrium: Right atrial size was normal in size. Pericardium: There is no evidence of pericardial effusion. Mitral Valve: The mitral valve is grossly normal. Trivial mitral valve regurgitation. Tricuspid Valve: The tricuspid valve is grossly normal. Tricuspid valve regurgitation is trivial. Aortic Valve: The aortic valve is tricuspid. Aortic valve regurgitation is not visualized. Pulmonic Valve: The pulmonic valve was normal in structure. Pulmonic valve regurgitation is not visualized. Aorta: The aortic root and ascending aorta are structurally normal, with no evidence of dilitation. Venous: The inferior vena cava is normal in size with greater than 50% respiratory variability, suggesting right atrial pressure of 3 mmHg. IAS/Shunts: No atrial level shunt detected by color flow Doppler.  LEFT VENTRICLE PLAX 2D LVIDd:         4.60 cm   Diastology LVIDs:         2.90 cm   LV e' medial:    6.64 cm/s LV PW:         1.00 cm   LV E/e' medial:  8.6 LV IVS:        1.00 cm   LV e' lateral:   6.64 cm/s LVOT diam:     2.10 cm   LV E/e' lateral: 8.6 LV SV:         72 LV SV Index:   40 LVOT Area:     3.46 cm  LEFT ATRIUM             Index        RIGHT ATRIUM           Index LA diam:        3.10 cm 1.74 cm/m   RA Area:     16.90 cm LA Vol (A2C):   36.0 ml 20.19 ml/m  RA Volume:   38.50 ml  21.59 ml/m LA Vol (A4C):   32.3 ml 18.12 ml/m LA Biplane Vol: 34.4 ml 19.29 ml/m  AORTIC VALVE LVOT Vmax:   98.60 cm/s LVOT Vmean:  63.700 cm/s LVOT VTI:    0.208 m  AORTA Ao Root diam: 3.40 cm Ao Asc diam:  3.30 cm MITRAL VALVE MV Area (PHT): 3.27 cm    SHUNTS MV Decel Time: 232 msec    Systemic VTI:  0.21 m MV E velocity: 57.20 cm/s  Systemic Diam: 2.10 cm MV A velocity:  79.80 cm/s MV E/A ratio:  0.72 Lyman Bishop MD Electronically signed by Lyman Bishop MD Signature Date/Time: 09/23/2022/12:35:39 PM    Final    CT ANGIO HEAD NECK W WO CM  Result Date: 09/23/2022 CLINICAL DATA:  Stroke, determine  embolic source EXAM: CT ANGIOGRAPHY HEAD AND NECK TECHNIQUE: Multidetector CT imaging of the head and neck was performed using the standard protocol during bolus administration of intravenous contrast. Multiplanar CT image reconstructions and MIPs were obtained to evaluate the vascular anatomy. Carotid stenosis measurements (when applicable) are obtained utilizing NASCET criteria, using the distal internal carotid diameter as the denominator. RADIATION DOSE REDUCTION: This exam was performed according to the departmental dose-optimization program which includes automated exposure control, adjustment of the mA and/or kV according to patient size and/or use of iterative reconstruction technique. CONTRAST:  75mL OMNIPAQUE IOHEXOL 350 MG/ML SOLN COMPARISON:  11/20/2016 FINDINGS: CT HEAD FINDINGS Brain: No evidence of acute infarction, hemorrhage, hydrocephalus, extra-axial collection or mass lesion/mass effect. Vascular: Negative Skull: Normal. Negative for fracture or focal lesion. Sinuses/Orbits: No acute finding. Review of the MIP images confirms the above findings CTA NECK FINDINGS Aortic arch: Atheromatous calcification.  Three vessel branching Right carotid system: Vessels are smooth and diffusely patent. No significant calcification. Left carotid system: Vessels are smooth and diffusely patent. Low-density plaque at the ICA bulb causes 40% narrowing. No ulceration. Vertebral arteries: No proximal subclavian stenosis. Left dominant vertebral artery. Both vertebral arteries are smoothly contoured and widely patent to the dura. Skeleton: No acute finding Other neck: Absent left lobe thyroid. Upper chest: Clear apical lungs Review of the MIP images confirms the above findings CTA HEAD FINDINGS Anterior circulation: Atheromatous calcification affecting the carotid siphons. No branch occlusion, beading, or aneurysm. Posterior circulation: Vertebral and basilar arteries are smoothly contoured and widely patent. No  branch occlusion, beading, or aneurysm. Venous sinuses: Unremarkable Anatomic variants: None significant Review of the MIP images confirms the above findings IMPRESSION: 1. No emergent finding. 2. 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 Electronically Signed   By: Jorje Guild M.D.   On: 09/23/2022 12:23   DG Chest 1 View  Result Date: 08/31/2022 CLINICAL DATA:  Presyncope hypotension EXAM: CHEST  1 VIEW COMPARISON:  11/20/2016 FINDINGS: The heart size and mediastinal contours are within normal limits. Aortic atherosclerosis. Possible small left effusion. Subsegmental atelectasis at the left base. The visualized skeletal structures are unremarkable. IMPRESSION: Possible small left effusion with subsegmental atelectasis at the left base. Electronically Signed   By: Donavan Foil M.D.   On: 08/31/2022 23:07    Assessment: 66 y.o. female with PMH of hypertension, chronic migraine, depression admitted for positive for stroke found on MRI as outpatient for chronic intractable migraine headache work up. MRI was done on 09/20/2022 and reported tiny acute infarct in the posterior right temporal lobe and possibly another tiny acute/subacute infarct in the right frontal lobe.  CTA head and neck done without significant finding (venous sinus filling defect likely large arachnoid granulations per radiology).  LDL 176 and A1C 5.4.   I am not able to see the MRI images she did in [place]. But given her chronic migraine, I suspect the tiny signals on MRI could be tiny embolic stroke vs. Signal changes associated with chronic migraine. However, I would like to treat her with DAPT at this time. She will follow up with [facility] neurology for further migraine management and long term cardiac monitoring to rule out afib. Will start statin for HLD management  too. Will check ESR and CRP to rule out temporal arteritis.   Stroke Risk Factors - hypertension and migraine  Plan: - ESR and CRP  stat - start ASA and plavix for 3 weeks and then ASA alone - start crestor 40  - recommend long term cardiac monitoring to rule out afib - follow up with [facility] neurology for arrangement - follow up with [facility] neurology for further migraine management - Risk factor modification - Neurology will sign off. Please call with questions.    Thank you for this consultation and allowing [place] to participate in the care of this patient.  Rosalin Hawking, MD PhD Stroke Neurology 09/23/2022 3:09 PM

## 2022-09-23 NOTE — Plan of Care (Signed)
  Problem: Education: Goal: Knowledge of General Education information will improve Description: Including pain rating scale, medication(s)/side effects and non-pharmacologic comfort measures Outcome: Adequate for Discharge   Problem: Health Behavior/Discharge Planning: Goal: Ability to manage health-related needs will improve Outcome: Adequate for Discharge   Problem: Clinical Measurements: Goal: Ability to maintain clinical measurements within normal limits will improve Outcome: Adequate for Discharge Goal: Will remain free from infection Outcome: Adequate for Discharge Goal: Diagnostic test results will improve Outcome: Adequate for Discharge Goal: Respiratory complications will improve Outcome: Adequate for Discharge Goal: Cardiovascular complication will be avoided Outcome: Adequate for Discharge   Problem: Activity: Goal: Risk for activity intolerance will decrease Outcome: Adequate for Discharge   Problem: Nutrition: Goal: Adequate nutrition will be maintained Outcome: Adequate for Discharge   Problem: Coping: Goal: Level of anxiety will decrease Outcome: Adequate for Discharge   Problem: Elimination: Goal: Will not experience complications related to bowel motility Outcome: Adequate for Discharge Goal: Will not experience complications related to urinary retention Outcome: Adequate for Discharge   Problem: Pain Managment: Goal: General experience of comfort will improve Outcome: Adequate for Discharge   Problem: Safety: Goal: Ability to remain free from injury will improve Outcome: Adequate for Discharge   Problem: Skin Integrity: Goal: Risk for impaired skin integrity will decrease Outcome: Adequate for Discharge   Problem: Education: Goal: Knowledge of disease or condition will improve Outcome: Adequate for Discharge Goal: Knowledge of secondary prevention will improve (SELECT ALL) Outcome: Adequate for Discharge Goal: Knowledge of patient specific  risk factors will improve (INDIVIDUALIZE FOR PATIENT) Outcome: Adequate for Discharge Goal: Individualized Educational Video(s) Outcome: Adequate for Discharge   

## 2022-09-23 NOTE — Progress Notes (Signed)
Coags drawn to assess for coagulopathy in the setting of hypotension in case surgical intervention was required or bleeding was present that would indicate the need for transfusion and/or correction of coagulopathy.

## 2022-09-23 NOTE — Discharge Summary (Signed)
Physician Discharge Summary  Robin Robles BTD:[id number] DOB: July 14, 1956 DOA: 09/22/2022  PCP: Eartha Inch, MD  Admit date: 09/22/2022 Discharge date: 09/23/2022    Admitted From: Home Disposition: Home  Recommendations for Outpatient Follow-up:  Follow up with PCP in 1-2 weeks Please obtain BMP/CBC in one week Follow-up with your primary neurology in 1 to 2 weeks Please follow up with your PCP on the following pending results: Unresulted Labs (From admission, onward)     Start     Ordered   09/23/22 1304  Sedimentation rate  Once,   R        09/23/22 1303   09/23/22 1304  C-reactive protein  Once,   R        09/23/22 1303   09/22/22 1727  Resp Panel by RT-PCR (Flu A&B, Covid) Anterior Nasal Swab  (Asymptomatic - Covid)  Once,   URGENT        09/22/22 1727              Home Health: None Equipment/Devices: None  Discharge Condition: Stable CODE STATUS: Full code Diet recommendation: Cardiac  Subjective: Seen and examined.  She has no complaints other than right-sided temporal headache which is also improved.  She wants to go home.  HPI: Robin Robles is a 66 y.o. female with medical history significant of hypertension, fibromyalgia, migraines, depression, GERD presented to the ED with complaints of right-sided headaches, photosensitivity, intermittent numbness/tingling of her upper lip and bilateral hands and gait instability.  She had an outpatient MRI done which was concerning for acute/subacute stroke and she was sent to the ED for further evaluation.   MRI of the brain done at [facility] on 09/20/2022 showing: "IMPRESSION:  1.  Tiny acute infarction in the posterior RIGHT temporal lobe and possibly another tiny acute/subacute infarction in the RIGHT frontal lobe.  2.  Mild leukoaraiosis, most likely due to chronic microvascular disease."   In the ED, vital signs stable.  CBC unremarkable.  No significant abnormalities on CMP.  INR normal, UDS negative, UA  without signs of infection, blood ethanol level undetectable. Patient was given Benadryl, Compazine, 1 L LR bolus, and Tylenol. Neurology consulted (Dr. Amada Jupiter) and recommended admission for stroke work-up.   Patient states she has a history of migraines and is having right-sided headaches since February which are different from her typical migraine headaches.  Headache is severe, 10 out of 10 intensity and feels like someone twisting a knife in her head.  Associated with photophobia, numbness and tingling of both of her hands and upper lip, and also gait instability.  States her PCP has prescribed multiple medications but they have not helped with her headaches, as such, she had a brain MRI done 3 days ago which showed stroke.  She denies history of prior strokes.  Patient states at present her headache has significantly improved after she received medications in the ED.  Brief/Interim Summary: Patient was admitted with acute/subacute ischemic CVA.  MRI as above.  Patient did not have any focal deficit.  Her only complaint was right temporal headache.  Upon presentation her headache was 6 out of 10 but today it is 1 out of 10.  She has no visual problems, temporal tenderness, dysarthria or dysphagia or any focal deficit on examination.  She was seen by neurology.  They are not sure if this is really a stroke versus just incidental findings due to migraine.  However, they are recommending DAPT for 3 weeks and then  aspirin alone.  Also she had hyperlipidemia so they recommended Crestor 40 mg p.o. daily.  She is cleared for discharge from neurology.  Seen by PT OT.  OT did not recommend anything, PT recommends outpatient PT referral.  Patient agreeable with all of that.  She has been discharged in stable condition.  Discharge plan was discussed with patient and/or family member and they verbalized understanding and agreed with it.  Discharge Diagnoses:  Principal Problem:   Acute CVA (cerebrovascular  accident) [facility]) Active Problems:   HTN (hypertension)   Depression    Discharge Instructions  Discharge Instructions     Ambulatory referral to Physical Therapy   Complete by: As directed       Allergies as of 09/23/2022       Reactions   Bee Venom Anaphylaxis   Throat swelling  Throat swelling    Codeine Other (See Comments)   hallucinations Other reaction(s): Delusions (intolerance), Hallucinations   Oxycodone-acetaminophen Anxiety   Other reaction(s): Other, Other (See Comments) hyper hyper        Medication List     TAKE these medications    aspirin EC 81 MG tablet Take 1 tablet (81 mg total) by mouth daily. Swallow whole.   buPROPion 150 MG 24 hr tablet Commonly known as: WELLBUTRIN XL Take 150 mg by mouth daily.   clopidogrel 75 MG tablet Commonly known as: Plavix Take 1 tablet (75 mg total) by mouth daily for 21 days.   dicyclomine 20 MG tablet Commonly known as: BENTYL Take 1 tablet (20 mg total) by mouth 2 (two) times daily. What changed:  when to take this reasons to take this   EPINEPHrine 0.3 mg/0.3 mL Soaj injection Commonly known as: EPI-PEN Inject into the muscle.   omeprazole 20 MG capsule Commonly known as: PRILOSEC Take 20 mg by mouth daily as needed (for acid reflux).   pregabalin 75 MG capsule Commonly known as: LYRICA Take 150 mg by mouth at bedtime.   propranolol 40 MG tablet Commonly known as: INDERAL Take 40 mg by mouth every evening.   rosuvastatin 40 MG tablet Commonly known as: CRESTOR Take 1 tablet (40 mg total) by mouth daily.   sertraline 100 MG tablet Commonly known as: ZOLOFT Take 200 mg by mouth every morning.   venlafaxine XR 37.5 MG 24 hr capsule Commonly known as: EFFEXOR-XR Take 37.5 mg by mouth at bedtime. for migraines        Follow-up Information     Chesley Noon, MD Follow up in 1 week(s).   Specialty: Family Medicine Contact information: [place]  [address] [phone]                Allergies  Allergen Reactions   Bee Venom Anaphylaxis    Throat swelling  Throat swelling    Codeine Other (See Comments)    hallucinations Other reaction(s): Delusions (intolerance), Hallucinations   Oxycodone-Acetaminophen Anxiety    Other reaction(s): Other, Other (See Comments) hyper hyper     Consultations: Neurology   Procedures/Studies: ECHOCARDIOGRAM COMPLETE  Result Date: 09/23/2022    ECHOCARDIOGRAM REPORT   Patient Name:   Robin Robles Date of Exam: 09/23/2022 Medical Rec #:  [id number]       Height:       62.0 in Accession #:    [id number]      Weight:       169.8 lb Date of Birth:  1956/01/29       BSA:  1.783 m Patient Age:    [age] years        BP:           114/68 mmHg Patient Gender: F               HR:           72 bpm. Exam Location:  Inpatient Procedure: 2D Echo, Cardiac Doppler and Color Doppler Indications:    Stroke I63.9  History:        Patient has no prior history of Echocardiogram examinations.                 Stroke; Risk Factors:Hypertension.  Sonographer:    Lucendia HerrlichShanika Turnbull Referring Phys: [id number] VASUNDHRA RATHORE IMPRESSIONS  1. Left ventricular ejection fraction, by estimation, is 65 to 70%. The left ventricle has normal function. The left ventricle has no regional wall motion abnormalities. Left ventricular diastolic parameters are consistent with Grade I diastolic dysfunction (impaired relaxation).  2. Right ventricular systolic function was not well visualized. The right ventricular size is not well visualized.  3. The mitral valve is grossly normal. Trivial mitral valve regurgitation.  4. The aortic valve is tricuspid. Aortic valve regurgitation is not visualized.  5. The inferior vena cava is normal in size with greater than 50% respiratory variability, suggesting right atrial pressure of 3 mmHg. Comparison(s): No prior Echocardiogram. FINDINGS  Left Ventricle: Left ventricular ejection fraction, by  estimation, is 65 to 70%. The left ventricle has normal function. The left ventricle has no regional wall motion abnormalities. The left ventricular internal cavity size was normal in size. There is  no left ventricular hypertrophy. Left ventricular diastolic parameters are consistent with Grade I diastolic dysfunction (impaired relaxation). Indeterminate filling pressures. Right Ventricle: The right ventricular size is not well visualized. Right vetricular wall thickness was not well visualized. Right ventricular systolic function was not well visualized. Left Atrium: Left atrial size was normal in size. Right Atrium: Right atrial size was normal in size. Pericardium: There is no evidence of pericardial effusion. Mitral Valve: The mitral valve is grossly normal. Trivial mitral valve regurgitation. Tricuspid Valve: The tricuspid valve is grossly normal. Tricuspid valve regurgitation is trivial. Aortic Valve: The aortic valve is tricuspid. Aortic valve regurgitation is not visualized. Pulmonic Valve: The pulmonic valve was normal in structure. Pulmonic valve regurgitation is not visualized. Aorta: The aortic root and ascending aorta are structurally normal, with no evidence of dilitation. Venous: The inferior vena cava is normal in size with greater than 50% respiratory variability, suggesting right atrial pressure of 3 mmHg. IAS/Shunts: No atrial level shunt detected by color flow Doppler.  LEFT VENTRICLE PLAX 2D LVIDd:         4.60 cm   Diastology LVIDs:         2.90 cm   LV e' medial:    6.64 cm/s LV PW:         1.00 cm   LV E/e' medial:  8.6 LV IVS:        1.00 cm   LV e' lateral:   6.64 cm/s LVOT diam:     2.10 cm   LV E/e' lateral: 8.6 LV SV:         72 LV SV Index:   40 LVOT Area:     3.46 cm  LEFT ATRIUM             Index        RIGHT ATRIUM  Index LA diam:        3.10 cm 1.74 cm/m   RA Area:     16.90 cm LA Vol (A2C):   36.0 ml 20.19 ml/m  RA Volume:   38.50 ml  21.59 ml/m LA Vol (A4C):   32.3  ml 18.12 ml/m LA Biplane Vol: 34.4 ml 19.29 ml/m  AORTIC VALVE LVOT Vmax:   98.60 cm/s LVOT Vmean:  63.700 cm/s LVOT VTI:    0.208 m  AORTA Ao Root diam: 3.40 cm Ao Asc diam:  3.30 cm MITRAL VALVE MV Area (PHT): 3.27 cm    SHUNTS MV Decel Time: 232 msec    Systemic VTI:  0.21 m MV E velocity: 57.20 cm/s  Systemic Diam: 2.10 cm MV A velocity: 79.80 cm/s MV E/A ratio:  0.72 Zoila Shutter MD Electronically signed by Zoila Shutter MD Signature Date/Time: 09/23/2022/12:35:39 PM    Final    CT ANGIO HEAD NECK W WO CM  Result Date: 09/23/2022 CLINICAL DATA:  Stroke, determine embolic source EXAM: CT ANGIOGRAPHY HEAD AND NECK TECHNIQUE: Multidetector CT imaging of the head and neck was performed using the standard protocol during bolus administration of intravenous contrast. Multiplanar CT image reconstructions and MIPs were obtained to evaluate the vascular anatomy. Carotid stenosis measurements (when applicable) are obtained utilizing NASCET criteria, using the distal internal carotid diameter as the denominator. RADIATION DOSE REDUCTION: This exam was performed according to the departmental dose-optimization program which includes automated exposure control, adjustment of the mA and/or kV according to patient size and/or use of iterative reconstruction technique. CONTRAST:  49mL OMNIPAQUE IOHEXOL 350 MG/ML SOLN COMPARISON:  11/20/2016 FINDINGS: CT HEAD FINDINGS Brain: No evidence of acute infarction, hemorrhage, hydrocephalus, extra-axial collection or mass lesion/mass effect. Vascular: Negative Skull: Normal. Negative for fracture or focal lesion. Sinuses/Orbits: No acute finding. Review of the MIP images confirms the above findings CTA NECK FINDINGS Aortic arch: Atheromatous calcification.  Three vessel branching Right carotid system: Vessels are smooth and diffusely patent. No significant calcification. Left carotid system: Vessels are smooth and diffusely patent. Low-density plaque at the ICA bulb causes 40%  narrowing. No ulceration. Vertebral arteries: No proximal subclavian stenosis. Left dominant vertebral artery. Both vertebral arteries are smoothly contoured and widely patent to the dura. Skeleton: No acute finding Other neck: Absent left lobe thyroid. Upper chest: Clear apical lungs Review of the MIP images confirms the above findings CTA HEAD FINDINGS Anterior circulation: Atheromatous calcification affecting the carotid siphons. No branch occlusion, beading, or aneurysm. Posterior circulation: Vertebral and basilar arteries are smoothly contoured and widely patent. No branch occlusion, beading, or aneurysm. Venous sinuses: Unremarkable Anatomic variants: None significant Review of the MIP images confirms the above findings IMPRESSION: 1. No emergent finding. 2. 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 Electronically Signed   By: Tiburcio Pea M.D.   On: 09/23/2022 12:23   DG Chest 1 View  Result Date: 08/31/2022 CLINICAL DATA:  Presyncope hypotension EXAM: CHEST  1 VIEW COMPARISON:  11/20/2016 FINDINGS: The heart size and mediastinal contours are within normal limits. Aortic atherosclerosis. Possible small left effusion. Subsegmental atelectasis at the left base. The visualized skeletal structures are unremarkable. IMPRESSION: Possible small left effusion with subsegmental atelectasis at the left base. Electronically Signed   By: Jasmine Pang M.D.   On: 08/31/2022 23:07     Discharge Exam: Vitals:   09/23/22 0451 09/23/22 1202  BP: 114/68 (!) 143/82  Pulse: 80 70  Resp: 10 14  Temp: 98 F (  36.7 C) 98 F (36.7 C)  SpO2:  94%   Vitals:   09/23/22 0214 09/23/22 0245 09/23/22 0451 09/23/22 1202  BP:  121/66 114/68 (!) 143/82  Pulse:  93 80 70  Resp:  16 10 14   Temp: 98.6 F (37 C) 97.7 F (36.5 C) 98 F (36.7 C) 98 F (36.7 C)  TempSrc: Oral Oral Axillary Oral  SpO2:    94%  Weight:  77 kg    Height:  5\' 2"  (1.575 m)      General: Pt is alert,  awake, not in acute distress Cardiovascular: RRR, S1/S2 +, no rubs, no gallops Respiratory: CTA bilaterally, no wheezing, no rhonchi Abdominal: Soft, NT, ND, bowel sounds + Extremities: no edema, no cyanosis    The results of significant diagnostics from this hospitalization (including imaging, microbiology, ancillary and laboratory) are listed below for reference.     Microbiology: No results found for this or any previous visit (from the past 240 hour(s)).   Labs: BNP (last 3 results) No results for input(s): "BNP" in the last 8760 hours. Basic Metabolic Panel: Recent Labs  Lab 09/22/22 1727 09/22/22 1801  NA 138 138  K 4.2 4.1  CL 106 105  CO2 23  --   GLUCOSE 103* 98  BUN 20 23  CREATININE 0.77 0.70  CALCIUM 9.0  --    Liver Function Tests: Recent Labs  Lab 09/22/22 1727  AST 23  ALT 19  ALKPHOS 85  BILITOT 0.4  PROT 6.4*  ALBUMIN 3.8   No results for input(s): "LIPASE", "AMYLASE" in the last 168 hours. No results for input(s): "AMMONIA" in the last 168 hours. CBC: Recent Labs  Lab 09/22/22 1727 09/22/22 1801  WBC 7.4  --   NEUTROABS 4.4  --   HGB 14.3 15.3*  HCT 43.0 45.0  MCV 90.1  --   PLT 225  --    Cardiac Enzymes: No results for input(s): "CKTOTAL", "CKMB", "CKMBINDEX", "TROPONINI" in the last 168 hours. BNP: Invalid input(s): "POCBNP" CBG: No results for input(s): "GLUCAP" in the last 168 hours. D-Dimer No results for input(s): "DDIMER" in the last 72 hours. Hgb A1c Recent Labs    09/23/22 0630  HGBA1C 5.4   Lipid Profile Recent Labs    09/23/22 0630  CHOL 247*  HDL 42  LDLCALC 176*  TRIG 145  CHOLHDL 5.9   Thyroid function studies No results for input(s): "TSH", "T4TOTAL", "T3FREE", "THYROIDAB" in the last 72 hours.  Invalid input(s): "FREET3" Anemia work up No results for input(s): "VITAMINB12", "FOLATE", "FERRITIN", "TIBC", "IRON", "RETICCTPCT" in the last 72 hours. Urinalysis    Component Value Date/Time    COLORURINE YELLOW 09/22/2022 1727   APPEARANCEUR CLEAR 09/22/2022 1727   LABSPEC 1.011 09/22/2022 1727   PHURINE 5.0 09/22/2022 1727   GLUCOSEU NEGATIVE 09/22/2022 1727   HGBUR NEGATIVE 09/22/2022 1727   BILIRUBINUR NEGATIVE 09/22/2022 1727   KETONESUR NEGATIVE 09/22/2022 1727   PROTEINUR NEGATIVE 09/22/2022 1727   UROBILINOGEN 0.2 05/10/2020 1525   NITRITE NEGATIVE 09/22/2022 1727   LEUKOCYTESUR NEGATIVE 09/22/2022 1727   Sepsis Labs Recent Labs  Lab 09/22/22 1727  WBC 7.4   Microbiology No results found for this or any previous visit (from the past 240 hour(s)).   Time coordinating discharge: Over 30 minutes  SIGNED:   09/24/2022, MD  [facility] 09/23/2022, 2:09 PM *Please note that this is a verbal dictation therefore any spelling or grammatical errors are due to the "[employer]" system  interpretation. If 7PM-7AM, please contact night-coverage [email or web address]

## 2022-09-27 ENCOUNTER — Encounter: Payer: Self-pay | Admitting: Neurology

## 2022-09-27 ENCOUNTER — Ambulatory Visit: Payer: Medicare Other | Admitting: Neurology

## 2022-09-27 VITALS — BP 121/77 | HR 71 | Ht 62.25 in | Wt 168.6 lb

## 2022-09-27 DIAGNOSIS — R519 Headache, unspecified: Secondary | ICD-10-CM | POA: Diagnosis not present

## 2022-09-27 DIAGNOSIS — G4719 Other hypersomnia: Secondary | ICD-10-CM

## 2022-09-27 DIAGNOSIS — I639 Cerebral infarction, unspecified: Secondary | ICD-10-CM | POA: Diagnosis not present

## 2022-09-27 NOTE — Progress Notes (Signed)
[facility]    Provider:  Dr Jaynee Robles Requesting Provider: Rosalin Hawking, MD Primary Care Provider:  Chesley Noon, MD  CC:  Hospital follow up    HPI:  Robin Robles is a 66 y.o. female here as requested by Robin Hawking, MD for hospital follow up.  She has a past medical history of hypertension, chronic migraine, depression admitted to for positive stroke in September of this year.  I reviewed notes from Robin Robles inpatient, chronic migraines since 66 years old, started on Topamax in the past, she noticed intermittent confusion word finding difficulty at work, she follows with [name] [facility] and eventually was told to stop Topamax, she has tried many medications including a.m. of a which have not been effective, eventually an MRI with and without contrast on September 20, 2022 reported tiny acute infarct in the posterior right temporal lobe and possibly another tiny acute/subacute infarct in the right frontal lobe and she was sent to the hospital.  LDL was high and she was not on any anticholesterol medication CTA of the head neck was found without significant finding.   She was admitted in September with a headache. She is here with her husband who provides information. Sometimes she feels like her heart stopped, she saw Robin Robles and she has an extra beat and it has to catch up. She has had some hypotension but this would not account the 2 punctate strokes (that would be more watershed). She is on DUAP right now, was not on aspirin before. Currently on Plavix and ASA and when 3 weeks is over ASA 81mg  indefinitely. LDL 176 need to get it down to < 70, placed on lipitor and follow with primary care for adjustments. She has had a sleep test 15-16 years ago. She snores heavily per husband who provides much information, she is tried all the time, no energy, fatigued, wakes up with morning headaches; recommend a sleep study.No other focal neurologic deficits, associated symptoms,  inciting events or modifiable factors.  Reviewed notes, labs and imaging from outside physicians, which showed:  MRI 09/20/2022: IMPRESSION:  [facility] (asked for them to load onto canopy for me) 1.  Tiny acute infarction in the posterior RIGHT temporal lobe and possibly another tiny acute/subacute infarction in the RIGHT frontal lobe.  2.  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    09/23/2022: CTA H&N: FINDINGS: CT HEAD FINDINGS   Brain: No evidence of acute infarction, hemorrhage, hydrocephalus, extra-axial collection or mass lesion/mass effect.   Vascular: Negative   Skull: Normal. Negative for fracture or focal lesion.   Sinuses/Orbits: No acute finding.   Review of the MIP images confirms the above findings   CTA NECK FINDINGS   Aortic arch: Atheromatous calcification.  Three vessel branching   Right carotid system: Vessels are smooth and diffusely patent. No significant calcification.   Left carotid system: Vessels are smooth and diffusely patent. Low-density plaque at the ICA bulb causes 40% narrowing. No ulceration.   Vertebral arteries: No proximal subclavian stenosis. Left dominant vertebral artery. Both vertebral arteries are smoothly contoured and widely patent to the dura.   Skeleton: No acute finding   Other neck: Absent left lobe thyroid.   Upper chest: Clear apical lungs   Review of the MIP images confirms the above findings   CTA HEAD FINDINGS   Anterior circulation: Atheromatous calcification affecting the carotid siphons. No branch occlusion, beading, or aneurysm.   Posterior circulation: Vertebral and basilar arteries are smoothly  contoured and widely patent. No branch occlusion, beading, or aneurysm.   Venous sinuses: Unremarkable   Anatomic variants: None significant   Review of the MIP images confirms the above findings   IMPRESSION: 1. No emergent finding. 2. 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    Reviewed Robin Robles's notes:  Robin Robles 07/26/2022: Assessment and Plan:  1. Chronic migraine without aura without status migrainosus, not intractable (Primary) - eptinezumab-jjmr (VYEPTI) 100 mg/mL injection; Inject 1 mL (100 mg dose) into the vein every 3 (three) months., Starting Wed 07/26/2022, Print - eletriptan (RELPAX) 40 MG tablet; Take one tablet (40 mg dose) by mouth 2 (two) times a day as needed. may take second dose after 2 hours if necessary. Max 2 tablets/24 hours, Starting Thu 08/03/2022, Normal  Discontnue Aimovig (ineffective) She declines to be treated with Botox Begin Vyepti 100 mg IV every three months [facility] [place]  Discontinue Maxalt (ineffective) Discontinue baclofen (ineffective) Begin Ubrelvy 100 mg as needed Begin Tizanidine 4 mg as needed  Headache diary HPI This is a chronic problem. Episode onset: Started at age 66. The problem occurs intermittently and has been waxing and waning. The pain is located in the right unilateral and retro-orbital regions. The quality of the pain is described as band-like, throbbing, stabbing and sharp. The pain is severe. Associated symptoms include dizziness, ear pain, eye pain, numbness, phonophobia, photophobia and tinnitus. Pertinent negatives include no nausea or vomiting. The symptoms are aggravated by emotional stress and bright light. They are worse with movement.  The headaches will last four hours to several days. There is a family history of headache. There is no described aura associated with the headache.   Currently, headache pain occurs 22 days out of the month. (25 headache days per month at last OV). The headaches can last up to 3 days.  Headaches have not improved. She feels as though they may be more intense and longer in duration.   To treat the headache pain abortively, she is using Maxalt 10 mg as needed. She is using this medication about 1-2 times a week. It is not  helpful to relieve symptoms. Baclofen 10 mg has not been helpful for her.   She is injecting Aimovig 140 mg sq monthly. She denies side effects. It has not been helpful to reduce the frequency and intensity of migraine symptoms.   Current and past medications: ANALGESICS:Tylenol ANTI-MIGRAINE:Imitrex (tachycardia), Maxalt (ineffective), Ubrelvy ($$$), Nurtec ($$$), relpax HEART/BP: Inderal DECONGESTANT/ANTIHISTAMINE: ANTI-NAUSEANT: NSAIDS: MUSCLE RELAXANTS:tizanidine, baclofen (no help)  ANTI-CONVULSANTS: Lyrica, Gabapentin, Topamax (cog. SE), Trokendi XR  STEROIDS: SLEEPING PILLS/TRANQUILIZERS: ANTI-DEPRESSANTS: Zoloft, Cymbalta, wellbutrin HERBAL:  FIBROMYALGIA:  HORMONAL: OTHER:Aimovig (ineffective)  PROCEDURES FOR HEADACHES:   Review of Systems: Patient complains of symptoms per HPI as well as the following symptoms chronic migraines. Pertinent negatives and positives per HPI. All others negative.   Social History   Socioeconomic History   Marital status: Married    Spouse name: Not on file   Number of children: Not on file   Years of education: Not on file   Highest education level: Not on file  Occupational History   Not on file  Tobacco Use   Smoking status: Never   Smokeless tobacco: Never  Vaping Use   Vaping Use: Never used  Substance and Sexual Activity   Alcohol use: No   Drug use: No   Sexual activity: Not on file  Other Topics Concern   Not on file  Social History  Narrative   Not on file   Social Determinants of Health   Financial Resource Strain: Not on file  Food Insecurity: No Food Insecurity (09/23/2022)   Hunger Vital Sign    Worried About Running Out of Food in the Last Year: Never true    Ran Out of Food in the Last Year: Never true  Transportation Needs: Unknown (09/23/2022)   PRAPARE - Administrator, Civil Service (Medical): Not on file    Lack of Transportation (Non-Medical): No  Physical Activity: Not on file  Stress:  Not on file  Social Connections: Not on file  Intimate Partner Violence: Not At Risk (09/23/2022)   Humiliation, Afraid, Rape, and Kick questionnaire    Fear of Current or Ex-Partner: No    Emotionally Abused: No    Physically Abused: No    Sexually Abused: No    Family History  Problem Relation Age of Onset   Heart failure Mother    Migraines Father    Heart failure Father    Migraines Sister    Migraines Brother     Past Medical History:  Diagnosis Date   Fibromyalgia    Hypertension    Migraines     Patient Active Problem List   Diagnosis Date Noted   Acute CVA (cerebrovascular accident) (HCC) 09/23/2022   HTN (hypertension) 09/23/2022   Depression 09/23/2022   Upper airway cough syndrome 07/31/2017    Past Surgical History:  Procedure Laterality Date   ABDOMINAL HYSTERECTOMY     CHOLECYSTECTOMY     THYROID SURGERY      Current Outpatient Medications  Medication Sig Dispense Refill   aspirin EC 81 MG tablet Take 1 tablet (81 mg total) by mouth daily. Swallow whole. 150 tablet 2   buPROPion (WELLBUTRIN XL) 150 MG 24 hr tablet Take 150 mg by mouth daily.     clopidogrel (PLAVIX) 75 MG tablet Take 1 tablet (75 mg total) by mouth daily for 21 days. 21 tablet 0   EPINEPHrine 0.3 mg/0.3 mL IJ SOAJ injection Inject into the muscle.     Eptinezumab-jjmr (VYEPTI IV) Inject into the vein. One dose, 3-4 wks ago     Multiple Vitamins-Minerals (PRESERVISION AREDS PO) Take 1 tablet by mouth daily.     omeprazole (PRILOSEC) 20 MG capsule Take 20 mg by mouth daily as needed (for acid reflux).     pregabalin (LYRICA) 75 MG capsule Take 150 mg by mouth at bedtime.     propranolol (INDERAL) 40 MG tablet Take 40 mg by mouth every evening.     rosuvastatin (CRESTOR) 40 MG tablet Take 1 tablet (40 mg total) by mouth daily. 30 tablet 0   sertraline (ZOLOFT) 100 MG tablet Take 200 mg by mouth every morning.      TIZANIDINE HCL PO Take by mouth as needed.     venlafaxine XR  (EFFEXOR-XR) 37.5 MG 24 hr capsule Take 37.5 mg by mouth at bedtime. for migraines     No current facility-administered medications for this visit.    Allergies as of 09/27/2022 - Review Complete 09/27/2022  Allergen Reaction Noted   Bee venom Anaphylaxis 11/20/2016   Codeine Other (See Comments) 09/20/2013   Oxycodone-acetaminophen Anxiety 12/13/2016    Vitals: BP 121/77   Pulse 71   Ht 5' 2.25" (1.581 m)   Wt 168 lb 9.6 oz (76.5 kg)   BMI 30.59 kg/m  Last Weight:  Wt Readings from Last 1 Encounters:  09/27/22 168 lb 9.6 oz (  76.5 kg)   Last Height:   Ht Readings from Last 1 Encounters:  09/27/22 5' 2.25" (1.581 m)     Physical exam: Exam: Gen: NAD, conversant, well nourised, obese, well groomed                     CV: RRR, no MRG. No Carotid Bruits. No peripheral edema, warm, nontender Eyes: Conjunctivae clear without exudates or hemorrhage  Neuro: Detailed Neurologic Exam  Speech:    Speech is normal; fluent and spontaneous with normal comprehension.  Cognition:    The patient is oriented to person, place, and time;     recent and remote memory intact;     language fluent;     normal attention, concentration,     fund of knowledge Cranial Nerves:    The pupils are equal, round, and reactive to light. The fundi are normal and spontaneous venous pulsations are present. Visual fields are full to finger confrontation. Extraocular movements are intact. Trigeminal sensation is intact and the muscles of mastication are normal. The face is symmetric. The palate elevates in the midline. Hearing intact. Voice is normal. Shoulder shrug is normal. The tongue has normal motion without fasciculations.   Coordination:    Normal finger to nose and heel to shin. Normal rapid alternating movements.   Gait:    Heel-toe and tandem gait are normal.   Motor Observation:    No asymmetry, no atrophy, and no involuntary movements noted. Tone:    Normal muscle tone.    Posture:     Posture is normal. normal erect    Strength:    Strength is V/V in the upper and lower limbs.      Sensation: intact to LT     Reflex Exam:  DTR's:    Deep tendon reflexes in the upper and lower extremities are normal bilaterally.   Toes:    The toes are downgoing bilaterally.   Clonus:    Clonus is absent.    Assessment/Plan:  66 y.o. female here as requested by Marvel Plan, MD for hospital follow up.  She has a past medical history of hypertension, chronic migraine, depression admitted to for positive stroke in September of this year.  MRI was 09/20/2022 and reported tiny acute infarcts in the posterior right temporal lobe and possibly another tiny acute/subacute infarct in the right frontal lobe.  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  LDL was 176 and A1c was 5.4.  Suspect embolic phenomenon and will continue stroke work-up.  30-day heart monitor to look for atrial fibrillation:  - recommend long term cardiac monitoring to rule out afib - follow up with [facility] for arrangement If the 30-day heart monitor is negative then a loop recorder Sleep study: send to sleep doctor for evaluation Continue Lipitor to get LDL < 70 Manage blood pressure, no suggestion of diabetes (hgba1c 5.4): - follow up with [facility] for further migraine management Continue Aspirin and Plavix for 3 weeks then Aspirin 81mg  alone indefinitely Follow up with sleep team(will call), cardiology(will call) and follow up with Robin Robles for migraines Stop triptans and see , triptans are contraindicated after stroke Risk factor modification Neurology will sign off. Please call with questions.   Orders Placed This Encounter  Procedures   Ambulatory referral to Sleep Studies   Cardiac event monitor   No orders of the defined types were  placed in this encounter.   Cc: Marvel Plan,  MD,  Eartha Inch, MD  Naomie Dean, MD  [facility] [address] [place], [place] [address]  Phone [phone] Fax [phone]

## 2022-09-27 NOTE — Patient Instructions (Addendum)
30-day heart monitor to look for atrial fibrillation If the 30-day heart monitor is negative then a loop recorder Sleep study: send to sleep doctor for evaluation Continue Lipitor to get LDL < 70 Manage blood pressure, no suggestion of diabetes (hgba1c 5.4) Continue Aspirin and Plavix for 3 weeks then Aspirin 81mg  alone indefinitely Follow up with sleep team(will call), cardiology(will call) and ladonna cook for migraines Stop triptans and see , triptans are contraindicated after stroke  Stroke Prevention Some medical conditions and behaviors can lead to a higher chance of having a stroke. You can help prevent a stroke by eating healthy, exercising, not smoking, and managing any medical conditions you have. Stroke is a leading cause of functional impairment. Primary prevention is particularly important because a majority of strokes are first-time events. Stroke changes the lives of not only those who experience a stroke but also their family and other caregivers. How can this condition affect me? A stroke is a medical emergency and should be treated right away. A stroke can lead to brain damage and can sometimes be life-threatening. If a person gets medical treatment right away, there is a better chance of surviving and recovering from a stroke. What can increase my risk? The following medical conditions may increase your risk of a stroke: Cardiovascular disease. High blood pressure (hypertension). Diabetes. High cholesterol. Sickle cell disease. Blood clotting disorders (hypercoagulable state). Obesity. Sleep disorders (obstructive sleep apnea). Other risk factors include: Being older than age 16. Having a history of blood clots, stroke, or mini-stroke (transient ischemic attack, TIA). Genetic factors, such as race, ethnicity, or a family history of stroke. Smoking cigarettes or using other tobacco products. Taking birth control pills, especially if you also use  tobacco. Heavy use of alcohol or drugs, especially cocaine and methamphetamine. Physical inactivity. What actions can I take to prevent this? Manage your health conditions High cholesterol levels. Eating a healthy diet is important for preventing high cholesterol. If cholesterol cannot be managed through diet alone, you may need to take medicines. Take any prescribed medicines to control your cholesterol as told by your health care provider. Hypertension. To reduce your risk of stroke, try to keep your blood pressure below 130/80. Eating a healthy diet and exercising regularly are important for controlling blood pressure. If these steps are not enough to manage your blood pressure, you may need to take medicines. Take any prescribed medicines to control hypertension as told by your health care provider. Ask your health care provider if you should monitor your blood pressure at home. Have your blood pressure checked every year, even if your blood pressure is normal. Blood pressure increases with age and some medical conditions. Diabetes. Eating a healthy diet and exercising regularly are important parts of managing your blood sugar (glucose). If your blood sugar cannot be managed through diet and exercise, you may need to take medicines. Take any prescribed medicines to control your diabetes as told by your health care provider. Get evaluated for obstructive sleep apnea. Talk to your health care provider about getting a sleep evaluation if you snore a lot or have excessive sleepiness. Make sure that any other medical conditions you have, such as atrial fibrillation or atherosclerosis, are managed. Nutrition Follow instructions from your health care provider about what to eat or drink to help manage your health condition. These instructions may include: Reducing your daily calorie intake. Limiting how much salt (sodium) you use to 1,500 milligrams (mg) each day. Using only healthy fats for  cooking,  such as olive oil, canola oil, or sunflower oil. Eating healthy foods. You can do this by: Choosing foods that are high in fiber, such as whole grains, and fresh fruits and vegetables. Eating at least 5 servings of fruits and vegetables a day. Try to fill one-half of your plate with fruits and vegetables at each meal. Choosing lean protein foods, such as lean cuts of meat, poultry without skin, fish, tofu, beans, and nuts. Eating low-fat dairy products. Avoiding foods that are high in sodium. This can help lower blood pressure. Avoiding foods that have saturated fat, trans fat, and cholesterol. This can help prevent high cholesterol. Avoiding processed and prepared foods. Counting your daily carbohydrate intake.  Lifestyle If you drink alcohol: Limit how much you have to: 0-1 drink a day for women who are not pregnant. 0-2 drinks a day for men. Know how much alcohol is in your drink. In the [place] one drink equals one 12 oz bottle of beer ( ), one 5 oz glass of wine ( ), or one 1 oz glass of hard liquor (40mL). Do not use any products that contain nicotine or tobacco. These products include cigarettes, chewing tobacco, and vaping devices, such as e-cigarettes. If you need help quitting, ask your health care provider. Avoid secondhand smoke. Do not use drugs. Activity  Try to stay at a healthy weight. Get at least 30 minutes of exercise on most days, such as: Fast walking. Biking. Swimming. Medicines Take over-the-counter and prescription medicines only as told by your health care provider. Aspirin or blood thinners (antiplatelets or anticoagulants) may be recommended to reduce your risk of forming blood clots that can lead to stroke. Avoid taking birth control pills. Talk to your health care provider about the risks of taking birth control pills if: You are over 43 years old. You smoke. You get very bad headaches. You have had a blood clot. Where to find more  information American Stroke Association: www.strokeassociation.org Get help right away if: You or a loved one has any symptoms of a stroke. "BE FAST" is an easy way to remember the main warning signs of a stroke: B - Balance. Signs are dizziness, sudden trouble walking, or loss of balance. E - Eyes. Signs are trouble seeing or a sudden change in vision. F - Face. Signs are sudden weakness or numbness of the face, or the face or eyelid drooping on one side. A - Arms. Signs are weakness or numbness in an arm. This happens suddenly and usually on one side of the body. S - Speech. Signs are sudden trouble speaking, slurred speech, or trouble understanding what people say. T - Time. Time to call emergency services. Write down what time symptoms started. You or a loved one has other signs of a stroke, such as: A sudden, severe headache with no known cause. Nausea or vomiting. Seizure. These symptoms may represent a serious problem that is an emergency. Do not wait to see if the symptoms will go away. Get medical help right away. Call your local emergency services (911 in the [place] Do not drive yourself to the hospital. Summary You can help to prevent a stroke by eating healthy, exercising, not smoking, limiting alcohol intake, and managing any medical conditions you may have. Do not use any products that contain nicotine or tobacco. These include cigarettes, chewing tobacco, and vaping devices, such as e-cigarettes. If you need help quitting, ask your health care provider. Remember "BE FAST" for warning signs of a stroke. Get help right  away if you or a loved one has any of these signs. This information is not intended to replace advice given to you by your health care provider. Make sure you discuss any questions you have with your health care provider. Document Revised: 07/12/2020 Document Reviewed: 07/12/2020 Elsevier Patient Education  [place]: Rosezena Sensor, MD

## 2022-10-01 ENCOUNTER — Encounter: Payer: Self-pay | Admitting: Neurology

## 2022-10-02 ENCOUNTER — Other Ambulatory Visit: Payer: Self-pay | Admitting: Neurology

## 2022-10-02 DIAGNOSIS — I4891 Unspecified atrial fibrillation: Secondary | ICD-10-CM

## 2022-10-02 DIAGNOSIS — I639 Cerebral infarction, unspecified: Secondary | ICD-10-CM

## 2022-10-03 ENCOUNTER — Ambulatory Visit: Payer: Medicare Other | Attending: Family Medicine | Admitting: Physical Therapy

## 2022-10-03 ENCOUNTER — Encounter: Payer: Self-pay | Admitting: Physical Therapy

## 2022-10-03 VITALS — BP 124/84 | HR 69

## 2022-10-03 DIAGNOSIS — M6281 Muscle weakness (generalized): Secondary | ICD-10-CM | POA: Diagnosis present

## 2022-10-03 DIAGNOSIS — R2689 Other abnormalities of gait and mobility: Secondary | ICD-10-CM | POA: Insufficient documentation

## 2022-10-03 DIAGNOSIS — R2681 Unsteadiness on feet: Secondary | ICD-10-CM | POA: Diagnosis present

## 2022-10-03 DIAGNOSIS — I639 Cerebral infarction, unspecified: Secondary | ICD-10-CM | POA: Insufficient documentation

## 2022-10-03 NOTE — Therapy (Signed)
OUTPATIENT PHYSICAL THERAPY NEURO EVALUATION   Patient Name: Robin Robles MRN: [id number] DOB:06-16-1956, 66 y.o., female Today's Date: 10/03/2022   PCP: Chesley Noon, MD REFERRING PROVIDER: Darliss Cheney, MD   PT End of Session - 10/03/22 1534     Visit Number 1    Number of Visits 17    Date for PT Re-Evaluation 12/02/22    PT Start Time 1418   PT able to see pt early   PT Stop Time 1500    PT Time Calculation (min) 42 min    Equipment Utilized During Treatment Gait belt    Activity Tolerance Patient tolerated treatment well;Other (comment)   limited by headaches   Behavior During Therapy [facility] for tasks assessed/performed             Past Medical History:  Diagnosis Date   Fibromyalgia    Hypertension    Migraines    Past Surgical History:  Procedure Laterality Date   ABDOMINAL HYSTERECTOMY     CHOLECYSTECTOMY     THYROID SURGERY     Patient Active Problem List   Diagnosis Date Noted   Acute CVA (cerebrovascular accident) ([place]) 09/23/2022   HTN (hypertension) 09/23/2022   Depression 09/23/2022   Upper airway cough syndrome 07/31/2017    ONSET DATE: 09/23/2022  REFERRING DIAG: I63.9 (ICD-10-CM) - Acute CVA (cerebrovascular accident) ([place])  THERAPY DIAG:  Unsteadiness on feet  Other abnormalities of gait and mobility  Muscle weakness (generalized)  Rationale for Evaluation and Treatment Rehabilitation  SUBJECTIVE:                                                                                                                                                                                              SUBJECTIVE STATEMENT: Reports she feels how she felt before she was hospitalized for her CVA. Reports has had balance issues that have started gradually over the past few months (even before her CVA). Has not told her PCP/neurologist about worsening balance. Feels like she will drift off to the L when walking. Sometimes when walking will feel like  she will trip over her R foot.  Has bad migraines a few times a week, currently wearing sunglasses to help with the light sensitivity. Follows up with [facility] for migraine management. No dizziness, except when changing positions too quickly. Had one fall, but just when she fell off a curb. Wants to go to [facility] to exercise after balance gets better.   Pt accompanied by: significant other, Husband Ronalee Belts  PERTINENT HISTORY: Pt is a 67 y/o F admitted on 09/22/22 with temporal and possible frontal  lobe stroke and discharged home on 09/23/22. PMH significant for but not limited to HTN, fibromyalgia, migraines, glaucoma.   PAIN:  Are you having pain? Yes: NPRS scale: 3/10 Pain location: Frontal R headache.  Pain description: Throbbing Aggravating factors: Not sure. Relieving factors: Nothing.   Vitals:   10/03/22 1421  BP: 124/84  Pulse: 69    PRECAUTIONS: Fall  FALLS: Has patient fallen in last 6 months? Yes. Number of falls 1, going off a curb.   LIVING ENVIRONMENT: Lives with: lives with their spouse Lives in: House/apartment Stairs: Yes: Internal: 12 steps; on right going up and has landings so not as steep and External: 3 steps; on right going up Has following equipment at home: Single point cane, Grab bars, and doesn't use the cane.   PLOF: Independent and Leisure: likes Social worker, sewing.   PATIENT GOALS Wants to correct her balance so she doesn't always feel like shes going to fall.   OBJECTIVE:   DIAGNOSTIC FINDINGS: MRI of the brain done at [place] on 09/20/2022 showing: "IMPRESSION:  1.  Tiny acute infarction in the posterior RIGHT temporal lobe and possibly another tiny acute/subacute infarction in the RIGHT frontal lobe.  2.  Mild leukoaraiosis, most likely due to chronic microvascular disease."  COGNITION: Overall cognitive status: Within functional limits for tasks assessed   SENSATION: WFL in legs, reports some N/T in hands with migraines.    COORDINATION: Heel to shin: WFL bilat    POSTURE: rounded shoulders Trunk lean to L    LOWER EXTREMITY MMT:    MMT Right Eval Left Eval  Hip flexion 4-/5 4+/5  Hip extension    Hip abduction 5/5 5/5  Hip adduction 5/5 5/5  Hip internal rotation    Hip external rotation    Knee flexion 5/5 5/5  Knee extension 5/5 5/5  Ankle dorsiflexion 5/5 5/5  Ankle plantarflexion    Ankle inversion    Ankle eversion    (Blank rows = not tested)  All tested in sitting.     TRANSFERS: Assistive device utilized: None  Sit to stand: SBA Stand to sit: SBA No UE support. Performs with LLE staggered posteriorly. When initially standing will sometimes lean to the L.    GAIT: Gait pattern: step through pattern, decreased arm swing- Right, decreased arm swing- Left, decreased stride length, lateral lean- Left, and decreased trunk rotation Distance walked: Clinic distances.  Assistive device utilized: None Level of assistance: SBA Comments: Pt very guarded when she walks. Needs to hold onto the walls intermittently. Pt also needs to hold onto walls or furniture at home for balance.  Pt tends to lean to the L.   FUNCTIONAL TESTs:  5 times sit to stand: 21.31 seconds without UE support, LLE staggered posteriorly. Pt reports feeling like she could fall if se went faster.  10 meter walk test: 20.37 seconds with no AD = 1.61 ft/sec  SLS: RLE 13 seconds, LLE: 10.25 seconds    M-CTSIB  Condition 1: Firm Surface, EO 30 Sec, Normal Sway  Condition 2: Firm Surface, EC 30 Sec, Incr trunk lean to L  Condition 3: Foam Surface, EO 25 Sec, Normal Sway, trunk lean to L  Condition 4: Foam Surface, EC 5 Sec     [facility] PT Assessment - 10/03/22 1455       Functional Gait  Assessment   Gait assessed  Yes    Gait Level Surface Walks 20 ft, slow speed, abnormal gait pattern, evidence for imbalance or deviates  10-15 in outside of the 12 in walkway width. Requires more than 7 sec to ambulate 20 ft.    15.2 seconds   Change in Gait Speed Makes only minor adjustments to walking speed, or accomplishes a change in speed with significant gait deviations, deviates 10-15 in outside the 12 in walkway width, or changes speed but loses balance but is able to recover and continue walking.    Gait with Horizontal Head Turns Performs head turns smoothly with slight change in gait velocity (eg, minor disruption to smooth gait path), deviates 6-10 in outside 12 in walkway width, or uses an assistive device.    Gait with Vertical Head Turns Performs task with moderate change in gait velocity, slows down, deviates 10-15 in outside 12 in walkway width but recovers, can continue to walk.    Gait and Pivot Turn Pivot turns safely in greater than 3 sec and stops with no loss of balance, or pivot turns safely within 3 sec and stops with mild imbalance, requires small steps to catch balance.    Step Over Obstacle Is able to step over one shoe box (4.5 in total height) but must slow down and adjust steps to clear box safely. May require verbal cueing.    Gait with Narrow Base of Support Ambulates less than 4 steps heel to toe or cannot perform without assistance.    Gait with Eyes Closed Walks 20 ft, slow speed, abnormal gait pattern, evidence for imbalance, deviates 10-15 in outside 12 in walkway width. Requires more than 9 sec to ambulate 20 ft.   27 seconds   Ambulating Backwards Walks 20 ft, slow speed, abnormal gait pattern, evidence for imbalance, deviates 10-15 in outside 12 in walkway width.   30 seconds   Steps Two feet to a stair, must use rail.    Total Score 11    FGA comment: 11/30 = High Risk of Falls              TODAY'S TREATMENT:  N/A during eval.    PATIENT EDUCATION: Education details: Clinical findings, POC, education on fall risk. Discussed that since pt has had gradual worsening balance issues before her CVA and pt being at a significant risk for falls, pt needs to make her neurologist/PCP  aware of gradual change in balance so they can determine a potential cause.  Person educated: Patient and Spouse Education method: Explanation Education comprehension: verbalized understanding   HOME EXERCISE PROGRAM: Will provide at next session.     GOALS: Goals reviewed with patient? Yes  SHORT TERM GOALS: Target date: 10/31/2022  Pt will be independent with initial HEP in order to build upon functional gains made in therapy. Baseline: Goal status: INITIAL  2.  Pt will verbalize understanding of fall prevention in the home.  Baseline:  Goal status: INITIAL  3.  Pt will improve FGA to at least a 14/30 in order to demo decr fall risk. Baseline: 11/30 Goal status: INITIAL  4.  Pt will improve condition 3 of mCTSIB to at least 30 seconds in order to demo improved balance.  Baseline: 25 Sec Goal status: INITIAL  5.  Pt will improve gait speed with no AD vs. LRAD to at least 1.9 ft/sec in order to demo decr fall risk  Baseline: 20.37 seconds with no AD = 1.61 ft/sec Goal status: INITIAL    LONG TERM GOALS: Target date: 11/28/2022  Pt will be independent with final HEP in order to build upon functional gains made in  therapy. Baseline:  Goal status: INITIAL  2.  Pt will improve FGA to at least a 18/30 in order to demo decr fall risk. Baseline: 11/30 Goal status: INITIAL  3.  Pt will improve gait speed with no AD vs. LRAD to at least 2.3 ft/sec in order to demo improved community mobility.  Baseline: 20.37 seconds with no AD = 1.61 ft/sec Goal status: INITIAL  4.  Pt will improve condition 4 of mCTSIB to at least 15 seconds in order to demo improved vestibular input for balance.  Baseline: 5 Sec Goal status: INITIAL  5.  Pt will improve 5x sit<>stand to less than or equal to 16 sec to demonstrate improved functional strength and transfer efficiency.  Baseline: 21.31 seconds without UE support Goal status: INITIAL  6.  Pt will ambulate at least 500' over paved  surfaces outdoors with no AD vs. LRAD with supervision in order to demo improved community mobility.  Baseline:  Goal status: INITIAL  ASSESSMENT:  CLINICAL IMPRESSION: Patient is a 66 year old female referred to Neuro [facility] for acute CVA.  Pt was admitted on 09/22/22 with temporal and possible frontal lobe stroke and discharged home on 09/23/22. Pt reports she feels the same as she was before the CVA, but has had a gradual worsening of her balance for the past few months. Pt has not let her PCP/neurologist know about worsening balance. Discussed with pt and pt's husband to make sure they let her PCP know as her balance was worsening even before her CVA. The following deficits were present during the exam: impaired balance, decr strength, postural abnormalities, gait abnormalities. Based on gait speed, FGA, 5x sit <> stand, pt is an incr risk for falls. Pt challenged with condition 4 of mCTSIB, indicating decr vestibular input for balance. Pt would benefit from skilled PT to address these impairments and functional limitations to maximize functional mobility independence and decr fall risk.     OBJECTIVE IMPAIRMENTS Abnormal gait, decreased activity tolerance, decreased balance, decreased coordination, decreased knowledge of use of DME, difficulty walking, decreased strength, postural dysfunction, and pain.   ACTIVITY LIMITATIONS carrying, bending, standing, stairs, transfers, and locomotion level  PARTICIPATION LIMITATIONS: shopping and community activity  PERSONAL FACTORS Age, Behavior pattern, Past/current experiences, Time since onset of injury/illness/exacerbation, and 3+ comorbidities: HTN, fibromyalgia, migraines, glaucoma.   are also affecting patient's functional outcome.   REHAB POTENTIAL: Good  CLINICAL DECISION MAKING: Evolving/moderate complexity  EVALUATION COMPLEXITY: Moderate  PLAN: PT FREQUENCY: 2x/week  PT DURATION: 8 weeks  PLANNED INTERVENTIONS: Therapeutic exercises,  Therapeutic activity, Neuromuscular re-education, Balance training, Gait training, Patient/Family education, Self Care, Stair training, Vestibular training, Canalith repositioning, DME instructions, and Re-evaluation  PLAN FOR NEXT SESSION: Initial HEP for functional strengthening, balance - EC, unlevel surfaces, narrow BOS, head motions. Continue to work on balance. May need to see what AD is appropriate for pt due to fall risk. Potential further vestibular assessment?    Arliss Journey, PT, DPT  10/03/2022, 3:35 PM

## 2022-10-07 ENCOUNTER — Ambulatory Visit: Payer: Medicare Other | Attending: Neurology

## 2022-10-07 DIAGNOSIS — I4891 Unspecified atrial fibrillation: Secondary | ICD-10-CM | POA: Diagnosis not present

## 2022-10-07 DIAGNOSIS — I639 Cerebral infarction, unspecified: Secondary | ICD-10-CM | POA: Diagnosis not present

## 2022-10-09 ENCOUNTER — Ambulatory Visit: Payer: Medicare Other | Admitting: Physical Therapy

## 2022-10-09 DIAGNOSIS — R2689 Other abnormalities of gait and mobility: Secondary | ICD-10-CM

## 2022-10-09 DIAGNOSIS — R2681 Unsteadiness on feet: Secondary | ICD-10-CM | POA: Diagnosis not present

## 2022-10-09 DIAGNOSIS — M6281 Muscle weakness (generalized): Secondary | ICD-10-CM

## 2022-10-09 NOTE — Therapy (Signed)
OUTPATIENT PHYSICAL THERAPY NEURO TREATMENT   Patient Name: Robin Robles MRN: [id number] DOB:04/07/1956, 66 y.o., female Today's Date: 10/09/2022   PCP: Eartha Inch, MD REFERRING PROVIDER: Hughie Closs, MD   PT End of Session - 10/09/22 [phone]     Visit Number 2    Number of Visits 17    Date for PT Re-Evaluation 12/02/22    Authorization Type [employer]    PT Start Time 0930    PT Stop Time 1010    PT Time Calculation (min) 40 min    Equipment Utilized During Treatment Gait belt    Activity Tolerance Patient tolerated treatment well    Behavior During Therapy WFL for tasks assessed/performed              Past Medical History:  Diagnosis Date   Fibromyalgia    Hypertension    Migraines    Past Surgical History:  Procedure Laterality Date   ABDOMINAL HYSTERECTOMY     CHOLECYSTECTOMY     THYROID SURGERY     Patient Active Problem List   Diagnosis Date Noted   Acute CVA (cerebrovascular accident) ([facility]) 09/23/2022   HTN (hypertension) 09/23/2022   Depression 09/23/2022   Upper airway cough syndrome 07/31/2017    ONSET DATE: 09/23/2022  REFERRING DIAG: I63.9 (ICD-10-CM) - Acute CVA (cerebrovascular accident) ([facility])  THERAPY DIAG:  Unsteadiness on feet  Other abnormalities of gait and mobility  Muscle weakness (generalized)  Rationale for Evaluation and Treatment Rehabilitation  SUBJECTIVE:                                                                                                                                                                                              SUBJECTIVE STATEMENT: Pt reports no falls or changes since last visit. Pt reports she is still experiencing her headaches but those are chronic. Pt reports she does notice herself leaning to the L while walking so she tries to correct it by coming more to the R.  Pt accompanied by: self  PERTINENT HISTORY: Pt is a 66 y/o F admitted on 09/22/22 with temporal and possible  frontal lobe stroke and discharged home on 09/23/22. PMH significant for but not limited to HTN, fibromyalgia, migraines, glaucoma.   PAIN:  Are you having pain? Yes: NPRS scale: 3/10 Pain location: Frontal R headache.  Pain description: Throbbing Aggravating factors: Not sure. Relieving factors: Nothing.   There were no vitals filed for this visit.   PRECAUTIONS: Fall  FALLS: Has patient fallen in last 6 months? Yes. Number of falls 1, going off a curb.   LIVING ENVIRONMENT: Lives  with: lives with their spouse Lives in: House/apartment Stairs: Yes: Internal: 12 steps; on right going up and has landings so not as steep and External: 3 steps; on right going up Has following equipment at home: Single point cane, Grab bars, and doesn't use the cane.   PLOF: Independent and Leisure: likes Nutritional therapist, sewing.   PATIENT GOALS Wants to correct her balance so she doesn't always feel like shes going to fall.   OBJECTIVE:   TODAY'S TREATMENT:   GAIT: Gait pattern: decreased arm swing- Right and decreased arm swing- Left Distance walked: 115 ft Assistive device utilized: None Level of assistance: SBA Comments: path deviation especially to the L that increases with turns, hesitant with decreased speed, reaches out for furniture and walls to steady herself  Gait pattern: decreased arm swing- Left, decreased step length- Right, decreased step length- Left, and scissoring, path deviation to the L, narrow BOS Distance walked: 115 ft Assistive device utilized: Single point cane Level of assistance: CGA Comments: trial gait with SPC, increased LOB initially with use of device, reports she feels herself being "propelled" forwards  Gait pattern: decreased arm swing- Left, decreased step length- Right, decreased step length- Left, and scissoring, path deviation to the L, narrow BOS Distance walked: 115 ft Assistive device utilized: Quad cane small base Level of assistance: CGA Comments:  trial gait with SBQC/hurricane, reports increased pressure on RUE due to reliance on device, does exhibit improved balance on turns  Pt enters/exits clinic without use of AD, utilizes furniture and walls for steadying herself due to balance impairments with very slow gait speed and hesitancy with her movements.  Pt becomes emotional/tearful regarding possible use of AD, education with patient about increased independence and safety with use of AD vs not using one. Will benefit from continued education and trial of various devices (rollator, etc.).  THER ACT: Initiated HEP, see bolded below   PATIENT EDUCATION: Education details: pt needs to make her neurologist/PCP aware of gradual change in balance so they can determine a potential cause, AD use for increased safety and independence with functional mobility, initiated HEP Person educated: Patient Education method: Explanation and Handouts Education comprehension: verbalized understanding   HOME EXERCISE PROGRAM: Access Code: [email or web address] URL: [email or web address] Date: 10/09/2022 Prepared by: Peter Congo  Exercises - Side Stepping with Resistance at Ankles and Counter Support  - 1 x daily - 5 x weekly - 1 sets - 5 reps - Forward Monster Walk with Resistance at Ankles and Counter Support  - 1 x daily - 5 x weekly - 1 sets - 5 reps - Tandem Walking with Counter Support  - 1 x daily - 5 x weekly - 1 sets - 5 reps - Romberg Stance with Head Rotation  - 1 x daily - 5 x weekly - 2 sets - 15 reps   GOALS: Goals reviewed with patient? Yes  SHORT TERM GOALS: Target date: 10/31/2022  Pt will be independent with initial HEP in order to build upon functional gains made in therapy. Baseline: Goal status: INITIAL  2.  Pt will verbalize understanding of fall prevention in the home.  Baseline:  Goal status: INITIAL  3.  Pt will improve FGA to at least a 14/30 in order to demo decr fall risk. Baseline: 11/30 Goal status:  INITIAL  4.  Pt will improve condition 3 of mCTSIB to at least 30 seconds in order to demo improved balance.  Baseline: 25 Sec Goal status: INITIAL  5.  Pt will improve  gait speed with no AD vs. LRAD to at least 1.9 ft/sec in order to demo decr fall risk  Baseline: 20.37 seconds with no AD = 1.61 ft/sec Goal status: INITIAL    LONG TERM GOALS: Target date: 11/28/2022  Pt will be independent with final HEP in order to build upon functional gains made in therapy. Baseline:  Goal status: INITIAL  2.  Pt will improve FGA to at least a 18/30 in order to demo decr fall risk. Baseline: 11/30 Goal status: INITIAL  3.  Pt will improve gait speed with no AD vs. LRAD to at least 2.3 ft/sec in order to demo improved community mobility.  Baseline: 20.37 seconds with no AD = 1.61 ft/sec Goal status: INITIAL  4.  Pt will improve condition 4 of mCTSIB to at least 15 seconds in order to demo improved vestibular input for balance.  Baseline: 5 Sec Goal status: INITIAL  5.  Pt will improve 5x sit<>stand to less than or equal to 16 sec to demonstrate improved functional strength and transfer efficiency.  Baseline: 21.31 seconds without UE support Goal status: INITIAL  6.  Pt will ambulate at least 500' over paved surfaces outdoors with no AD vs. LRAD with supervision in order to demo improved community mobility.  Baseline:  Goal status: INITIAL  ASSESSMENT:  CLINICAL IMPRESSION: Emphasis of skilled PT session on trialing various AD and initiating HEP for balance and LE strengthening. Pt exhibits decreased balance and safety with use of SPC vs no AD, does exhibit improved balance with use of SBQC but also exhibits ongoing narrow BOS and scissoring of LE during gait. Pt exhibits resistance to use of an AD at this time due to fear of loss of independence, will benefit from ongoing education regarding purpose of AD use as well as continued to work towards improving static and dynamic balance to  decrease fall risk and improve gait with decreased AD reliance. Continue POC.   OBJECTIVE IMPAIRMENTS Abnormal gait, decreased activity tolerance, decreased balance, decreased coordination, decreased knowledge of use of DME, difficulty walking, decreased strength, postural dysfunction, and pain.   ACTIVITY LIMITATIONS carrying, bending, standing, stairs, transfers, and locomotion level  PARTICIPATION LIMITATIONS: shopping and community activity  PERSONAL FACTORS Age, Behavior pattern, Past/current experiences, Time since onset of injury/illness/exacerbation, and 3+ comorbidities: HTN, fibromyalgia, migraines, glaucoma.   are also affecting patient's functional outcome.   REHAB POTENTIAL: Good  CLINICAL DECISION MAKING: Evolving/moderate complexity  EVALUATION COMPLEXITY: Moderate  PLAN: PT FREQUENCY: 2x/week  PT DURATION: 8 weeks  PLANNED INTERVENTIONS: Therapeutic exercises, Therapeutic activity, Neuromuscular re-education, Balance training, Gait training, Patient/Family education, Self Care, Stair training, Vestibular training, Canalith repositioning, DME instructions, and Re-evaluation  PLAN FOR NEXT SESSION: add to HEP for functional strengthening, balance - EC, unlevel surfaces, narrow BOS, head motions. Continue to work on balance. Have trialed SPC and SBQC, trial rollator? Potential further vestibular assessment?    Excell Seltzer, PT, DPT, CSRS  10/09/2022, 10:12 AM

## 2022-10-12 ENCOUNTER — Ambulatory Visit: Payer: Medicare Other

## 2022-10-16 ENCOUNTER — Telehealth: Payer: Self-pay | Admitting: Neurology

## 2022-10-16 ENCOUNTER — Encounter: Payer: Self-pay | Admitting: Physical Therapy

## 2022-10-16 ENCOUNTER — Ambulatory Visit: Payer: Medicare Other | Admitting: Physical Therapy

## 2022-10-16 DIAGNOSIS — R2689 Other abnormalities of gait and mobility: Secondary | ICD-10-CM

## 2022-10-16 DIAGNOSIS — R2681 Unsteadiness on feet: Secondary | ICD-10-CM

## 2022-10-16 DIAGNOSIS — M6281 Muscle weakness (generalized): Secondary | ICD-10-CM

## 2022-10-16 NOTE — Telephone Encounter (Signed)
Hi Dr. Jaynee Eagles! This patient came in the office today stating she received a letter that her primary neurologist-LaDonna Lacinda Axon NP at [facility] is retiring. Is she okay to schedule with you to transfer her care over? Thank you :) Looks like chronic migraines I just wanted to make sure

## 2022-10-16 NOTE — Therapy (Signed)
OUTPATIENT PHYSICAL THERAPY NEURO TREATMENT   Patient Name: Robin Robles MRN: [id number] DOB:[birth year], 66 y.o., female Today's Date: 10/16/2022   PCP: Chesley Noon, MD REFERRING PROVIDER: Darliss Cheney, MD   PT End of Session - 10/16/22 [phone]     Visit Number 3    Number of Visits 17    Date for PT Re-Evaluation 12/02/22    Authorization Type [employer] Medicare    PT Start Time [phone]   pt needing to use restroom at start of session   PT Stop Time 1014    PT Time Calculation (min) 40 min    Equipment Utilized During Treatment Gait belt    Activity Tolerance Patient tolerated treatment well    Behavior During Therapy WFL for tasks assessed/performed              Past Medical History:  Diagnosis Date   Fibromyalgia    Hypertension    Migraines    Past Surgical History:  Procedure Laterality Date   ABDOMINAL HYSTERECTOMY     CHOLECYSTECTOMY     THYROID SURGERY     Patient Active Problem List   Diagnosis Date Noted   Acute CVA (cerebrovascular accident) ([place]) 09/23/2022   HTN (hypertension) 09/23/2022   Depression 09/23/2022   Upper airway cough syndrome 07/31/2017    ONSET DATE: 09/23/2022  REFERRING DIAG: I63.9 (ICD-10-CM) - Acute CVA (cerebrovascular accident) ([place])  THERAPY DIAG:  Unsteadiness on feet  Other abnormalities of gait and mobility  Muscle weakness (generalized)  Rationale for Evaluation and Treatment Rehabilitation  SUBJECTIVE:                                                                                                                                                                                              SUBJECTIVE STATEMENT: Reports her BOS is wider when she is ambulating. Reports the monster walk exercises have helped a lot. No falls. Feels like she is able to walk faster as well.   Pt accompanied by: self  PERTINENT HISTORY: Pt is a 66 y/o F admitted on 09/22/22 with temporal and possible frontal lobe stroke and  discharged home on 09/23/22. PMH significant for but not limited to HTN, fibromyalgia, migraines, glaucoma.   PAIN:  Are you having pain? Yes: NPRS scale: 3/10 Pain location: Frontal R headache.  Pain description: Throbbing Aggravating factors: Not sure. Relieving factors: Nothing.   There were no vitals filed for this visit.   PRECAUTIONS: Fall  FALLS: Has patient fallen in last 6 months? Yes. Number of falls 1, going off a curb.   LIVING ENVIRONMENT: Lives with: lives with their  spouse Lives in: House/apartment Stairs: Yes: Internal: 12 steps; on right going up and has landings so not as steep and External: 3 steps; on right going up Has following equipment at home: Single point cane, Grab bars, and doesn't use the cane.   PLOF: Independent and Leisure: likes Nutritional therapist, sewing.   PATIENT GOALS Wants to correct her balance so she doesn't always feel like shes going to fall.   OBJECTIVE:   TODAY'S TREATMENT:   GAIT: Gait pattern: decreased arm swing- Right and decreased arm swing- Left Distance walked: 115 ft Assistive device utilized: None Level of assistance: SBA Comments: Pt not having to reach out towards walls/furniture when ambulating in today. Pt focusing more on wider BOS.   Gait pattern: decreased arm swing- Left, decreased step length- Right, and decreased step length- Left Distance walked: 230' x 1 with SPC, 115' x 1 with SPC with 4 prong tip  Assistive device utilized: Single point cane Level of assistance: SBA Comments: Practiced again with cane today with initial reminder cues for sequencing (pt holds cane in RUE) and technique. Pt able to ambulate better the 2nd lap with proper sequencing, esp when going around curves. Also trialed x1 lap with SPC with 4 prong tip with pt reporting feeling more secure with this cane and steady. Pt with no episodes of scissoring and able to pick up technique better today than previous session. Showed pt where to purchase SPC  with 4 prong tip from [employer].    NMR:  10 reps sit <> stands on air ex with no UE support SLS: on level ground alternating SLS taps to 2 cones then cross body taps x12 reps, progressed same activity to standing on air ex x12 reps each leg, pt able to perform with no UE support  Alternating stepping over 4 smaller orange hurdles down and back x2 reps at countertop, progressing to 4 larger hurdles, down and back x3 reps with focus on slowed pace for SLS and foot clearance, pt needing intermittent UE support.  On blue mat: tandem gait forwards down and back x3 reps, retro gait and then progressing to a retro march down and back x3 reps On air ex: with feet together EO x10 reps head turns, x10 reps head nods. With feet hip width > narrow BOS EC 2 x 30 seconds    PATIENT EDUCATION: Education details: Continue HEP, information to order cane.  Person educated: Patient Education method: Medical illustrator Education comprehension: verbalized understanding   HOME EXERCISE PROGRAM: Access Code: [email or web address] URL: [email or web address] Date: 10/09/2022 Prepared by: Peter Congo  Exercises - Side Stepping with Resistance at Ankles and Counter Support  - 1 x daily - 5 x weekly - 1 sets - 5 reps - Forward Monster Walk with Resistance at Ankles and Counter Support  - 1 x daily - 5 x weekly - 1 sets - 5 reps - Tandem Walking with Counter Support  - 1 x daily - 5 x weekly - 1 sets - 5 reps - Romberg Stance with Head Rotation  - 1 x daily - 5 x weekly - 2 sets - 15 reps   GOALS: Goals reviewed with patient? Yes  SHORT TERM GOALS: Target date: 10/31/2022  Pt will be independent with initial HEP in order to build upon functional gains made in therapy. Baseline: Goal status: INITIAL  2.  Pt will verbalize understanding of fall prevention in the home.  Baseline:  Goal status: INITIAL  3.  Pt will improve FGA  to at least a 14/30 in order to demo decr fall risk. Baseline:  11/30 Goal status: INITIAL  4.  Pt will improve condition 3 of mCTSIB to at least 30 seconds in order to demo improved balance.  Baseline: 25 Sec Goal status: INITIAL  5.  Pt will improve gait speed with no AD vs. LRAD to at least 1.9 ft/sec in order to demo decr fall risk  Baseline: 20.37 seconds with no AD = 1.61 ft/sec Goal status: INITIAL    LONG TERM GOALS: Target date: 11/28/2022  Pt will be independent with final HEP in order to build upon functional gains made in therapy. Baseline:  Goal status: INITIAL  2.  Pt will improve FGA to at least a 18/30 in order to demo decr fall risk. Baseline: 11/30 Goal status: INITIAL  3.  Pt will improve gait speed with no AD vs. LRAD to at least 2.3 ft/sec in order to demo improved community mobility.  Baseline: 20.37 seconds with no AD = 1.61 ft/sec Goal status: INITIAL  4.  Pt will improve condition 4 of mCTSIB to at least 15 seconds in order to demo improved vestibular input for balance.  Baseline: 5 Sec Goal status: INITIAL  5.  Pt will improve 5x sit<>stand to less than or equal to 16 sec to demonstrate improved functional strength and transfer efficiency.  Baseline: 21.31 seconds without UE support Goal status: INITIAL  6.  Pt will ambulate at least 500' over paved surfaces outdoors with no AD vs. LRAD with supervision in order to demo improved community mobility.  Baseline:  Goal status: INITIAL  ASSESSMENT:  CLINICAL IMPRESSION: Today's skilled session continued to focus on trialing different AD and working on balance strategies with decr UE support. Pt with improved technique/balance with use of SPC today and had no episodes of scissoring compared to previous session. Pt preferred to use the [facility] with 4 prong tip for improved stability. Pt able to perform with supervision and showed pt how to purchase. Remainder of session focused on SLS tasks and balance on compliant surfaces. Pt able to tolerate well and able to perform  without UE support. Will continue per POC.    OBJECTIVE IMPAIRMENTS Abnormal gait, decreased activity tolerance, decreased balance, decreased coordination, decreased knowledge of use of DME, difficulty walking, decreased strength, postural dysfunction, and pain.   ACTIVITY LIMITATIONS carrying, bending, standing, stairs, transfers, and locomotion level  PARTICIPATION LIMITATIONS: shopping and community activity  PERSONAL FACTORS Age, Behavior pattern, Past/current experiences, Time since onset of injury/illness/exacerbation, and 3+ comorbidities: HTN, fibromyalgia, migraines, glaucoma.   are also affecting patient's functional outcome.   REHAB POTENTIAL: Good  CLINICAL DECISION MAKING: Evolving/moderate complexity  EVALUATION COMPLEXITY: Moderate  PLAN: PT FREQUENCY: 2x/week  PT DURATION: 8 weeks  PLANNED INTERVENTIONS: Therapeutic exercises, Therapeutic activity, Neuromuscular re-education, Balance training, Gait training, Patient/Family education, Self Care, Stair training, Vestibular training, Canalith repositioning, DME instructions, and Re-evaluation  PLAN FOR NEXT SESSION: functional strengthening, balance - EC, unlevel surfaces, narrow BOS, head motions. Continue to work on balance. Did pt get cane? Add to HEP as needed.    Drake Leach, PT, DPT 10/16/2022, 10:15 AM

## 2022-10-19 ENCOUNTER — Encounter: Payer: Self-pay | Admitting: Physical Therapy

## 2022-10-19 ENCOUNTER — Ambulatory Visit: Payer: Medicare Other | Admitting: Physical Therapy

## 2022-10-19 VITALS — BP 125/88 | HR 81

## 2022-10-19 DIAGNOSIS — R2689 Other abnormalities of gait and mobility: Secondary | ICD-10-CM

## 2022-10-19 DIAGNOSIS — R2681 Unsteadiness on feet: Secondary | ICD-10-CM

## 2022-10-19 DIAGNOSIS — M6281 Muscle weakness (generalized): Secondary | ICD-10-CM

## 2022-10-19 NOTE — Therapy (Signed)
OUTPATIENT PHYSICAL THERAPY NEURO TREATMENT   Patient Name: Robin Robles MRN: [id number] DOB:1956/02/01, 66 y.o., female Today's Date: 10/19/2022   PCP: Chesley Noon, MD REFERRING PROVIDER: Darliss Cheney, MD   PT End of Session - 10/19/22 0805     Visit Number 4    Number of Visits 17    Date for PT Re-Evaluation 12/02/22    Authorization Type [employer] Medicare    PT Start Time 0803    PT Stop Time 0843    PT Time Calculation (min) 40 min    Equipment Utilized During Treatment Gait belt    Activity Tolerance Patient tolerated treatment well    Behavior During Therapy WFL for tasks assessed/performed              Past Medical History:  Diagnosis Date   Fibromyalgia    Hypertension    Migraines    Past Surgical History:  Procedure Laterality Date   ABDOMINAL HYSTERECTOMY     CHOLECYSTECTOMY     THYROID SURGERY     Patient Active Problem List   Diagnosis Date Noted   Acute CVA (cerebrovascular accident) ([place]) 09/23/2022   HTN (hypertension) 09/23/2022   Depression 09/23/2022   Upper airway cough syndrome 07/31/2017    ONSET DATE: 09/23/2022  REFERRING DIAG: I63.9 (ICD-10-CM) - Acute CVA (cerebrovascular accident) ([place])  THERAPY DIAG:  Unsteadiness on feet  Other abnormalities of gait and mobility  Muscle weakness (generalized)  Rationale for Evaluation and Treatment Rehabilitation  SUBJECTIVE:                                                                                                                                                                                              SUBJECTIVE STATEMENT: Ordered her cane and it has been going well. Has had some off balance and it has been helpful to have.   Pt accompanied by: self  PERTINENT HISTORY: Pt is a 66 y/o F admitted on 09/22/22 with temporal and possible frontal lobe stroke and discharged home on 09/23/22. PMH significant for but not limited to HTN, fibromyalgia, migraines, glaucoma.    PAIN:  Are you having pain? Yes: NPRS scale: 1-2/10 Pain location: Frontal R headache.  Pain description: Throbbing Aggravating factors: Not sure. Relieving factors: Nothing.   Vitals:   10/19/22 0828 10/19/22 0829  BP: (!) 133/97 125/88  Pulse: 83 81     PRECAUTIONS: Fall  FALLS: Has patient fallen in last 6 months? Yes. Number of falls 1, going off a curb.   LIVING ENVIRONMENT: Lives with: lives with their spouse Lives in: House/apartment Stairs: Yes: Internal: 12 steps;  on right going up and has landings so not as steep and External: 3 steps; on right going up Has following equipment at home: Single point cane, Grab bars, and doesn't use the cane.   PLOF: Independent and Leisure: likes Nutritional therapist, sewing.   PATIENT GOALS Wants to correct her balance so she doesn't always feel like shes going to fall.   OBJECTIVE:   TODAY'S TREATMENT:   GAIT:  Gait pattern: decreased arm swing- Left, decreased step length- Right, and decreased step length- Left Distance walked: SPC with 4 prong tip  Assistive device utilized: Single point cane Level of assistance: SBA Comments: Pt ambulated with cane that she purchased. Pt with no issues with sequencing today. Adjusted it to proper height for patient.   NMR:  Single Leg Stance: On blue foam beam, alternating SLS taps to 3 cones for improved SLS time x10 reps bilat, no UE support Side Stepping: Down and back x3 reps on blue foam beam with focus on incr foot clearance Tandem Walking: Down and back x3 reps on blue foam beam, cues for slowed pace  Rockerboard: In A/P direction: Weight shifting x15 reps with focus on full motion, keeping board steady x10 reps mini squats with initial cues to shift weight posteriorly.  10 reps head turns and 10 reps head nods. In M/L direction: x10 reps weight shifting, 10 reps head turns, 10 reps head nods. Pt more challenged with balance in side to side direction, needing intermittent UE support.     Pt reporting feeling lightheaded afterwards. Seated rest break taken with pt reporting feeling better after sitting down.   Forward gait 115' with no AD with vertical ball toss with cues to track with head/eyes,supervision/min guard for balance. Retro gait with ball toss 2 x 30', pt able to maintain incr step length when ambulating.  On blue mat, forward/retro marching x4 reps with EC, pt needing to use bars intermittently for balance.    PATIENT EDUCATION: Education details: Continue HEP, importance of monitoring BP at home and writing it down and letting PCP aware due to higher diastolic value.  Person educated: Patient Education method: Medical illustrator Education comprehension: verbalized understanding   HOME EXERCISE PROGRAM: Access Code: [email or web address] URL: [email or web address] Date: 10/09/2022 Prepared by: Peter Congo  Exercises - Side Stepping with Resistance at Ankles and Counter Support  - 1 x daily - 5 x weekly - 1 sets - 5 reps - Forward Monster Walk with Resistance at Ankles and Counter Support  - 1 x daily - 5 x weekly - 1 sets - 5 reps - Tandem Walking with Counter Support  - 1 x daily - 5 x weekly - 1 sets - 5 reps - Romberg Stance with Head Rotation  - 1 x daily - 5 x weekly - 2 sets - 15 reps   GOALS: Goals reviewed with patient? Yes  SHORT TERM GOALS: Target date: 10/31/2022  Pt will be independent with initial HEP in order to build upon functional gains made in therapy. Baseline: Goal status: INITIAL  2.  Pt will verbalize understanding of fall prevention in the home.  Baseline:  Goal status: INITIAL  3.  Pt will improve FGA to at least a 14/30 in order to demo decr fall risk. Baseline: 11/30 Goal status: INITIAL  4.  Pt will improve condition 3 of mCTSIB to at least 30 seconds in order to demo improved balance.  Baseline: 25 Sec Goal status: INITIAL  5.  Pt will improve gait speed  with no AD vs. LRAD to at least 1.9 ft/sec  in order to demo decr fall risk  Baseline: 20.37 seconds with no AD = 1.61 ft/sec Goal status: INITIAL    LONG TERM GOALS: Target date: 11/28/2022  Pt will be independent with final HEP in order to build upon functional gains made in therapy. Baseline:  Goal status: INITIAL  2.  Pt will improve FGA to at least a 18/30 in order to demo decr fall risk. Baseline: 11/30 Goal status: INITIAL  3.  Pt will improve gait speed with no AD vs. LRAD to at least 2.3 ft/sec in order to demo improved community mobility.  Baseline: 20.37 seconds with no AD = 1.61 ft/sec Goal status: INITIAL  4.  Pt will improve condition 4 of mCTSIB to at least 15 seconds in order to demo improved vestibular input for balance.  Baseline: 5 Sec Goal status: INITIAL  5.  Pt will improve 5x sit<>stand to less than or equal to 16 sec to demonstrate improved functional strength and transfer efficiency.  Baseline: 21.31 seconds without UE support Goal status: INITIAL  6.  Pt will ambulate at least 500' over paved surfaces outdoors with no AD vs. LRAD with supervision in order to demo improved community mobility.  Baseline:  Goal status: INITIAL  ASSESSMENT:  CLINICAL IMPRESSION: Today's skilled session continued to focus on balance on compliant surfaces with decr UE support. Pt ambulated in with [facility] with quad tip that she purchased after last session. Pt feeling lightheaded after standing balance tasks, took BP and diastolic was elevated, but decr to WNL after seated rest break. Pt tolerated progression of balance exercises well, will continue to progress towards LTGs.     OBJECTIVE IMPAIRMENTS Abnormal gait, decreased activity tolerance, decreased balance, decreased coordination, decreased knowledge of use of DME, difficulty walking, decreased strength, postural dysfunction, and pain.   ACTIVITY LIMITATIONS carrying, bending, standing, stairs, transfers, and locomotion level  PARTICIPATION LIMITATIONS: shopping  and community activity  PERSONAL FACTORS Age, Behavior pattern, Past/current experiences, Time since onset of injury/illness/exacerbation, and 3+ comorbidities: HTN, fibromyalgia, migraines, glaucoma.   are also affecting patient's functional outcome.   REHAB POTENTIAL: Good  CLINICAL DECISION MAKING: Evolving/moderate complexity  EVALUATION COMPLEXITY: Moderate  PLAN: PT FREQUENCY: 2x/week  PT DURATION: 8 weeks  PLANNED INTERVENTIONS: Therapeutic exercises, Therapeutic activity, Neuromuscular re-education, Balance training, Gait training, Patient/Family education, Self Care, Stair training, Vestibular training, Canalith repositioning, DME instructions, and Re-evaluation  PLAN FOR NEXT SESSION:  add something with eyes closed to HEP. functional strengthening, balance - EC, unlevel surfaces, narrow BOS, head motions. Continue to work on balance.    Drake Leach, PT, DPT 10/19/2022, 8:45 AM

## 2022-10-23 ENCOUNTER — Ambulatory Visit: Payer: Medicare Other

## 2022-10-23 DIAGNOSIS — R2681 Unsteadiness on feet: Secondary | ICD-10-CM

## 2022-10-23 DIAGNOSIS — M6281 Muscle weakness (generalized): Secondary | ICD-10-CM

## 2022-10-23 DIAGNOSIS — R2689 Other abnormalities of gait and mobility: Secondary | ICD-10-CM

## 2022-10-23 NOTE — Therapy (Signed)
OUTPATIENT PHYSICAL THERAPY NEURO TREATMENT   Patient Name: Robin Robles MRN: [id number] DOB:[birth year], 65 y.o., female Today's Date: 10/23/2022   PCP: Chesley Noon, MD REFERRING PROVIDER: Darliss Cheney, MD   PT End of Session - 10/23/22 1016     Visit Number 5    Number of Visits 17    Date for PT Re-Evaluation 12/02/22    Authorization Type [employer] Medicare    PT Start Time 1016    PT Stop Time 1058    PT Time Calculation (min) 42 min    Equipment Utilized During Treatment Gait belt    Activity Tolerance Patient tolerated treatment well    Behavior During Therapy WFL for tasks assessed/performed              Past Medical History:  Diagnosis Date   Fibromyalgia    Hypertension    Migraines    Past Surgical History:  Procedure Laterality Date   ABDOMINAL HYSTERECTOMY     CHOLECYSTECTOMY     THYROID SURGERY     Patient Active Problem List   Diagnosis Date Noted   Acute CVA (cerebrovascular accident) ([place]) 09/23/2022   HTN (hypertension) 09/23/2022   Depression 09/23/2022   Upper airway cough syndrome 07/31/2017    ONSET DATE: 09/23/2022  REFERRING DIAG: I63.9 (ICD-10-CM) - Acute CVA (cerebrovascular accident) ([place])  THERAPY DIAG:  Unsteadiness on feet  Other abnormalities of gait and mobility  Muscle weakness (generalized)  Rationale for Evaluation and Treatment Rehabilitation  SUBJECTIVE:                                                                                                                                                                                              SUBJECTIVE STATEMENT: Patients reports doing fair. Does have a migraine this AM. Per patient, abortive medications do not help her.   Pt accompanied by: self  PERTINENT HISTORY: Pt is a 66 y/o F admitted on 09/22/22 with temporal and possible frontal lobe stroke and discharged home on 09/23/22. PMH significant for but not limited to HTN, fibromyalgia, migraines, glaucoma.    PAIN:  Are you having pain? Yes: NPRS scale: 1-2/10 Pain location: Frontal R headache.  Pain description: Throbbing Aggravating factors: Not sure. Relieving factors: Nothing.   There were no vitals filed for this visit.    PRECAUTIONS: Fall  FALLS: Has patient fallen in last 6 months? Yes. Number of falls 1, going off a curb.   LIVING ENVIRONMENT: Lives with: lives with their spouse Lives in: House/apartment Stairs: Yes: Internal: 12 steps; on right going up and has landings so not as steep and  External: 3 steps; on right going up Has following equipment at home: Single point cane, Grab bars, and doesn't use the cane.   PLOF: Independent and Leisure: likes Nutritional therapist, sewing.   PATIENT GOALS Wants to correct her balance so she doesn't always feel like shes going to fall.   OBJECTIVE:   TODAY'S TREATMENT:   NMR:  - scifit hills level 2 x10 mins B LE only   BP afterward: 125/74 -anterior step over 4" hurdle starting on [employer] -> pillow   -progressed to anterior chest press with 2.2# ball  - corner semi-tandem stance rotation to targets (increased LOB rotating to L)   Tandem Stance:  Surface: Floor Completed with: Eyes Open and Eyes Closed; Head Turns x 30 Reps and Head Nods x 30 Reps       PATIENT EDUCATION: Education details: continue HEP  Person educated: Patient Education method: Medical illustrator Education comprehension: verbalized understanding   HOME EXERCISE PROGRAM: Access Code: [email or web address] URL: [email or web address] Date: 10/09/2022 Prepared by: Peter Congo  Exercises - Side Stepping with Resistance at Ankles and Counter Support  - 1 x daily - 5 x weekly - 1 sets - 5 reps - Forward Monster Walk with Resistance at Ankles and Counter Support  - 1 x daily - 5 x weekly - 1 sets - 5 reps - Tandem Walking with Counter Support  - 1 x daily - 5 x weekly - 1 sets - 5 reps - Romberg Stance with Head Rotation  - 1 x daily - 5 x  weekly - 2 sets - 15 reps   GOALS: Goals reviewed with patient? Yes  SHORT TERM GOALS: Target date: 10/31/2022  Pt will be independent with initial HEP in order to build upon functional gains made in therapy. Baseline: Goal status: INITIAL  2.  Pt will verbalize understanding of fall prevention in the home.  Baseline:  Goal status: INITIAL  3.  Pt will improve FGA to at least a 14/30 in order to demo decr fall risk. Baseline: 11/30 Goal status: INITIAL  4.  Pt will improve condition 3 of mCTSIB to at least 30 seconds in order to demo improved balance.  Baseline: 25 Sec Goal status: INITIAL  5.  Pt will improve gait speed with no AD vs. LRAD to at least 1.9 ft/sec in order to demo decr fall risk  Baseline: 20.37 seconds with no AD = 1.61 ft/sec Goal status: INITIAL    LONG TERM GOALS: Target date: 11/28/2022  Pt will be independent with final HEP in order to build upon functional gains made in therapy. Baseline:  Goal status: INITIAL  2.  Pt will improve FGA to at least a 18/30 in order to demo decr fall risk. Baseline: 11/30 Goal status: INITIAL  3.  Pt will improve gait speed with no AD vs. LRAD to at least 2.3 ft/sec in order to demo improved community mobility.  Baseline: 20.37 seconds with no AD = 1.61 ft/sec Goal status: INITIAL  4.  Pt will improve condition 4 of mCTSIB to at least 15 seconds in order to demo improved vestibular input for balance.  Baseline: 5 Sec Goal status: INITIAL  5.  Pt will improve 5x sit<>stand to less than or equal to 16 sec to demonstrate improved functional strength and transfer efficiency.  Baseline: 21.31 seconds without UE support Goal status: INITIAL  6.  Pt will ambulate at least 500' over paved surfaces outdoors with no AD vs. LRAD with supervision in order to  demo improved community mobility.  Baseline:  Goal status: INITIAL  ASSESSMENT:  CLINICAL IMPRESSION: Patient seen for skilled PT session with emphasis on  functional strength and balance retraining. Reports needing to use her SPC more frequently when she has a migraine, which she has today. Increased and appropriate ankle strategy noted when anterior steps over 4" hurdle onto compliant surface, but tendency toward LOB posteriorly with minimal correcting unless using mat as external support. Progressing corner balance with EC on compliant surfaces. Continue POC.     OBJECTIVE IMPAIRMENTS Abnormal gait, decreased activity tolerance, decreased balance, decreased coordination, decreased knowledge of use of DME, difficulty walking, decreased strength, postural dysfunction, and pain.   ACTIVITY LIMITATIONS carrying, bending, standing, stairs, transfers, and locomotion level  PARTICIPATION LIMITATIONS: shopping and community activity  PERSONAL FACTORS Age, Behavior pattern, Past/current experiences, Time since onset of injury/illness/exacerbation, and 3+ comorbidities: HTN, fibromyalgia, migraines, glaucoma.   are also affecting patient's functional outcome.   REHAB POTENTIAL: Good  CLINICAL DECISION MAKING: Evolving/moderate complexity  EVALUATION COMPLEXITY: Moderate  PLAN: PT FREQUENCY: 2x/week  PT DURATION: 8 weeks  PLANNED INTERVENTIONS: Therapeutic exercises, Therapeutic activity, Neuromuscular re-education, Balance training, Gait training, Patient/Family education, Self Care, Stair training, Vestibular training, Canalith repositioning, DME instructions, and Re-evaluation  PLAN FOR NEXT SESSION:   functional strengthening, balance - EC, unlevel surfaces, narrow BOS, head motions. Continue to work on balance.    Westley Foots, PT, DPT Westley Foots, PT, DPT, CBIS  10/23/2022, 11:00 AM

## 2022-10-26 ENCOUNTER — Ambulatory Visit: Payer: Medicare Other | Attending: Family Medicine | Admitting: Physical Therapy

## 2022-10-26 ENCOUNTER — Encounter: Payer: Self-pay | Admitting: Physical Therapy

## 2022-10-26 DIAGNOSIS — R2689 Other abnormalities of gait and mobility: Secondary | ICD-10-CM | POA: Diagnosis present

## 2022-10-26 DIAGNOSIS — M6281 Muscle weakness (generalized): Secondary | ICD-10-CM | POA: Diagnosis present

## 2022-10-26 DIAGNOSIS — R2681 Unsteadiness on feet: Secondary | ICD-10-CM | POA: Diagnosis present

## 2022-10-26 NOTE — Therapy (Signed)
OUTPATIENT PHYSICAL THERAPY NEURO TREATMENT   Patient Name: Robin Robles MRN: [id number] DOB:03-12-1956, 66 y.o., female Today's Date: 10/26/2022   PCP: Eartha Inch, MD REFERRING PROVIDER: Hughie Closs, MD   PT End of Session - 10/26/22 [phone]     Visit Number 6    Number of Visits 17    Date for PT Re-Evaluation 12/02/22    Authorization Type [employer] Medicare    PT Start Time [phone]   pt late to session   PT Stop Time 1015    PT Time Calculation (min) 39 min    Equipment Utilized During Treatment Gait belt    Activity Tolerance Patient tolerated treatment well    Behavior During Therapy WFL for tasks assessed/performed              Past Medical History:  Diagnosis Date   Fibromyalgia    Hypertension    Migraines    Past Surgical History:  Procedure Laterality Date   ABDOMINAL HYSTERECTOMY     CHOLECYSTECTOMY     THYROID SURGERY     Patient Active Problem List   Diagnosis Date Noted   Acute CVA (cerebrovascular accident) ([facility]) 09/23/2022   HTN (hypertension) 09/23/2022   Depression 09/23/2022   Upper airway cough syndrome 07/31/2017    ONSET DATE: 09/23/2022  REFERRING DIAG: I63.9 (ICD-10-CM) - Acute CVA (cerebrovascular accident) ([facility])  THERAPY DIAG:  Unsteadiness on feet  Other abnormalities of gait and mobility  Muscle weakness (generalized)  Rationale for Evaluation and Treatment Rehabilitation  SUBJECTIVE:                                                                                                                                                                                              SUBJECTIVE STATEMENT: No changes since she was last here. Has not been using her cane as much and feeling more balanced. Had a bad headache yesterday, but none today. No falls.   Pt accompanied by: self  PERTINENT HISTORY: Pt is a 66 y/o F admitted on 09/22/22 with temporal and possible frontal lobe stroke and discharged home on 09/23/22. PMH  significant for but not limited to HTN, fibromyalgia, migraines, glaucoma.   PAIN:  Are you having pain? No  There were no vitals filed for this visit.    PRECAUTIONS: Fall  FALLS: Has patient fallen in last 6 months? Yes. Number of falls 1, going off a curb.   LIVING ENVIRONMENT: Lives with: lives with their spouse Lives in: House/apartment Stairs: Yes: Internal: 12 steps; on right going up and has landings so not as steep and External: 3 steps; on  right going up Has following equipment at home: Single point cane, Grab bars, and doesn't use the cane.   PLOF: Independent and Leisure: likes Nutritional therapist, sewing.   PATIENT GOALS Wants to correct her balance so she doesn't always feel like shes going to fall.   OBJECTIVE:   TODAY'S TREATMENT:  Pt ambulating with no AD during session with supervision.   NMR:   On black side of BOSU -15 reps A/P and 15 reps lateral weight shifting -10 reps mini squats -15 reps vertical ball toss with cues to track with head/eyes  On rockerboard in A/P direction: -Keeping board steady EC: 3 x 30 seconds static balance, 2 x 10 reps head turns, 2 x 10 reps head nods. Intermittent taps to bars needed for balance.  SLS: throwing ball into rebounder and catching it 2 sets of 15 reps each leg, min guard as needed for balance. Pt needing to tap foot intermittently for balance.  Forward gait with CW and CCW circles with ball with cues to follow with head/eyes 2 x 30' of each, pt initially more off balance, but improved with incr reps Forward gait with ball toss to R/L diagonal directions 2 x 30', pt staggering at times, but no LOB  On blue foam beam, 2 sets of 5 reps EC sit <> stands, pt initially bracing against mat table, cues for incr forward lean With 6 stepping stones, SLS down and back x5 reps with focus on slowed pace. Performed with cog challenge naming foods in alphabetical order from A-Z. Pt needing intermittent taps for balance with dual  tasking.      PATIENT EDUCATION: Education details: continue HEP  Person educated: Patient Education method: Medical illustrator Education comprehension: verbalized understanding   HOME EXERCISE PROGRAM: Access Code: [email or web address] URL: [email or web address] Date: 10/26/2022 Prepared by: Sherlie Ban  Exercises - Side Stepping with Resistance at Ankles and Counter Support  - 1 x daily - 5 x weekly - 1 sets - 5 reps - Forward Monster Walk with Resistance at Ankles and Counter Support  - 1 x daily - 5 x weekly - 1 sets - 5 reps - Tandem Walking with Counter Support  - 1 x daily - 5 x weekly - 1 sets - 5 reps - Romberg Stance with Head Rotation  - 1 x daily - 5 x weekly - 2 sets - 15 reps - Semi-Tandem Corner Balance With Eyes Closed  - 1 x daily - 7 x weekly - 3 sets - 30s hold - Semi-Tandem Corner Balance: Eyes Open With Head Turns  - 1 x daily - 7 x weekly - 3 sets - 30s hold  GOALS: Goals reviewed with patient? Yes  SHORT TERM GOALS: Target date: 10/31/2022  Pt will be independent with initial HEP in order to build upon functional gains made in therapy. Baseline: Goal status: INITIAL  2.  Pt will verbalize understanding of fall prevention in the home.  Baseline:  Goal status: INITIAL  3.  Pt will improve FGA to at least a 14/30 in order to demo decr fall risk. Baseline: 11/30 Goal status: INITIAL  4.  Pt will improve condition 3 of mCTSIB to at least 30 seconds in order to demo improved balance.  Baseline: 25 Sec Goal status: INITIAL  5.  Pt will improve gait speed with no AD vs. LRAD to at least 1.9 ft/sec in order to demo decr fall risk  Baseline: 20.37 seconds with no AD = 1.61 ft/sec Goal status: INITIAL  LONG TERM GOALS: Target date: 11/28/2022  Pt will be independent with final HEP in order to build upon functional gains made in therapy. Baseline:  Goal status: INITIAL  2.  Pt will improve FGA to at least a 18/30 in order to demo  decr fall risk. Baseline: 11/30 Goal status: INITIAL  3.  Pt will improve gait speed with no AD vs. LRAD to at least 2.3 ft/sec in order to demo improved community mobility.  Baseline: 20.37 seconds with no AD = 1.61 ft/sec Goal status: INITIAL  4.  Pt will improve condition 4 of mCTSIB to at least 15 seconds in order to demo improved vestibular input for balance.  Baseline: 5 Sec Goal status: INITIAL  5.  Pt will improve 5x sit<>stand to less than or equal to 16 sec to demonstrate improved functional strength and transfer efficiency.  Baseline: 21.31 seconds without UE support Goal status: INITIAL  6.  Pt will ambulate at least 500' over paved surfaces outdoors with no AD vs. LRAD with supervision in order to demo improved community mobility.  Baseline:  Goal status: INITIAL  ASSESSMENT:  CLINICAL IMPRESSION: Today's skilled session continued to focus on high level balance strategies with unlevel surfaces, SLS, and dynamic gait tasks. Pt more challenged with SLS with addition of cog dual tasking, with needing to use counter at times for balance. Pt did not bring in her cane today and was able to ambulate during session with no AD with supervision. Pt is demonstrating significantly improved balance since eval.  Will continue to progress towards LTGs     OBJECTIVE IMPAIRMENTS Abnormal gait, decreased activity tolerance, decreased balance, decreased coordination, decreased knowledge of use of DME, difficulty walking, decreased strength, postural dysfunction, and pain.   ACTIVITY LIMITATIONS carrying, bending, standing, stairs, transfers, and locomotion level  PARTICIPATION LIMITATIONS: shopping and community activity  PERSONAL FACTORS Age, Behavior pattern, Past/current experiences, Time since onset of injury/illness/exacerbation, and 3+ comorbidities: HTN, fibromyalgia, migraines, glaucoma.   are also affecting patient's functional outcome.   REHAB POTENTIAL: Good  CLINICAL  DECISION MAKING: Evolving/moderate complexity  EVALUATION COMPLEXITY: Moderate  PLAN: PT FREQUENCY: 2x/week  PT DURATION: 8 weeks  PLANNED INTERVENTIONS: Therapeutic exercises, Therapeutic activity, Neuromuscular re-education, Balance training, Gait training, Patient/Family education, Self Care, Stair training, Vestibular training, Canalith repositioning, DME instructions, and Re-evaluation  PLAN FOR NEXT SESSION:  Check STGs. Work on balance outdoors if weather is not as cold. functional strengthening, balance - EC, unlevel surfaces, narrow BOS, head motions.    Arliss Journey, PT, DPT 10/26/2022, 10:16 AM

## 2022-10-30 ENCOUNTER — Ambulatory Visit: Payer: Medicare Other | Admitting: Physical Therapy

## 2022-10-30 ENCOUNTER — Encounter: Payer: Self-pay | Admitting: Physical Therapy

## 2022-10-30 DIAGNOSIS — M6281 Muscle weakness (generalized): Secondary | ICD-10-CM

## 2022-10-30 DIAGNOSIS — R2681 Unsteadiness on feet: Secondary | ICD-10-CM

## 2022-10-30 DIAGNOSIS — R2689 Other abnormalities of gait and mobility: Secondary | ICD-10-CM

## 2022-10-30 NOTE — Therapy (Signed)
OUTPATIENT PHYSICAL THERAPY NEURO TREATMENT   Patient Name: Robin Robles MRN: [id number] DOB:1956/12/04, 66 y.o., female Today's Date: 10/30/2022   PCP: Chesley Noon, MD REFERRING PROVIDER: Darliss Cheney, MD   PT End of Session - 10/30/22 [id number]     Visit Number 7    Number of Visits 17    Date for PT Re-Evaluation 12/02/22    Authorization Type [employer] Medicare    PT Start Time 0927    PT Stop Time 1009    PT Time Calculation (min) 42 min    Equipment Utilized During Treatment Gait belt    Activity Tolerance Patient tolerated treatment well    Behavior During Therapy WFL for tasks assessed/performed              Past Medical History:  Diagnosis Date   Fibromyalgia    Hypertension    Migraines    Past Surgical History:  Procedure Laterality Date   ABDOMINAL HYSTERECTOMY     CHOLECYSTECTOMY     THYROID SURGERY     Patient Active Problem List   Diagnosis Date Noted   Acute CVA (cerebrovascular accident) ([place]) 09/23/2022   HTN (hypertension) 09/23/2022   Depression 09/23/2022   Upper airway cough syndrome 07/31/2017    ONSET DATE: 09/23/2022  REFERRING DIAG: I63.9 (ICD-10-CM) - Acute CVA (cerebrovascular accident) ([place])  THERAPY DIAG:  Unsteadiness on feet  Other abnormalities of gait and mobility  Muscle weakness (generalized)  Rationale for Evaluation and Treatment Rehabilitation  SUBJECTIVE:                                                                                                                                                                                              SUBJECTIVE STATEMENT: No changes. Brought in her cane today. Wasn't using her cane over the weekend. Was a little wobbly on some of the sidewalks when she was with her husband. No falls. Exercises with her eyes closed are the most difficult.   Pt accompanied by: self  PERTINENT HISTORY: Pt is a 66 y/o F admitted on 09/22/22 with temporal and possible frontal lobe stroke  and discharged home on 09/23/22. PMH significant for but not limited to HTN, fibromyalgia, migraines, glaucoma.   PAIN:  Are you having pain? No  There were no vitals filed for this visit.  PRECAUTIONS: Fall  FALLS: Has patient fallen in last 6 months? Yes. Number of falls 1, going off a curb.   PLOF: Independent and Leisure: likes Social worker, sewing.   PATIENT GOALS Wants to correct her balance so she doesn't always feel like shes going to fall.  OBJECTIVE:   TODAY'S TREATMENT:   Therapeutic Activity Goal Assessment:   [name] PT Assessment - 10/30/22 0931       Ambulation/Gait   Gait velocity 11.75 seconds = 2.79 ft/sec   with no AD     Functional Gait  Assessment   Gait assessed  Yes    Gait Level Surface Walks 20 ft in less than 7 sec but greater than 5.5 sec, uses assistive device, slower speed, mild gait deviations, or deviates 6-10 in outside of the 12 in walkway width.   6.21   Change in Gait Speed Able to smoothly change walking speed without loss of balance or gait deviation. Deviate no more than 6 in outside of the 12 in walkway width.    Gait with Horizontal Head Turns Performs head turns smoothly with no change in gait. Deviates no more than 6 in outside 12 in walkway width    Gait with Vertical Head Turns Performs task with slight change in gait velocity (eg, minor disruption to smooth gait path), deviates 6 - 10 in outside 12 in walkway width or uses assistive device    Gait and Pivot Turn Pivot turns safely in greater than 3 sec and stops with no loss of balance, or pivot turns safely within 3 sec and stops with mild imbalance, requires small steps to catch balance.    Step Over Obstacle Is able to step over 2 stacked shoe boxes taped together (9 in total height) without changing gait speed. No evidence of imbalance.    Gait with Narrow Base of Support Is able to ambulate for 10 steps heel to toe with no staggering.    Gait with Eyes Closed Walks 20 ft, slow  speed, abnormal gait pattern, evidence for imbalance, deviates 10-15 in outside 12 in walkway width. Requires more than 9 sec to ambulate 20 ft.   11.3 seconds   Ambulating Backwards Walks 20 ft, uses assistive device, slower speed, mild gait deviations, deviates 6-10 in outside 12 in walkway width.   17 seconds   Steps Alternating feet, no rail.    Total Score 24    FGA comment: 24/30 = Medium Fall Risk              M-CTSIB  Condition 1: Firm Surface, EO 30 Sec, Normal Sway  Condition 2: Firm Surface, EC 30 Sec, Normal Sway  Condition 3: Foam Surface, EO 30 Sec, Normal Sway  Condition 4: Foam Surface, EC 30 Sec, Mild Sway       CURB:  Level of Assistance: SBA Assistive device utilized: None Curb Comments: x3 reps, initial cues for sequencing. Pt descending with weaker LLE.    GAIT: Gait pattern: WFL, step through pattern, decreased arm swing- Right, and decreased arm swing- Left Distance walked: ~700' outdoors over pavement and grass  Assistive device utilized: None Level of assistance: SBA Comments: Ambulated over grass and pavement with supervision. Initial cues for looking ahead with gait on grass vs. Down at the ground. Over grass added in head motions with head turns 2 x 20' and head nods 2 x 20', pt with no LOB, just ambulated more slowly.   Performed 115' forward gait indoors with 2nd PT providing steady resistance, then an additional 60' with random pertubations. Pt able to maintain balance throughout.      PATIENT EDUCATION: Education details: Progress towards goals, fall prevention handout, discussed that pt can ambulate without using her cane, but have it when needing to walk longer distances when  she might get fatigued or on more unlevel surfaces.  Person educated: Patient Education method: Explanation, Demonstration, and Handouts Education comprehension: verbalized understanding   HOME EXERCISE PROGRAM: Access Code: [email or web address] URL:  [email or web address] Date: 10/26/2022 Prepared by: Janann August  Exercises - Side Stepping with Resistance at Ankles and Counter Support  - 1 x daily - 5 x weekly - 1 sets - 5 reps - Forward Monster Walk with Resistance at Ankles and Counter Support  - 1 x daily - 5 x weekly - 1 sets - 5 reps - Tandem Walking with Counter Support  - 1 x daily - 5 x weekly - 1 sets - 5 reps - Romberg Stance with Head Rotation  - 1 x daily - 5 x weekly - 2 sets - 15 reps - Semi-Tandem Corner Balance With Eyes Closed  - 1 x daily - 7 x weekly - 3 sets - 30s hold - Semi-Tandem Corner Balance: Eyes Open With Head Turns  - 1 x daily - 7 x weekly - 3 sets - 30s hold  GOALS: Goals reviewed with patient? Yes  SHORT TERM GOALS: Target date: 10/31/2022  Pt will be independent with initial HEP in order to build upon functional gains made in therapy. Baseline: Goal status: MET  2.  Pt will verbalize understanding of fall prevention in the home.  Baseline: handout provided on 10/30/22 Goal status: MET  3.  Pt will improve FGA to at least a 14/30 in order to demo decr fall risk. Baseline: 11/30; 24/30 on 10/30/22 Goal status: MET  4.  Pt will improve condition 3 of mCTSIB to at least 30 seconds in order to demo improved balance.  Baseline: 25 Sec; 30 seconds on 10/30/22 Goal status: MET  5.  Pt will improve gait speed with no AD vs. LRAD to at least 1.9 ft/sec in order to demo decr fall risk  Baseline: 20.37 seconds with no AD = 1.61 ft/sec; 2.79 ft/sec on 10/30/22  Goal status: MET    LONG TERM GOALS: Target date: 11/28/2022  Pt will be independent with final HEP in order to build upon functional gains made in therapy. Baseline:  Goal status: INITIAL  2.  Pt will improve FGA to at least a 27/30 in order to demo decr fall risk. Baseline: 11/30; 24/30 on 10/30/22 Goal status: REVISED  3.  Pt will improve gait speed with no AD vs. LRAD to at least 3.2 ft/sec in order to demo improved  community mobility.  Baseline: 20.37 seconds with no AD = 1.61 ft/sec, 2.79 ft/sec on 10/30/22 Goal status: REVISED  4.  Pt will improve condition 4 of mCTSIB to at least 15 seconds in order to demo improved vestibular input for balance.  Baseline: 5 Sec; 30 seconds on 10/30/22 Goal status: MET  5.  Pt will improve 5x sit<>stand to less than or equal to 16 sec to demonstrate improved functional strength and transfer efficiency.  Baseline: 21.31 seconds without UE support Goal status: INITIAL  6.  Pt will ambulate at least 500' over paved surfaces outdoors with no AD vs. LRAD with supervision in order to demo improved community mobility.  Baseline:  Goal status: INITIAL  ASSESSMENT:  CLINICAL IMPRESSION: Today's skilled session focused on assessing pt's STGs. Pt has met 5 out of 5 STGs. Pt significantly improved her gait speed with no AD to 2.79 ft/sec, indicating a community ambulator (previously was 1.61 ft/sec). Pt improved FGA to a 24/30, indicating a moderate fall risk (previously was 11/30).  Pt able to ambulate on outdoor unlevel surfaces with no AD with supervision. Pt able to hold condition 4 of mCTSIB for 30 seconds (previously 5 seconds), indicating improved vestibular input for balance. Discussed due to pt's amazing progress, anticipate D/C after a few more visits. Pt in agreement with this plan. Will continue to progress towards LTGs.     OBJECTIVE IMPAIRMENTS Abnormal gait, decreased activity tolerance, decreased balance, decreased coordination, decreased knowledge of use of DME, difficulty walking, decreased strength, postural dysfunction, and pain.   ACTIVITY LIMITATIONS carrying, bending, standing, stairs, transfers, and locomotion level  PARTICIPATION LIMITATIONS: shopping and community activity  PERSONAL FACTORS Age, Behavior pattern, Past/current experiences, Time since onset of injury/illness/exacerbation, and 3+ comorbidities: HTN, fibromyalgia, migraines, glaucoma.    are also affecting patient's functional outcome.   REHAB POTENTIAL: Good  CLINICAL DECISION MAKING: Evolving/moderate complexity  EVALUATION COMPLEXITY: Moderate  PLAN: PT FREQUENCY: 2x/week  PT DURATION: 8 weeks  PLANNED INTERVENTIONS: Therapeutic exercises, Therapeutic activity, Neuromuscular re-education, Balance training, Gait training, Patient/Family education, Self Care, Stair training, Vestibular training, Canalith repositioning, DME instructions, and Re-evaluation  PLAN FOR NEXT SESSION:  Work on high level balance outside if weather is not cold. High level dynamic gait tasks with no AD, quick turns, EC, head motions. Anticipate early D/C based on pt's progress.    Arliss Journey, PT, DPT 10/30/2022, 10:11 AM

## 2022-10-30 NOTE — Patient Instructions (Signed)

## 2022-11-02 ENCOUNTER — Ambulatory Visit: Payer: Medicare Other

## 2022-11-06 ENCOUNTER — Ambulatory Visit: Payer: Medicare Other | Admitting: Physical Therapy

## 2022-11-06 ENCOUNTER — Encounter: Payer: Self-pay | Admitting: Physical Therapy

## 2022-11-06 DIAGNOSIS — R2681 Unsteadiness on feet: Secondary | ICD-10-CM | POA: Diagnosis not present

## 2022-11-06 DIAGNOSIS — R2689 Other abnormalities of gait and mobility: Secondary | ICD-10-CM

## 2022-11-06 DIAGNOSIS — M6281 Muscle weakness (generalized): Secondary | ICD-10-CM

## 2022-11-06 NOTE — Therapy (Signed)
OUTPATIENT PHYSICAL THERAPY NEURO TREATMENT   Patient Name: Robin Robles MRN: [id number] DOB:August 16, 1956, 66 y.o., female Today's Date: 11/06/2022   PCP: Chesley Noon, MD REFERRING PROVIDER: Darliss Cheney, MD   PT End of Session - 11/06/22 0932     Visit Number 8    Number of Visits 17    Date for PT Re-Evaluation 12/02/22    Authorization Type [employer] Medicare    PT Start Time 0932    PT Stop Time 1011    PT Time Calculation (min) 39 min    Equipment Utilized During Treatment Gait belt    Activity Tolerance Patient tolerated treatment well    Behavior During Therapy WFL for tasks assessed/performed              Past Medical History:  Diagnosis Date   Fibromyalgia    Hypertension    Migraines    Past Surgical History:  Procedure Laterality Date   ABDOMINAL HYSTERECTOMY     CHOLECYSTECTOMY     THYROID SURGERY     Patient Active Problem List   Diagnosis Date Noted   Acute CVA (cerebrovascular accident) ([place]) 09/23/2022   HTN (hypertension) 09/23/2022   Depression 09/23/2022   Upper airway cough syndrome 07/31/2017    ONSET DATE: 09/23/2022  REFERRING DIAG: I63.9 (ICD-10-CM) - Acute CVA (cerebrovascular accident) ([place])  THERAPY DIAG:  Unsteadiness on feet  Other abnormalities of gait and mobility  Muscle weakness (generalized)  Rationale for Evaluation and Treatment Rehabilitation  SUBJECTIVE:                                                                                                                                                                                              SUBJECTIVE STATEMENT: No changes. Only using cane when going out for longer distances or unlevel surfaces.   Pt accompanied by: self  PERTINENT HISTORY: Pt is a 66 y/o F admitted on 09/22/22 with temporal and possible frontal lobe stroke and discharged home on 09/23/22. PMH significant for but not limited to HTN, fibromyalgia, migraines, glaucoma.   PAIN:  Are you  having pain? No  There were no vitals filed for this visit.  PRECAUTIONS: Fall  FALLS: Has patient fallen in last 6 months? Yes. Number of falls 1, going off a curb.   PLOF: Independent and Leisure: likes Social worker, sewing.   PATIENT GOALS Wants to correct her balance so she doesn't always feel like shes going to fall.    OBJECTIVE:   TODAY'S TREATMENT:   Therapeutic Exercise: SciFit with BLE at gear 2.5 for 8 minutes with [place] for strengthening,  activity tolerance.   Monster walks forward/backwards down and back 3 reps at countertop with green tband around ankles, side stepping down and back 3 reps with same resistance. Cues for mini squat position.   NMR:    On black side of BOSU -With feet apart > more narrow BOS 2 sets of 10 reps head turns, 2 sets of 10 reps head nods On blue side of BOSU: -Alternating forward step ups with contralateral march x12 reps each leg, pt needing to use fingertip support -With feet slightly apart: static balance 2 x 30 seconds for ankle strategy, intermittent taps to bars as needed  On thick blue foam: with feet together EC 2 x 30 seconds  Sit to stands x10 reps on rockerboard for immediate standing balance Forward gait with turning trunk/head and handing ball to therapist alternating sides to R/L over 230', no LOB and gradually incr the speed when performing Gait with quick turns, x8 reps to R/L, pt with no LOB and able to maintain balance   GAIT: Gait pattern: WFL, step through pattern, decreased arm swing- Right, and decreased arm swing- Left Distance walked: Clinic distances  Assistive device utilized: None Level of assistance: SBA Comments: No LOB.     PATIENT EDUCATION: Education details: Continue with HEP - progressing band exercises to use of green tband  Person educated: Patient Education method: Explanation, Demonstration, and Handouts Education comprehension: verbalized understanding   HOME EXERCISE  PROGRAM: Access Code: [email or web address] URL: [email or web address] Date: 10/26/2022 Prepared by: Janann August  Exercises - Side Stepping with Resistance at Ankles and Counter Support  - 1 x daily - 5 x weekly - 1 sets - 5 reps -green band  - Forward Monster Walk with Resistance at Ankles and Counter Support  - 1 x daily - 5 x weekly - 1 sets - 5 reps -green band - Tandem Walking with Counter Support  - 1 x daily - 5 x weekly - 1 sets - 5 reps - Romberg Stance with Head Rotation  - 1 x daily - 5 x weekly - 2 sets - 15 reps - Semi-Tandem Corner Balance With Eyes Closed  - 1 x daily - 7 x weekly - 3 sets - 30s hold - Semi-Tandem Corner Balance: Eyes Open With Head Turns  - 1 x daily - 7 x weekly - 3 sets - 30s hold  GOALS: Goals reviewed with patient? Yes  SHORT TERM GOALS: Target date: 10/31/2022  Pt will be independent with initial HEP in order to build upon functional gains made in therapy. Baseline: Goal status: MET  2.  Pt will verbalize understanding of fall prevention in the home.  Baseline: handout provided on 10/30/22 Goal status: MET  3.  Pt will improve FGA to at least a 14/30 in order to demo decr fall risk. Baseline: 11/30; 24/30 on 10/30/22 Goal status: MET  4.  Pt will improve condition 3 of mCTSIB to at least 30 seconds in order to demo improved balance.  Baseline: 25 Sec; 30 seconds on 10/30/22 Goal status: MET  5.  Pt will improve gait speed with no AD vs. LRAD to at least 1.9 ft/sec in order to demo decr fall risk  Baseline: 20.37 seconds with no AD = 1.61 ft/sec; 2.79 ft/sec on 10/30/22  Goal status: MET    LONG TERM GOALS: Target date: 11/28/2022  Pt will be independent with final HEP in order to build upon functional gains made in therapy. Baseline:  Goal status: INITIAL  2.  Pt will improve FGA to at least a 27/30 in order to demo decr fall risk. Baseline: 11/30; 24/30 on 10/30/22 Goal status: REVISED  3.  Pt will improve gait speed with no AD  vs. LRAD to at least 3.2 ft/sec in order to demo improved community mobility.  Baseline: 20.37 seconds with no AD = 1.61 ft/sec, 2.79 ft/sec on 10/30/22 Goal status: REVISED  4.  Pt will improve condition 4 of mCTSIB to at least 15 seconds in order to demo improved vestibular input for balance.  Baseline: 5 Sec; 30 seconds on 10/30/22 Goal status: MET  5.  Pt will improve 5x sit<>stand to less than or equal to 16 sec to demonstrate improved functional strength and transfer efficiency.  Baseline: 21.31 seconds without UE support Goal status: INITIAL  6.  Pt will ambulate at least 500' over paved surfaces outdoors with no AD vs. LRAD with supervision in order to demo improved community mobility.  Baseline:  Goal status: INITIAL  ASSESSMENT:  CLINICAL IMPRESSION: Today's skilled session continued to focus on strengthening and standing balance strategies with dynamic gait tasks and on compliant surfaces. Pt tolerating progression of exercises well. Pt able to perform quick turns with no LOB (improvement since last week when assessed on FGA). Will continue to progress towards LTGs. Due to progress, plan for D/C next week with pt in agreement with plan.     OBJECTIVE IMPAIRMENTS Abnormal gait, decreased activity tolerance, decreased balance, decreased coordination, decreased knowledge of use of DME, difficulty walking, decreased strength, postural dysfunction, and pain.   ACTIVITY LIMITATIONS carrying, bending, standing, stairs, transfers, and locomotion level  PARTICIPATION LIMITATIONS: shopping and community activity  PERSONAL FACTORS Age, Behavior pattern, Past/current experiences, Time since onset of injury/illness/exacerbation, and 3+ comorbidities: HTN, fibromyalgia, migraines, glaucoma.   are also affecting patient's functional outcome.   REHAB POTENTIAL: Good  CLINICAL DECISION MAKING: Evolving/moderate complexity  EVALUATION COMPLEXITY: Moderate  PLAN: PT FREQUENCY:  2x/week  PT DURATION: 8 weeks  PLANNED INTERVENTIONS: Therapeutic exercises, Therapeutic activity, Neuromuscular re-education, Balance training, Gait training, Patient/Family education, Self Care, Stair training, Vestibular training, Canalith repositioning, DME instructions, and Re-evaluation  PLAN FOR NEXT SESSION:  check goals and D/C!    Arliss Journey, PT, DPT 11/06/2022, 10:13 AM

## 2022-11-09 ENCOUNTER — Encounter: Payer: Self-pay | Admitting: Physical Therapy

## 2022-11-09 ENCOUNTER — Ambulatory Visit: Payer: Medicare Other | Admitting: Physical Therapy

## 2022-11-09 DIAGNOSIS — M6281 Muscle weakness (generalized): Secondary | ICD-10-CM

## 2022-11-09 DIAGNOSIS — R2681 Unsteadiness on feet: Secondary | ICD-10-CM

## 2022-11-09 DIAGNOSIS — R2689 Other abnormalities of gait and mobility: Secondary | ICD-10-CM

## 2022-11-09 NOTE — Therapy (Signed)
OUTPATIENT PHYSICAL THERAPY NEURO TREATMENT/DISCHARGE SUMMARY    Patient Name: Robin Robles MRN: [id number] DOB:[birth year], 66 y.o., female Today's Date: 11/09/2022   PCP: Chesley Noon, MD REFERRING PROVIDER: Darliss Cheney, MD   PT End of Session - 11/09/22 [phone]     Visit Number 9    Number of Visits 17    Date for PT Re-Evaluation 12/02/22    Authorization Type [employer] Medicare    PT Start Time [phone]    PT Stop Time 1000   full time not used due to D/C visit   PT Time Calculation (min) 24 min    Equipment Utilized During Treatment Gait belt    Activity Tolerance Patient tolerated treatment well    Behavior During Therapy WFL for tasks assessed/performed              Past Medical History:  Diagnosis Date   Fibromyalgia    Hypertension    Migraines    Past Surgical History:  Procedure Laterality Date   ABDOMINAL HYSTERECTOMY     CHOLECYSTECTOMY     THYROID SURGERY     Patient Active Problem List   Diagnosis Date Noted   Acute CVA (cerebrovascular accident) ([place]) 09/23/2022   HTN (hypertension) 09/23/2022   Depression 09/23/2022   Upper airway cough syndrome 07/31/2017    ONSET DATE: 09/23/2022  REFERRING DIAG: I63.9 (ICD-10-CM) - Acute CVA (cerebrovascular accident) ([place])  THERAPY DIAG:  Unsteadiness on feet  Muscle weakness (generalized)  Other abnormalities of gait and mobility  Rationale for Evaluation and Treatment Rehabilitation  SUBJECTIVE:                                                                                                                                                                                              SUBJECTIVE STATEMENT: No changes. Reports doing the green band for resistance at her ankles irritated her back. Pt went back to using her red band and that felt better.   Pt accompanied by: self  PERTINENT HISTORY: Pt is a 66 y/o F admitted on 09/22/22 with temporal and possible frontal lobe stroke and discharged  home on 09/23/22. PMH significant for but not limited to HTN, fibromyalgia, migraines, glaucoma.   PAIN:  Are you having pain? No  There were no vitals filed for this visit.  PRECAUTIONS: Fall  FALLS: Has patient fallen in last 6 months? Yes. Number of falls 1, going off a curb.   PLOF: Independent and Leisure: likes Social worker, sewing.   PATIENT GOALS Wants to correct her balance so she doesn't always feel like shes going to fall.  OBJECTIVE:   TODAY'S TREATMENT:  Goal Assessment: 5x sit <> stand: 9 seconds with no UE support Gait speed: 10.10 seconds = 3.24 ft/sec   [facility] PT Assessment - 11/09/22 0943       Functional Gait  Assessment   Gait assessed  Yes    Gait Level Surface Walks 20 ft in less than 5.5 sec, no assistive devices, good speed, no evidence for imbalance, normal gait pattern, deviates no more than 6 in outside of the 12 in walkway width.   5.4 seconds   Change in Gait Speed Able to smoothly change walking speed without loss of balance or gait deviation. Deviate no more than 6 in outside of the 12 in walkway width.    Gait with Horizontal Head Turns Performs head turns smoothly with no change in gait. Deviates no more than 6 in outside 12 in walkway width    Gait with Vertical Head Turns Performs head turns with no change in gait. Deviates no more than 6 in outside 12 in walkway width.    Gait and Pivot Turn Pivot turns safely within 3 sec and stops quickly with no loss of balance.    Step Over Obstacle Is able to step over 2 stacked shoe boxes taped together (9 in total height) without changing gait speed. No evidence of imbalance.    Gait with Narrow Base of Support Is able to ambulate for 10 steps heel to toe with no staggering.    Gait with Eyes Closed Walks 20 ft, no assistive devices, good speed, no evidence of imbalance, normal gait pattern, deviates no more than 6 in outside 12 in walkway width. Ambulates 20 ft in less than 7 sec.    Ambulating  Backwards Walks 20 ft, no assistive devices, good speed, no evidence for imbalance, normal gait   8.4 seconds   Steps Alternating feet, no rail.    Total Score 30    FGA comment: No Fall Risk             PHYSICAL THERAPY DISCHARGE SUMMARY  Visits from Start of Care: 9  Current functional level related to goals / functional outcomes: See LTGs/Clinical Assessment Statement.   Remaining deficits: Impaired high level balance.   Education / Equipment: HEP, fall prevention    Patient agrees to discharge. Patient goals were met. Patient is being discharged due to meeting the stated rehab goals.   GAIT: Gait pattern: WFL and step through pattern Distance walked: 500' outdoors on pavement Assistive device utilized: None Level of assistance: Complete Independence Comments: No LOB.     PATIENT EDUCATION: Education details: Continue with HEP, progress towards LTGs, D/C from PT.  Person educated: Patient Education method: Explanation, Demonstration, and Handouts Education comprehension: verbalized understanding   HOME EXERCISE PROGRAM: Access Code: [email or web address] URL: [email or web address] Date: 11/09/2022 Prepared by: Janann August  Exercises - Side Stepping with Resistance at Ankles and Counter Support  - 1 x daily - 5 x weekly - 1 sets - 5 reps - Forward Monster Walk with Resistance at Ankles and Counter Support  - 1 x daily - 5 x weekly - 1 sets - 5 reps - Tandem Walking with Counter Support  - 1 x daily - 5 x weekly - 1 sets - 5 reps - Semi-Tandem Corner Balance With Eyes Closed  - 1 x daily - 7 x weekly - 3 sets - 30s hold - Semi-Tandem Corner Balance: Eyes Open With Head Turns  - 1 x daily - 7 x  weekly - 3 sets - 30s hold - Romberg Stance on Foam Pad with Head Rotation  - 1 x daily - 5 x weekly - 2 sets - 10 reps  GOALS: Goals reviewed with patient? Yes  SHORT TERM GOALS: Target date: 10/31/2022  Pt will be independent with initial HEP in order to  build upon functional gains made in therapy. Baseline: Goal status: MET  2.  Pt will verbalize understanding of fall prevention in the home.  Baseline: handout provided on 10/30/22 Goal status: MET  3.  Pt will improve FGA to at least a 14/30 in order to demo decr fall risk. Baseline: 11/30; 24/30 on 10/30/22 Goal status: MET  4.  Pt will improve condition 3 of mCTSIB to at least 30 seconds in order to demo improved balance.  Baseline: 25 Sec; 30 seconds on 10/30/22 Goal status: MET  5.  Pt will improve gait speed with no AD vs. LRAD to at least 1.9 ft/sec in order to demo decr fall risk  Baseline: 20.37 seconds with no AD = 1.61 ft/sec; 2.79 ft/sec on 10/30/22  Goal status: MET    LONG TERM GOALS: Target date: 11/28/2022  Pt will be independent with final HEP in order to build upon functional gains made in therapy. Baseline:  Goal status: MET  2.  Pt will improve FGA to at least a 27/30 in order to demo decr fall risk. Baseline: 11/30; 24/30 on 10/30/22; 30/30 on 11/09/22 Goal status: MET  3.  Pt will improve gait speed with no AD vs. LRAD to at least 3.2 ft/sec in order to demo improved community mobility.  Baseline: 20.37 seconds with no AD = 1.61 ft/sec, 2.79 ft/sec on 10/30/22; 10.10 seconds = 3.24 ft/sec on 11/09/22 Goal status: MET  4.  Pt will improve condition 4 of mCTSIB to at least 15 seconds in order to demo improved vestibular input for balance.  Baseline: 5 Sec; 30 seconds on 10/30/22 Goal status: MET  5.  Pt will improve 5x sit<>stand to less than or equal to 16 sec to demonstrate improved functional strength and transfer efficiency.  Baseline: 21.31 seconds without UE support; 9.31 seconds with no UE support on 11/09/22 Goal status: MET  6.  Pt will ambulate at least 500' over paved surfaces outdoors with no AD vs. LRAD with supervision in order to demo improved community mobility.  Baseline:  Goal status: MET  ASSESSMENT:  CLINICAL IMPRESSION: Today's  skilled session focused on assessing pt's LTGs. Pt has met 6 out of 6 LTGs and has made excellent progress with PT. Pt improved gait speed with no AD to 3.24 ft/sec, indicating a community ambulator and scored a 30/30 on the FGA with no AD, indicating that pt is not at a fall risk. Pt is very pleased with her progress and will be discharged from PT at this time.     OBJECTIVE IMPAIRMENTS Abnormal gait, decreased activity tolerance, decreased balance, decreased coordination, decreased knowledge of use of DME, difficulty walking, decreased strength, postural dysfunction, and pain.   ACTIVITY LIMITATIONS carrying, bending, standing, stairs, transfers, and locomotion level  PARTICIPATION LIMITATIONS: shopping and community activity  PERSONAL FACTORS Age, Behavior pattern, Past/current experiences, Time since onset of injury/illness/exacerbation, and 3+ comorbidities: HTN, fibromyalgia, migraines, glaucoma.   are also affecting patient's functional outcome.   REHAB POTENTIAL: Good  CLINICAL DECISION MAKING: Evolving/moderate complexity  EVALUATION COMPLEXITY: Moderate  PLAN: PT FREQUENCY: 2x/week  PT DURATION: 8 weeks  PLANNED INTERVENTIONS: Therapeutic exercises, Therapeutic activity, Neuromuscular  re-education, Balance training, Gait training, Patient/Family education, Self Care, Stair training, Vestibular training, Canalith repositioning, DME instructions, and Re-evaluation  PLAN FOR NEXT SESSION:  D/C from PT    Arliss Journey, PT, DPT 11/09/2022, 10:52 AM

## 2022-11-13 ENCOUNTER — Ambulatory Visit: Payer: Medicare Other | Admitting: Physical Therapy

## 2022-11-14 ENCOUNTER — Ambulatory Visit: Payer: Medicare Other | Admitting: Neurology

## 2022-11-14 VITALS — BP 147/90 | HR 79 | Ht 62.25 in | Wt 170.2 lb

## 2022-11-14 DIAGNOSIS — Z8659 Personal history of other mental and behavioral disorders: Secondary | ICD-10-CM | POA: Diagnosis not present

## 2022-11-14 DIAGNOSIS — R0683 Snoring: Secondary | ICD-10-CM | POA: Diagnosis not present

## 2022-11-14 DIAGNOSIS — I63411 Cerebral infarction due to embolism of right middle cerebral artery: Secondary | ICD-10-CM

## 2022-11-14 DIAGNOSIS — G4719 Other hypersomnia: Secondary | ICD-10-CM | POA: Diagnosis not present

## 2022-11-14 DIAGNOSIS — F4310 Post-traumatic stress disorder, unspecified: Secondary | ICD-10-CM

## 2022-11-14 DIAGNOSIS — I639 Cerebral infarction, unspecified: Secondary | ICD-10-CM

## 2022-11-14 NOTE — Progress Notes (Signed)
SLEEP MEDICINE CLINIC    Provider:  Melvyn Novas, MD  Primary Care Physician:  Eartha Inch, MD [address] [place] [address]     Referring Provider: [name], Md [address] [place],  [place] [address]          Chief Complaint according to patient   Patient presents with:     New Patient (Initial Visit)           HISTORY OF PRESENT ILLNESS:  Robin Robles is a 66 year- old Caucasian female patient seen here as a referral on 11/14/2022 from Dr Lucia Gaskins  for a sleep apnea evaluation.  Chief concern according to patient :  I had a stroke and have seen Dr Lucia Gaskins,  with a question of sleep apnea. I snore loudly (  husband confirmed ) . I have had nightmares, night terrors in teenage".    I have the pleasure of seeing Robin Robles today, a right -handed Caucasian female with a possible sleep disorder.  She has a  has a past medical history of PTSD, CVA with balance problems ,  Hypertension, and Migraines.  ED : 09-22-2022: Robin Robles is a 66 y.o. female with medical history significant of hypertension, fibromyalgia, migraines, depression, GERD presented to the ED with complaints of right-sided headaches, photosensitivity, intermittent numbness/tingling of her upper lip and bilateral hands and gait instability.  She had an outpatient MRI done which was concerning for acute/subacute stroke and she was sent to the ED for further evaluation.   MRI of the brain done at [facility] on 09/20/2022 showing: "IMPRESSION:  1.  Tiny acute infarction in the posterior RIGHT temporal lobe and possibly another tiny acute/subacute infarction in the RIGHT frontal lobe.  2.  Mild leukoaraiosis, most likely due to chronic microvascular disease."   The patient had the first sleep study in the year 2010 with no apnea diagnosis., GMA , [facility].     Sleep relevant medical history: Nocturia once - but frequently in daytime,  Sleep walking, Night terrors, other  Parasomnia , left thyroidectomy for cold  nodes.  Family medical /sleep history: no  other family member on CPAP with OSA, insomnia, sleep walkers.    Social history:  Patient is retired since a fall, from retail and lives in a household with spouse,  1 dog and a Careers information officer.  Tobacco use: none .  ETOH use ; none ,  Caffeine intake in form of Coffee( 3 cups in AM ) Soda( some times PM ) Tea ( /) or energy drinks. Regular exercise in form of ;none.       Sleep habits are as follows: The patient's dinner time is between 5 PM. The patient goes to bed at 9 PM and continues to sleep for 5-6.5 hours,  cool quiet and dark, wakes for one bathroom break, often watches Tv in bed.  The preferred sleep position is lateral  and supine, not prone.  with the support of 1 pillow- plus knee pillow . Dreams are reportedly frequent/vivid.   7-8  AM is the usual rise time.  The patient wakes up spontaneously/ at 7-8.  She reports not feeling refreshed or restored in AM, with symptoms such as dry mouth, morning headaches, and residual fatigue.  Naps are taken infrequently, lasting from 90 to 120 minutes and are not interfering with  nocturnal sleep.    Review of Systems: Out of a complete 14 system review,  the patient complains of only the following symptoms, and all other reviewed systems are negative.:  Fatigue, sleepiness , snoring, fragmented sleep, chronic pain. Vivid dreams , Headaches.    How likely are you to doze in the following situations: 0 = not likely, 1 = slight chance, 2 = moderate chance, 3 = high chance   Sitting and Reading? Watching Television? Sitting inactive in a public place (theater or meeting)? As a passenger in a car for an hour without a break? Lying down in the afternoon when circumstances permit? Sitting and talking to someone? Sitting quietly after lunch without alcohol? In a car, while stopped for a few minutes in traffic?   Total = 16/ 24 points   FSS endorsed at 47/ 63  points.   Fibromyalgia.   Social History   Socioeconomic History   Marital status: Married    Spouse name: Not on file   Number of children: Not on file   Years of education: Not on file   Highest education level: Not on file  Occupational History   Not on file  Tobacco Use   Smoking status: Never   Smokeless tobacco: Never  Vaping Use   Vaping Use: Never used  Substance and Sexual Activity   Alcohol use: No   Drug use: No   Sexual activity: Not on file  Other Topics Concern   Not on file  Social History Narrative   Not on file   Social Determinants of Health   Financial Resource Strain: Not on file  Food Insecurity: No Food Insecurity (09/23/2022)   Hunger Vital Sign    Worried About Running Out of Food in the Last Year: Never true    Ran Out of Food in the Last Year: Never true  Transportation Needs: Unknown (09/23/2022)   PRAPARE - Hydrologist (Medical): Not on file    Lack of Transportation (Non-Medical): No  Physical Activity: Not on file  Stress: Not on file  Social Connections: Not on file    Family History  Problem Relation Age of Onset   Heart failure Mother    Migraines Father    Heart failure Father    Migraines Sister    Migraines Brother     Past Medical History:  Diagnosis Date   Fibromyalgia    Hypertension    Migraines     Past Surgical History:  Procedure Laterality Date   ABDOMINAL HYSTERECTOMY     CHOLECYSTECTOMY     THYROID SURGERY       Current Outpatient Medications on File Prior to Visit  Medication Sig Dispense Refill   aspirin EC 81 MG tablet Take 1 tablet (81 mg total) by mouth daily. Swallow whole. 150 tablet 2   buPROPion (WELLBUTRIN XL) 150 MG 24 hr tablet Take 150 mg by mouth daily.     EPINEPHrine 0.3 mg/0.3 mL IJ SOAJ injection Inject into the muscle.     Eptinezumab-jjmr (VYEPTI IV) Inject into the vein. One dose, 3-4 wks ago     Multiple Vitamins-Minerals (PRESERVISION AREDS PO) Take  1 tablet by mouth daily.     omeprazole (PRILOSEC) 20 MG capsule Take 20 mg by mouth daily as needed (for acid reflux).     pregabalin (LYRICA) 75 MG capsule Take 150 mg by mouth at bedtime.     propranolol (INDERAL) 40 MG tablet Take 40 mg by mouth every evening.     sertraline (ZOLOFT) 100 MG tablet Take 200 mg by  mouth every morning.      TIZANIDINE HCL PO Take by mouth as needed.     venlafaxine XR (EFFEXOR-XR) 37.5 MG 24 hr capsule Take 37.5 mg by mouth at bedtime. for migraines     rosuvastatin (CRESTOR) 40 MG tablet Take 1 tablet (40 mg total) by mouth daily. 30 tablet 0   No current facility-administered medications on file prior to visit.    Allergies  Allergen Reactions   Bee Venom Anaphylaxis    Throat swelling  Throat swelling    Codeine Other (See Comments)    hallucinations Other reaction(s): Delusions (intolerance), Hallucinations   Oxycodone-Acetaminophen Anxiety    Other reaction(s): Other, Other (See Comments) hyper hyper     Physical exam:  Today's Vitals   11/14/22 1110  BP: (!) 147/90  Pulse: 79  Weight: 170 lb 3.2 oz (77.2 kg)  Height: 5' 2.25" (1.581 m)   Body mass index is 30.88 kg/m.   Wt Readings from Last 3 Encounters:  11/14/22 170 lb 3.2 oz (77.2 kg)  09/27/22 168 lb 9.6 oz (76.5 kg)  09/23/22 169 lb 12.1 oz (77 kg)     Ht Readings from Last 3 Encounters:  11/14/22 5' 2.25" (1.581 m)  09/27/22 5' 2.25" (1.581 m)  09/23/22 5\' 2"  (1.575 m)      General: The patient is awake, alert and appears not in acute distress. The patient is well groomed. Head: Normocephalic, atraumatic. Neck is supple. Mallampati 2, large tonsils,  neck circumference:15. 5 inches . Nasal airflow patent.   Retrognathia is not seen.  Dental status:  smaller lower jaw, crowded.  Cardiovascular:  Regular rate and cardiac rhythm by pulse,  without distended neck veins. Respiratory: Lungs are clear to auscultation.  Skin:  Without evidence of ankle edema, or  rash. Trunk: The patient's posture is erect.   Neurologic exam : The patient is awake and alert, oriented to place and time.   Memory subjective described as intact.  Attention span & concentration ability appears normal.  Speech is fluent,  without  dysarthria, dysphonia or aphasia.  Mood and affect are appropriate.   Cranial nerves: no loss of smell or taste reported  Pupils are equal and briskly reactive to light. Funduscopic exam deferred. .  Extraocular movements in vertical and horizontal planes were intact and without nystagmus. No Diplopia. Visual fields by finger perimetry are intact. Hearing was intact to soft voice and finger rubbing.    Facial sensation intact to fine touch.  Facial motor strength is symmetric and tongue and uvula move midline.  Neck ROM : rotation, tilt and flexion extension were normal for age and shoulder shrug was symmetrical.    Motor exam:  Symmetric bulk, tone and ROM.   Normal tone without cog wheeling, symmetric grip strength .   Sensory:  Fine touch and vibration were tested  and  normal.  Proprioception tested in the upper extremities was normal.   Coordination: Rapid alternating movements in the fingers/hands were of normal speed.  The Finger-to-nose maneuver with dysmetria on the right- not left-  without evidence of ataxia or tremor.   Gait and station: Patient could rise unassisted from a seated position, walked without assistive device.  Stance is of normal width/ base and the patient turned with 3.5  steps.  Right foot slightly everted.  Toe walk intact. No drift.  Deep tendon reflexes: in the  upper and lower extremities are symmetric and intact.  Babinski response was deferred .  After spending a total time of  45  minutes face to face and additional time for physical and neurologic examination, review of laboratory studies,  personal review of imaging studies, reports and results of other testing and review of referral  information / records as far as provided in visit, I have established the following assessments:  1) acute  CVA , very recently and cardiac monitor  showed 2 runs of V tach , 4 and 5 beats in duration on cardiac monitor. Atrial fibrillation was mentioned.  2)  nocturnal palpitations.  3)  loud snoring- and EDS ( Epworth SS of 14-15 points, high fatigue score)  4) vivid dreams, nightly    My Plan is to proceed with:  1) screening for OSA - by HST or in lab PSG. I prefer PSG due to cardiac rhythm input and the parasomnia monitoring capability. Marland Kitchen  2) Follow up only if sleep apnea or another physiological sleep disorder is found.    I would like to thank Eartha Inch, MD and [name], Md [address] [place],  [place] [address] for allowing me to meet with and to take care of this pleasant patient.    I plan to follow up either personally or through our NP within 2-4 months.    Electronically signed by: Melvyn Novas, MD 11/14/2022 11:43 AM  [name] Board certified by The [employer] of Sleep Medicine and Diplomate of the [employer] of Sleep Medicine. Board certified In Neurology through the ABPN, Fellow of the [employer] of Neu[employer]. Medical Director of [facility].

## 2022-11-15 ENCOUNTER — Ambulatory Visit: Payer: Medicare Other | Admitting: Physical Therapy

## 2022-11-20 ENCOUNTER — Encounter: Payer: Self-pay | Admitting: Neurology

## 2022-11-20 ENCOUNTER — Telehealth: Payer: Self-pay | Admitting: Neurology

## 2022-11-20 ENCOUNTER — Ambulatory Visit: Payer: Medicare Other | Admitting: Physical Therapy

## 2022-11-20 ENCOUNTER — Other Ambulatory Visit: Payer: Self-pay | Admitting: Neurology

## 2022-11-20 MED ORDER — APIXABAN 5 MG PO TABS: 5.0000 mg | ORAL_TABLET | Freq: Two times a day (BID) | ORAL | 3 refills | Status: DC

## 2022-11-20 NOTE — Progress Notes (Signed)
Please see above

## 2022-11-20 NOTE — Telephone Encounter (Signed)
Cancel her appt tomorrow she is doing well and I started her on Eliquis for afib. Reschedule with me, can be video, in 6 months. Thanks (pod 4 fyi)

## 2022-11-20 NOTE — Telephone Encounter (Signed)
Scheduled VV for 05/28/23 at 2:00 pm.

## 2022-11-20 NOTE — Progress Notes (Unsigned)
   I reviewed her old patient extended EKG monitoring, she has had brief atrial tachycardia but I have included above for your review but not sustained atrial fibrillation.  Not sure this will qualify her for anticoagulation.  I do not see an appointment being made for her, do want me to see her as her telemetry is also markedly abnormal with recurrent episodes of brief NSVT.   Yates Decamp, MD, [facility] 11/20/2022, 6:09 PM Office: [phone] Fax: [phone] Pager: [phone]  [phone]

## 2022-11-21 ENCOUNTER — Institutional Professional Consult (permissible substitution): Payer: Medicare Other | Admitting: Neurology

## 2022-11-21 NOTE — Progress Notes (Signed)
I have asked my staff to schedule a visit

## 2022-11-23 ENCOUNTER — Ambulatory Visit: Payer: Medicare Other | Admitting: Physical Therapy

## 2022-11-27 ENCOUNTER — Ambulatory Visit: Payer: Medicare Other | Admitting: Physical Therapy

## 2022-11-27 ENCOUNTER — Ambulatory Visit: Payer: Medicare Other | Admitting: Internal Medicine

## 2022-11-27 ENCOUNTER — Encounter: Payer: Self-pay | Admitting: Internal Medicine

## 2022-11-27 ENCOUNTER — Telehealth: Payer: Self-pay | Admitting: *Deleted

## 2022-11-27 VITALS — BP 121/87 | HR 82 | Ht 62.5 in | Wt 170.6 lb

## 2022-11-27 DIAGNOSIS — I10 Essential (primary) hypertension: Secondary | ICD-10-CM

## 2022-11-27 DIAGNOSIS — E782 Mixed hyperlipidemia: Secondary | ICD-10-CM | POA: Insufficient documentation

## 2022-11-27 DIAGNOSIS — I4729 Other ventricular tachycardia: Secondary | ICD-10-CM

## 2022-11-27 MED ORDER — DILTIAZEM HCL 30 MG PO TABS: 30.0000 mg | ORAL_TABLET | Freq: Three times a day (TID) | ORAL | 6 refills | Status: AC | PRN

## 2022-11-27 NOTE — Progress Notes (Signed)
Primary Physician/Referring:  Eartha Inch, MD  Patient ID: Robin Robles, female    DOB: [birth year], 66 y.o.   MRN: [id number]  Chief Complaint  Patient presents with   Nonsustained ventricular tachycardia   New Patient (Initial Visit)   HPI:    Robin Robles  is a 66 y.o. female with past medical history significant for hypertension and migraines who is here to establish care with cardiology due to paroxysmal atrial fibrillation found on her event monitor.  For a couple of months patient thought she was just having very very bad migraine.  She eventually ended up getting MRI which showed a few strokes but the patient had no idea if she ever had strokes.  Subsequently a 2-week event monitor was ordered which did show some underlying atrial fibrillation burden.  Patient was instructed to stop taking aspirin and start Eliquis at that time however she has not yet done that due to pricing.  Patient admits that she has palpitations and irregular heart rates that are sometimes bothersome for her.  She says the propanolol works very well for her blood pressure but it does not really do anything for her palpitations.  Patient denies chest pain and dyspnea on exertion.  She denies diaphoresis, syncope, edema, orthopnea, claudication, PND.  Past Medical History:  Diagnosis Date   Fibromyalgia    Hypertension    Migraines    Past Surgical History:  Procedure Laterality Date   ABDOMINAL HYSTERECTOMY     CHOLECYSTECTOMY     sinus     THYROID SURGERY     Family History  Problem Relation Age of Onset   Heart failure Mother    Migraines Father    Heart failure Father    Migraines Sister    Migraines Brother     Social History   Tobacco Use   Smoking status: Never   Smokeless tobacco: Never  Substance Use Topics   Alcohol use: No   Marital Status: Married  ROS  Review of Systems  Cardiovascular:  Positive for irregular heartbeat and palpitations.   Objective  Blood pressure  121/87, pulse 82, height 5' 2.5" (1.588 m), weight 170 lb 9.6 oz (77.4 kg), SpO2 96 %. Body mass index is 30.71 kg/m.     11/27/2022   10:46 AM 11/14/2022   11:10 AM 10/19/2022    8:29 AM  Vitals with BMI  Height 5' 2.5" 5' 2.25"   Weight 170 lbs 10 oz 170 lbs 3 oz   BMI 30.69 30.89   Systolic 121 147 268  Diastolic 87 90 88  Pulse 82 79 81     Physical Exam Vitals reviewed.  HENT:     Head: Normocephalic and atraumatic.  Cardiovascular:     Rate and Rhythm: Normal rate and regular rhythm.     Pulses: Normal pulses.     Heart sounds: Normal heart sounds. No murmur heard. Pulmonary:     Effort: Pulmonary effort is normal.     Breath sounds: Normal breath sounds.  Abdominal:     General: Bowel sounds are normal.  Musculoskeletal:     Right lower leg: No edema.     Left lower leg: No edema.  Skin:    General: Skin is warm and dry.  Neurological:     Mental Status: She is alert.     Medications and allergies   Allergies  Allergen Reactions   Bee Venom Anaphylaxis    Throat swelling  Throat swelling  Codeine Other (See Comments)    hallucinations Other reaction(s): Delusions (intolerance), Hallucinations   Oxycodone-Acetaminophen Anxiety    Other reaction(s): Other, Other (See Comments) hyper hyper      Medication list after today's encounter   Current Outpatient Medications:    aspirin EC 81 MG tablet, Take 81 mg by mouth daily. Swallow whole., Disp: , Rfl:    buPROPion (WELLBUTRIN XL) 150 MG 24 hr tablet, Take 150 mg by mouth daily., Disp: , Rfl:    dicyclomine (BENTYL) 10 MG capsule, Take 10 mg by mouth 4 (four) times daily -  before meals and at bedtime., Disp: , Rfl:    diltiazem (CARDIZEM) 30 MG tablet, Take 1 tablet (30 mg total) by mouth 3 (three) times daily as needed., Disp: 90 tablet, Rfl: 6   EPINEPHrine 0.3 mg/0.3 mL IJ SOAJ injection, Inject into the muscle., Disp: , Rfl:    Eptinezumab-jjmr (VYEPTI IV), Inject into the vein. One dose, 3-4 wks  ago, Disp: , Rfl:    omeprazole (PRILOSEC) 20 MG capsule, Take 20 mg by mouth daily as needed (for acid reflux)., Disp: , Rfl:    pregabalin (LYRICA) 75 MG capsule, Take 150 mg by mouth at bedtime., Disp: , Rfl:    propranolol (INDERAL) 40 MG tablet, Take 40 mg by mouth every evening., Disp: , Rfl:    rosuvastatin (CRESTOR) 40 MG tablet, Take 1 tablet (40 mg total) by mouth daily., Disp: 30 tablet, Rfl: 0   sertraline (ZOLOFT) 100 MG tablet, Take 200 mg by mouth every morning. , Disp: , Rfl:    TIZANIDINE HCL PO, Take 4 mg by mouth as needed., Disp: , Rfl:    venlafaxine XR (EFFEXOR-XR) 37.5 MG 24 hr capsule, Take 37.5 mg by mouth at bedtime. for migraines, Disp: , Rfl:    apixaban (ELIQUIS) 5 MG TABS tablet, Take 1 tablet (5 mg total) by mouth 2 (two) times daily. (Patient not taking: Reported on 11/27/2022), Disp: 180 tablet, Rfl: 3   Multiple Vitamins-Minerals (PRESERVISION AREDS PO), Take 1 tablet by mouth daily., Disp: , Rfl:   Laboratory examination:   Lab Results  Component Value Date   NA 138 09/22/2022   K 4.1 09/22/2022   CO2 23 09/22/2022   GLUCOSE 98 09/22/2022   BUN 23 09/22/2022   CREATININE 0.70 09/22/2022   CALCIUM 9.0 09/22/2022   GFRNONAA >60 09/22/2022       Latest Ref Rng & Units 09/22/2022    6:01 PM 09/22/2022    5:27 PM 08/31/2022   10:54 PM  CMP  Glucose 70 - 99 mg/dL 98  409103  811122   BUN 8 - 23 mg/dL 23  20  22    Creatinine 0.44 - 1.00 mg/dL 9.140.70  7.820.77  9.561.05   Sodium 135 - 145 mmol/L 138  138  138   Potassium 3.5 - 5.1 mmol/L 4.1  4.2  4.2   Chloride 98 - 111 mmol/L 105  106  102   CO2 22 - 32 mmol/L  23  27   Calcium 8.9 - 10.3 mg/dL  9.0  9.0   Total Protein 6.5 - 8.1 g/dL  6.4    Total Bilirubin 0.3 - 1.2 mg/dL  0.4    Alkaline Phos 38 - 126 U/L  85    AST 15 - 41 U/L  23    ALT 0 - 44 U/L  19        Latest Ref Rng & Units 09/22/2022    6:01  PM 09/22/2022    5:27 PM 08/31/2022   10:54 PM  CBC  WBC 4.0 - 10.5 K/uL  7.4  7.6   Hemoglobin 12.0 - 15.0  g/dL 73.2  20.2  54.2   Hematocrit 36.0 - 46.0 % 45.0  43.0  43.7   Platelets 150 - 400 K/uL  225  216     Lipid Panel Recent Labs    09/23/22 0630  CHOL 247*  TRIG 145  LDLCALC 176*  VLDL 29  HDL 42  CHOLHDL 5.9    HEMOGLOBIN A1C Lab Results  Component Value Date   HGBA1C 5.4 09/23/2022   MPG 108.28 09/23/2022   TSH No results for input(s): "TSH" in the last 8760 hours.  External labs:     Radiology:   09/23/2022 CT HEAD/NECK IMPRESSION: 1. No emergent finding. 2. 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  Cardiac Studies:   10/07/2022 event monitor   Patient had a minimum heart rate of 57 bpm, maximum heart rate of 127 bpm, and average heart rate of 76 bpm.   Predominant underlying rhythm was sinus rhythm.   Two short episodes of NSVT (6 beats at longest).   Atrial fibrillation (47 seconds- rate controlled).   Isolated PACs were rare (<1.0%).   Isolated PVCs were rare (<1.0%).   Triggered and diary events associated with sinus rhythm.   IMPRESSIONS ECHO 09/23/2022  1. Left ventricular ejection fraction, by estimation, is 65 to 70%. The  left ventricle has normal function. The left ventricle has no regional  wall motion abnormalities. Left ventricular diastolic parameters are  consistent with Grade I diastolic  dysfunction (impaired relaxation).   2. Right ventricular systolic function was not well visualized. The right  ventricular size is not well visualized.   3. The mitral valve is grossly normal. Trivial mitral valve  regurgitation.   4. The aortic valve is tricuspid. Aortic valve regurgitation is not  visualized.   5. The inferior vena cava is normal in size with greater than 50%  respiratory variability, suggesting right atrial pressure of 3 mmHg.     EKG:   11/27/2022: Sinus Rhythm, HR 84 -Negative precordial T-waves.  Assessment     ICD-10-CM   1. Paroxysmal Atrial Fibrillation with history of stroke   I47.29 EKG 12-Lead    2. Essential hypertension  I10     3. Mixed hyperlipidemia  E78.2        Orders Placed This Encounter  Procedures   EKG 12-Lead    Meds ordered this encounter  Medications   diltiazem (CARDIZEM) 30 MG tablet    Sig: Take 1 tablet (30 mg total) by mouth 3 (three) times daily as needed.    Dispense:  90 tablet    Refill:  6    There are no discontinued medications.   Recommendations:   Robin Robles is a 66 y.o.  female with PAF  Paroxysmal Atrial Fibrillation with history of stroke CHA2DS2VASc of 5 Stop baby aspirin and start Eliquis 5mg  BID Neurologist also told pt to stop asa and take eliquis Eliquis currently very expensive - 1 month samples given Copay card and free month card also provided to pt Will investigate which OAC is most affordable with our pharmacist Cardizem 30mg  TID PRN sent for persistent palpitations   Essential hypertension Continue current cardiac medications. Encourage low-sodium diet, less than 2000 mg daily. Schedule imaging tests in office. Follow-up in 2-3 months or sooner if needed.   Mixed  hyperlipidemia Continue statin     Clotilde Dieter, DO, [facility]  11/27/2022, 2:22 PM Office: [phone] Pager: [phone]

## 2022-11-27 NOTE — Telephone Encounter (Signed)
-----   Message from [name], MD sent at 11/20/2022 11:47 AM EST ----- Afib was found on your heart monitor. This may necessitate Eliquis as a blood thinner. I will inform Dr, Jacinto Halim and get his opinion thank you

## 2022-11-27 NOTE — Telephone Encounter (Signed)
I called pt and let her know that the Cardiac event monitor results did show rare afib.  Eliquis may need to be started.  Pt said that she did see cardiology and has samples of eliquis.  She appreciated the call back.

## 2022-11-28 NOTE — Telephone Encounter (Signed)
From patient.

## 2022-11-29 ENCOUNTER — Other Ambulatory Visit: Payer: Self-pay | Admitting: Internal Medicine

## 2022-11-29 MED ORDER — EDOXABAN TOSYLATE 60 MG PO TABS: 60.0000 mg | ORAL_TABLET | Freq: Every day | ORAL | 6 refills | Status: DC

## 2022-11-29 MED ORDER — RIVAROXABAN 20 MG PO TABS: 20.0000 mg | ORAL_TABLET | Freq: Every day | ORAL | 6 refills | Status: DC

## 2022-11-29 NOTE — Telephone Encounter (Signed)
From patient.

## 2022-11-29 NOTE — Telephone Encounter (Signed)
Yup let me try one more otherwise I will just provide with samples

## 2022-11-29 NOTE — Telephone Encounter (Signed)
I just sent Xarelto (another blood thinner) so let me know how much this one costs. I have one more to try after this as well

## 2022-11-30 ENCOUNTER — Ambulatory Visit: Payer: Medicare Other | Admitting: Physical Therapy

## 2022-12-08 ENCOUNTER — Emergency Department (HOSPITAL_BASED_OUTPATIENT_CLINIC_OR_DEPARTMENT_OTHER)
Admission: EM | Admit: 2022-12-08 | Discharge: 2022-12-08 | Disposition: A | Discharge status: Home / Self Care | Payer: Medicare Other | Attending: Emergency Medicine | Admitting: Emergency Medicine

## 2022-12-08 ENCOUNTER — Emergency Department (HOSPITAL_BASED_OUTPATIENT_CLINIC_OR_DEPARTMENT_OTHER): Payer: Medicare Other

## 2022-12-08 ENCOUNTER — Other Ambulatory Visit: Payer: Self-pay

## 2022-12-08 ENCOUNTER — Encounter (HOSPITAL_BASED_OUTPATIENT_CLINIC_OR_DEPARTMENT_OTHER): Payer: Self-pay | Admitting: Emergency Medicine

## 2022-12-08 DIAGNOSIS — R519 Headache, unspecified: Secondary | ICD-10-CM | POA: Diagnosis present

## 2022-12-08 DIAGNOSIS — R202 Paresthesia of skin: Secondary | ICD-10-CM | POA: Diagnosis not present

## 2022-12-08 DIAGNOSIS — Z7982 Long term (current) use of aspirin: Secondary | ICD-10-CM | POA: Diagnosis not present

## 2022-12-08 DIAGNOSIS — Z7901 Long term (current) use of anticoagulants: Secondary | ICD-10-CM | POA: Insufficient documentation

## 2022-12-08 LAB — CBC WITH DIFFERENTIAL/PLATELET
Abs Immature Granulocytes: 0.02 10*3/uL (ref 0.00–0.07)
Basophils Absolute: 0 10*3/uL (ref 0.0–0.1)
Basophils Relative: 1 %
Eosinophils Absolute: 0.1 10*3/uL (ref 0.0–0.5)
Eosinophils Relative: 2 %
HCT: 42.8 % (ref 36.0–46.0)
Hemoglobin: 14.1 g/dL (ref 12.0–15.0)
Immature Granulocytes: 0 %
Lymphocytes Relative: 29 %
Lymphs Abs: 2 10*3/uL (ref 0.7–4.0)
MCH: 29.3 pg (ref 26.0–34.0)
MCHC: 32.9 g/dL (ref 30.0–36.0)
MCV: 88.8 fL (ref 80.0–100.0)
Monocytes Absolute: 0.6 10*3/uL (ref 0.1–1.0)
Monocytes Relative: 9 %
Neutro Abs: 4 10*3/uL (ref 1.7–7.7)
Neutrophils Relative %: 59 %
Platelets: 230 10*3/uL (ref 150–400)
RBC: 4.82 MIL/uL (ref 3.87–5.11)
RDW: 13.1 % (ref 11.5–15.5)
WBC: 6.8 10*3/uL (ref 4.0–10.5)
nRBC: 0 % (ref 0.0–0.2)

## 2022-12-08 LAB — COMPREHENSIVE METABOLIC PANEL
ALT: 17 U/L (ref 0–44)
AST: 17 U/L (ref 15–41)
Albumin: 4.1 g/dL (ref 3.5–5.0)
Alkaline Phosphatase: 80 U/L (ref 38–126)
Anion gap: 10 (ref 5–15)
BUN: 14 mg/dL (ref 8–23)
CO2: 25 mmol/L (ref 22–32)
Calcium: 9 mg/dL (ref 8.9–10.3)
Chloride: 105 mmol/L (ref 98–111)
Creatinine, Ser: 0.69 mg/dL (ref 0.44–1.00)
GFR, Estimated: >60 mL/min (ref >60)
Glucose, Bld: 93 mg/dL (ref 70–99)
Potassium: 3.8 mmol/L (ref 3.5–5.1)
Sodium: 140 mmol/L (ref 135–145)
Total Bilirubin: 0.3 mg/dL (ref 0.3–1.2)
Total Protein: 6.5 g/dL (ref 6.5–8.1)

## 2022-12-08 MED ORDER — LACTATED RINGERS IV BOLUS
1000.0000 mL | Freq: Once | INTRAVENOUS | Status: AC
  Administered 2022-12-08: 1000 mL via INTRAVENOUS

## 2022-12-08 MED ORDER — METOCLOPRAMIDE HCL 5 MG/ML IJ SOLN
10.0000 mg | Freq: Once | INTRAMUSCULAR | Status: AC
  Administered 2022-12-08: 10 mg via INTRAVENOUS
  Filled 2022-12-08: qty 2

## 2022-12-08 MED ORDER — DIPHENHYDRAMINE HCL 50 MG/ML IJ SOLN
12.5000 mg | Freq: Once | INTRAMUSCULAR | Status: AC
  Administered 2022-12-08: 12.5 mg via INTRAVENOUS
  Filled 2022-12-08: qty 1

## 2022-12-08 NOTE — ED Triage Notes (Signed)
Pt presents to ED Pov. Pt c/o HA, HTN, tingling in finger tips that began ~1h ago. Pt reports that she has hx of migraine and TIA. Pt also c/o photosensitivity. Pt takes eliquis.

## 2022-12-08 NOTE — Discharge Instructions (Signed)
Your testing today shows evidence of a subacute small stroke to your right temporal brain.  This was discussed with neurology who recommends continuing her Xarelto and following up in the clinic next week with Dr. Lucia Gaskins.  Return to the ED sooner with increased headache, focal weakness, numbness, tingling, difficulty speaking or difficulty swallowing or other concerns.

## 2022-12-08 NOTE — ED Provider Notes (Signed)
Care assumed from Dr. Criss Alvine.  Patient here with migraine headache, confusion, tingling to left fingertips and toes.  CT head is negative.  She is on Xarelto for history of atrial fibrillation.  Previous CVA.  CT head is negative.  MRI: IMPRESSION: Small focus of increased signal on diffusion-weighted imaging in the right superior temporal lobe without an associated correlate on the ADC map. This may represent a small subacute infarct. No hemorrhage.  D/w Dr. Iver Nestle of neurology who reviewed patient's images.  She suspects the stroke is likely nonacute and very small.  Patient has not missed any doses of her Xarelto.  Dr. Iver Nestle feels it is safe for her to continue Xarelto at this time and follow-up with her outpatient neurologist.  No indication for inpatient workup today.  On recheck, patient's headache has resolved and she is feeling improved.  No focal deficits.  She is requesting discharge home.  Follow-up with her neurologist Dr. Lucia Gaskins next week.  Return precautions discussed     Glynn Octave, MD 12/08/22 1711

## 2022-12-08 NOTE — ED Notes (Signed)
Patient transported to MRI 

## 2022-12-08 NOTE — ED Provider Notes (Signed)
[facility] EMERGENCY DEPT Provider Note   CSN: [id number] Arrival date & time: 12/08/22  1321     History  Chief Complaint  Patient presents with   Headache    Robin Robles is a 66 y.o. female.  HPI 66 year old female with a history of migraine headaches, paroxysmal A-fib, CVA presents with headache and fingertip numbness.  She states around noon she started feeling poorly.  Feels like she is confused and having trouble doing typical daily chores.  Is also having a moderate right-sided frontal headache.  No fever or neck stiffness.  No visual complaints besides photophobia.  She gets migraines fairly often.  She is also noticing bilateral upper lip tingling as well as tingling to her left distal fingertips and left distal toes.  No weakness appreciated.  She states that she had an MRI that showed a stroke when she did not realize she had stroke and so she is concerned she might be having another stroke today.  Of note, upon talking to patient she does tell me that a lot of the symptoms do feel like when she had more frequent migraine headaches besides the tingling.  Otherwise the location of the headache, the confusion like feeling, photophobia, are all similar to when she has had migraines.  Home Medications Prior to Admission medications   Medication Sig Start Date End Date Taking? Authorizing Provider  aspirin EC 81 MG tablet Take 81 mg by mouth daily. Swallow whole.    [provider]  buPROPion (WELLBUTRIN XL) 150 MG 24 hr tablet Take 150 mg by mouth daily. 05/07/20   [provider]  dicyclomine (BENTYL) 10 MG capsule Take 10 mg by mouth 4 (four) times daily -  before meals and at bedtime.    [provider]  diltiazem (CARDIZEM) 30 MG tablet Take 1 tablet (30 mg total) by mouth 3 (three) times daily as needed. 11/27/22   Custovic, Rozell Searing, DO  EPINEPHrine 0.3 mg/0.3 mL IJ SOAJ injection Inject into the muscle. 06/06/17   [provider]  Eptinezumab-jjmr (VYEPTI IV) Inject into the vein. One dose, 3-4 wks ago    [provider]  Multiple Vitamins-Minerals (PRESERVISION AREDS PO) Take 1 tablet by mouth daily.    [provider]  omeprazole (PRILOSEC) 20 MG capsule Take 20 mg by mouth daily as needed (for acid reflux).    [provider]  pregabalin (LYRICA) 75 MG capsule Take 150 mg by mouth at bedtime.    [provider]  propranolol (INDERAL) 40 MG tablet Take 40 mg by mouth every evening.    [provider]  rivaroxaban (XARELTO) 20 MG TABS tablet Take 20 mg by mouth daily with supper.    [provider]  rosuvastatin (CRESTOR) 40 MG tablet Take 1 tablet (40 mg total) by mouth daily. 09/23/22 11/27/22  Hughie Closs, MD  sertraline (ZOLOFT) 100 MG tablet Take 200 mg by mouth every morning.     [provider]  TIZANIDINE HCL PO Take 4 mg by mouth as needed.    [provider]  venlafaxine XR (EFFEXOR-XR) 37.5 MG 24 hr capsule Take 37.5 mg by mouth at bedtime. for migraines    [provider]      Allergies    Bee venom, Codeine, and Oxycodone-acetaminophen    Review of Systems   Review of Systems  Constitutional:  Negative for fever.  Eyes:  Positive for photophobia. Negative for visual disturbance.  Gastrointestinal:  Negative for vomiting.  Musculoskeletal:  Negative for neck pain.  Neurological:  Positive for headaches. Negative for weakness.    Physical Exam Updated Vital Signs BP (!) 135/117   Pulse 71   Temp 97.6 F (36.4 C)   Resp 17   SpO2 95%  Physical Exam Vitals and nursing note reviewed.  Constitutional:      Appearance: She is well-developed.  HENT:     Head: Normocephalic and atraumatic.  Cardiovascular:     Rate and Rhythm: Normal rate and regular rhythm.     Heart sounds: Normal heart sounds.  Pulmonary:     Effort: Pulmonary effort is normal.     Breath sounds: Normal breath sounds.   Abdominal:     Palpations: Abdomen is soft.     Tenderness: There is no abdominal tenderness.  Musculoskeletal:     Cervical back: Normal range of motion and neck supple. No rigidity.  Skin:    General: Skin is warm and dry.  Neurological:     Mental Status: She is alert.     Comments: CN 3-12 grossly intact. 5/5 strength in all 4 extremities. Grossly normal sensation. Normal finger to nose.      ED Results / Procedures / Treatments   Labs (all labs ordered are listed, but only abnormal results are displayed) Labs Reviewed  COMPREHENSIVE METABOLIC PANEL  CBC WITH DIFFERENTIAL/PLATELET    EKG EKG Interpretation  Date/Time:  Friday December 08 2022 13:31:52 EST Ventricular Rate:  78 PR Interval:  152 QRS Duration: 78 QT Interval:  372 QTC Calculation: 424 R Axis:   99 Text Interpretation: Normal sinus rhythm Rightward axis Low voltage QRS no acute ST/T changes similar to Sept 2023 Confirmed by Sherwood Gambler [id number]) on 12/08/2022 3:10:30 PM  Radiology CT Head Wo Contrast  Result Date: 12/08/2022 CLINICAL DATA:  Headache.  History of stroke. EXAM: CT HEAD WITHOUT CONTRAST TECHNIQUE: Contiguous axial images were obtained from the base of the skull through the vertex without intravenous contrast. RADIATION DOSE REDUCTION: This exam was performed according to the departmental dose-optimization program which includes automated exposure control, adjustment of the mA and/or kV according to patient size and/or use of iterative reconstruction technique. COMPARISON:  CT head dated September 23, 2022. FINDINGS: Brain: No evidence of acute infarction, hemorrhage, hydrocephalus, extra-axial collection or mass lesion/mass effect. Vascular: Calcified atherosclerosis at the skull base. No hyperdense vessel. Skull: Normal. Negative for fracture or focal lesion. Sinuses/Orbits: No acute finding. Other: None. IMPRESSION: 1. No acute intracranial abnormality. Electronically Signed   By: Titus Dubin M.D.   On: 12/08/2022 14:00    Procedures Procedures    Medications Ordered in ED Medications  lactated ringers bolus 1,000 mL (1,000 mLs Intravenous New Bag/Given 12/08/22 1454)  metoCLOPramide (REGLAN) injection 10 mg (10 mg Intravenous Given 12/08/22 1446)  diphenhydrAMINE (BENADRYL) injection 12.5 mg (12.5 mg Intravenous Given 12/08/22 1446)    ED Course/ Medical Decision Making/ A&P Clinical Course as of 12/08/22 1516  Fri Dec 08, 2022  1340 Patient is reporting some tingling in her left fingertips and left toes as well as tingling to her bilateral upper lip.  No weakness or numbness appreciated on exam.  Will get a head CT because of her being on Eliquis.  She is in the code stroke window but is not a thrombolytic candidate obviously, does not appear to have an LVO, and this does not seem consistent with stroke based on paresthesias to the fingertips rather than a large area.  Will not call  code stroke for now. Will treat her headache. [SG]    Clinical Course User Index [SG] Sherwood Gambler, MD                           Medical Decision Making Amount and/or Complexity of Data Reviewed Labs: ordered. Radiology: ordered and independent interpretation performed.    Details: No head bleed  Risk Prescription drug management.   Patient presents primarily with headache but also some tingling.  As above I do not think this is an acute stroke.  Likely migraine versus complicated migraine.  Highly doubt CNS infection.  Discussed with patient, will check labs, give IV medicine for headache and reevaluate. Care transferred to Dr. Wyvonnia Dusky.        Final Clinical Impression(s) / ED Diagnoses Final diagnoses:  None    Rx / DC Orders ED Discharge Orders     None         Sherwood Gambler, MD 12/08/22 [id number]

## 2022-12-11 ENCOUNTER — Encounter: Payer: Self-pay | Admitting: Internal Medicine

## 2022-12-13 NOTE — Telephone Encounter (Signed)
She can take 1 or upto 3 in a day, whateveer makes her feel best and controls BP

## 2022-12-13 NOTE — Telephone Encounter (Signed)
From patient.

## 2022-12-15 ENCOUNTER — Telehealth: Payer: Self-pay | Admitting: Neurology

## 2022-12-15 ENCOUNTER — Ambulatory Visit: Payer: Medicare Other | Admitting: Neurology

## 2022-12-15 ENCOUNTER — Encounter: Payer: Self-pay | Admitting: Neurology

## 2022-12-15 VITALS — BP 131/84 | HR 76 | Ht 62.0 in | Wt 170.0 lb

## 2022-12-15 DIAGNOSIS — G43109 Migraine with aura, not intractable, without status migrainosus: Secondary | ICD-10-CM | POA: Diagnosis not present

## 2022-12-15 DIAGNOSIS — G43711 Chronic migraine without aura, intractable, with status migrainosus: Secondary | ICD-10-CM

## 2022-12-15 MED ORDER — NURTEC 75 MG PO TBDP: 75.0000 mg | ORAL_TABLET | Freq: Every day | ORAL | 11 refills | Status: DC | PRN

## 2022-12-15 NOTE — Telephone Encounter (Signed)
Patient transitioning from [facility] for migraines. She gets Vyepti; She is on the vyepti 100mg  she gets it at [facility] on [address], last one was December 8th. We will need to transfer this to 05-02-2001 and increase to 300 please start the process. G43.711

## 2022-12-15 NOTE — Telephone Encounter (Signed)
On vyepti she is doing great, Now she has 5 migraine days a month and < 10 total headache days a month. Failed multiple triptans although due to stroke triptans contraindicated. Her copay was $1600 can we call her or [employer](I sent script to [facility]) and see if we can try to help with patient assistance program thanks

## 2022-12-15 NOTE — Progress Notes (Signed)
[name]    Provider:  Dr Jaynee Eagles Requesting Provider: [name], MD Primary Care Provider:  Chesley Noon, MD  CC:  Hospital follow up   Follow up 12/15/2022: Patient is here for follow up. We diagnosed her with afib likely as the cause of her stroke and started her on blood thinners. She saw our sleep doctor and had a sleep study I don;t see results yet(also saw afib on sleep study).  He has a history of migraines seeing Ashok Pall who is now retiring.  So I believe she was originally here for migraines but she was seen in the emergency room December 08, 2022 for migraine headache, confusion, tingling to left fingertips and toes, CT of the head was negative, she was already on Xarelto for previous history of A-fib, and previous CVA, MRI showed maybe a small focus of increased signal on diffusion-weighted imaging in the right superior temporal lobe without an associated correlate on ADC map which may represent a small subacute infarct but no hemorrhage, neurology reviewed it and suspected the stroke likely nonacute and very small, she did not miss any doses of her Xarelto, and she was sent for outpatient follow-up.  Patient's migraine improved.  I reviewed Robin Robles's notes, she is on Relpax, Vyepti, type tizanidine, Roselyn Meier, she was on Aimovig which was ineffective, she declined to be treated with Botox, she has tried multiple other medications such as Maxalt, baclofen,  She is on Eliquis. She tried xarelto because eliquis was pricey but HAS been on eliquis. 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 Prior to vyepti had daily migraines, pulsating/pounding/throbbing/photophobia, sounds and smells would trigger, nausea, used to vomiting, would last 24 hours and she had them daily for > 1 year. On the vyepti she is doing better, usually on the right side behind the eye and  moderate to severe and can radiate to the back of the neck, she has had the vyepti twice only at 100mg  and she is 80% better and no side effects. For the migraine breakthroughs she cannot take triptan due to stroke. Now she has 5 migraine days a month and < 10 total headache days a month. No other focal neurologic deficits, associated symptoms, inciting events or modifiable factors.  Patient complains of symptoms per HPI as well as the following symptoms: migraines . Pertinent negatives and positives per HPI. All others negative   Reviewed laconic Robles's notes on medications patient is tried in the past:  Current and past medications: ANALGESICS:Tylenol ANTI-MIGRAINE:Imitrex (tachycardia), Maxalt (ineffective), Ubrelvy ($$$), Nurtec ($$$), relpax HEART/BP: Inderal DECONGESTANT/ANTIHISTAMINE: ANTI-NAUSEANT: NSAIDS: MUSCLE RELAXANTS:tizanidine, baclofen (no help)  ANTI-CONVULSANTS: Lyrica, Gabapentin, Topamax (cog. SE), Trokendi XR  STEROIDS: SLEEPING PILLS/TRANQUILIZERS: ANTI-DEPRESSANTS: Zoloft, Cymbalta, wellbutrin HERBAL:  FIBROMYALGIA:  HORMONAL: OTHER:Aimovig (ineffective)  PROCEDURES FOR HEADACHES:   HPI 09/27/2022:  Robin Robles is a 66 y.o. female here as requested by [name], MD for hospital follow up.  She has a past medical history of hypertension, chronic migraine, depression admitted to for positive stroke in September of this year.  I reviewed notes from Dr. Rigoberto Noel inpatient, chronic migraines since 66 years old, started on Topamax in the past, she noticed intermittent confusion word finding difficulty at work, she follows with [name] [facility] and eventually was told to stop Topamax, she has tried many medications including a.m. of a which have not been effective,  eventually an MRI with and without contrast on September 20, 2022 reported tiny acute infarct in the posterior right temporal lobe and possibly another tiny acute/subacute infarct in the right frontal lobe and  she was sent to the hospital.  LDL was high and she was not on any anticholesterol medication CTA of the head neck was found without significant finding.   She was admitted in September with a headache. She is here with her husband who provides information. Sometimes she feels like her heart stopped, she saw Dr. Einar Gip and she has an extra beat and it has to catch up. She has had some hypotension but this would not account the 2 punctate strokes (that would be more watershed). She is on DUAP right now, was not on aspirin before. Currently on Plavix and ASA and when 3 weeks is over ASA 81mg  indefinitely. LDL 176 need to get it down to < 70, placed on lipitor and follow with primary care for adjustments. She has had a sleep test 15-16 years ago. She snores heavily per husband who provides much information, she is tried all the time, no energy, fatigued, wakes up with morning headaches; recommend a sleep study.No other focal neurologic deficits, associated symptoms, inciting events or modifiable factors.  Reviewed notes, labs and imaging from outside physicians, which showed:  MRI 09/20/2022: IMPRESSION:  [facility] (asked for them to load onto canopy for me) 1.  Tiny acute infarction in the posterior RIGHT temporal lobe and possibly another tiny acute/subacute infarction in the RIGHT frontal lobe.  2.  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    09/23/2022: CTA H&N: FINDINGS: CT HEAD FINDINGS   Brain: No evidence of acute infarction, hemorrhage, hydrocephalus, extra-axial collection or mass lesion/mass effect.   Vascular: Negative   Skull: Normal. Negative for fracture or focal lesion.   Sinuses/Orbits: No acute finding.   Review of the MIP images confirms the above findings   CTA NECK FINDINGS   Aortic arch: Atheromatous calcification.  Three vessel branching   Right carotid system: Vessels are smooth and diffusely patent. No significant calcification.   Left  carotid system: Vessels are smooth and diffusely patent. Low-density plaque at the ICA bulb causes 40% narrowing. No ulceration.   Vertebral arteries: No proximal subclavian stenosis. Left dominant vertebral artery. Both vertebral arteries are smoothly contoured and widely patent to the dura.   Skeleton: No acute finding   Other neck: Absent left lobe thyroid.   Upper chest: Clear apical lungs   Review of the MIP images confirms the above findings   CTA HEAD FINDINGS   Anterior circulation: Atheromatous calcification affecting the carotid siphons. No branch occlusion, beading, or aneurysm.   Posterior circulation: Vertebral and basilar arteries are smoothly contoured and widely patent. No branch occlusion, beading, or aneurysm.   Venous sinuses: Unremarkable   Anatomic variants: None significant   Review of the MIP images confirms the above findings   IMPRESSION: 1. No emergent finding. 2. 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    Reviewed Dorena Cookey Robles's notes:  Ashok Pall 07/26/2022: Assessment and Plan:  1. Chronic migraine without aura without status migrainosus, not intractable (Primary) - eptinezumab-jjmr (VYEPTI) 100 mg/mL injection; Inject 1 mL (100 mg dose) into the vein every 3 (three) months., Starting Wed 07/26/2022, Print - eletriptan (RELPAX) 40 MG tablet; Take one tablet (40 mg dose) by mouth 2 (two) times a day as needed. may take second dose after 2 hours if necessary.  Max 2 tablets/24 hours, Starting Thu 08/03/2022, Normal  Discontnue Aimovig (ineffective) She declines to be treated with Botox Begin Vyepti 100 mg IV every three months [facility])  Discontinue Maxalt (ineffective) Discontinue baclofen (ineffective) Begin Ubrelvy 100 mg as needed Begin Tizanidine 4 mg as needed  Headache diary HPI This is a chronic problem. Episode onset: Started at age 16. The problem occurs intermittently and has been  waxing and waning. The pain is located in the right unilateral and retro-orbital regions. The quality of the pain is described as band-like, throbbing, stabbing and sharp. The pain is severe. Associated symptoms include dizziness, ear pain, eye pain, numbness, phonophobia, photophobia and tinnitus. Pertinent negatives include no nausea or vomiting. The symptoms are aggravated by emotional stress and bright light. They are worse with movement.  The headaches will last four hours to several days. There is a family history of headache. There is no described aura associated with the headache.   Currently, headache pain occurs 22 days out of the month. (25 headache days per month at last OV). The headaches can last up to 3 days.  Headaches have not improved. She feels as though they may be more intense and longer in duration.   To treat the headache pain abortively, she is using Maxalt 10 mg as needed. She is using this medication about 1-2 times a week. It is not helpful to relieve symptoms. Baclofen 10 mg has not been helpful for her.   She is injecting Aimovig 140 mg sq monthly. She denies side effects. It has not been helpful to reduce the frequency and intensity of migraine symptoms.   Current and past medications: ANALGESICS:Tylenol ANTI-MIGRAINE:Imitrex (tachycardia), Maxalt (ineffective), Ubrelvy ($$$), Nurtec ($$$), relpax HEART/BP: Inderal DECONGESTANT/ANTIHISTAMINE: ANTI-NAUSEANT: NSAIDS: MUSCLE RELAXANTS:tizanidine, baclofen (no help)  ANTI-CONVULSANTS: Lyrica, Gabapentin, Topamax (cog. SE), Trokendi XR  STEROIDS: SLEEPING PILLS/TRANQUILIZERS: ANTI-DEPRESSANTS: Zoloft, Cymbalta, wellbutrin HERBAL:  FIBROMYALGIA:  HORMONAL: OTHER:Aimovig (ineffective)  PROCEDURES FOR HEADACHES:   Review of Systems: Patient complains of symptoms per HPI as well as the following symptoms chronic migraines. Pertinent negatives and positives per HPI. All others negative.   Social History    Socioeconomic History   Marital status: Married    Spouse name: Not on file   Number of children: Not on file   Years of education: Not on file   Highest education level: Not on file  Occupational History   Not on file  Tobacco Use   Smoking status: Never   Smokeless tobacco: Never  Vaping Use   Vaping Use: Never used  Substance and Sexual Activity   Alcohol use: No   Drug use: No   Sexual activity: Not on file  Other Topics Concern   Not on file  Social History Narrative   Not on file   Social Determinants of Health   Financial Resource Strain: Not on file  Food Insecurity: No Food Insecurity (09/23/2022)   Hunger Vital Sign    Worried About Running Out of Food in the Last Year: Never true    Ran Out of Food in the Last Year: Never true  Transportation Needs: Unknown (09/23/2022)   PRAPARE - Hydrologist (Medical): Not on file    Lack of Transportation (Non-Medical): No  Physical Activity: Not on file  Stress: Not on file  Social Connections: Not on file  Intimate Partner Violence: Not At Risk (09/23/2022)   Humiliation, Afraid, Rape, and Kick questionnaire    Fear of Current or Ex-Partner:  No    Emotionally Abused: No    Physically Abused: No    Sexually Abused: No    Family History  Problem Relation Age of Onset   Heart failure Mother    Migraines Father    Heart failure Father    Migraines Sister    Migraines Brother     Past Medical History:  Diagnosis Date   Fibromyalgia    Hypertension    Migraines     Patient Active Problem List   Diagnosis Date Noted   Mixed hyperlipidemia 11/27/2022   Paroxysmal Atrial Fibrillation 11/27/2022   History of night terrors 11/14/2022   Snoring 11/14/2022   Excessive daytime sleepiness 11/14/2022   PTSD (post-traumatic stress disorder) 11/14/2022   Acute CVA (cerebrovascular accident) ([place]) 09/23/2022   Essential hypertension 09/23/2022   Depression 09/23/2022   Upper airway  cough syndrome 07/31/2017    Past Surgical History:  Procedure Laterality Date   ABDOMINAL HYSTERECTOMY     CHOLECYSTECTOMY     sinus     THYROID SURGERY      Current Outpatient Medications  Medication Sig Dispense Refill   apixaban (ELIQUIS) 5 MG TABS tablet Take by mouth.     buPROPion (WELLBUTRIN XL) 150 MG 24 hr tablet Take 150 mg by mouth daily.     dicyclomine (BENTYL) 10 MG capsule Take 10 mg by mouth 4 (four) times daily -  before meals and at bedtime.     diltiazem (CARDIZEM) 30 MG tablet Take 1 tablet (30 mg total) by mouth 3 (three) times daily as needed. 90 tablet 6   EPINEPHrine 0.3 mg/0.3 mL IJ SOAJ injection Inject into the muscle.     Eptinezumab-jjmr (VYEPTI IV) Inject into the vein. One dose, 3-4 wks ago     Multiple Vitamins-Minerals (PRESERVISION AREDS PO) Take 1 tablet by mouth daily.     omeprazole (PRILOSEC) 20 MG capsule Take 20 mg by mouth daily as needed (for acid reflux).     pregabalin (LYRICA) 75 MG capsule Take 150 mg by mouth at bedtime.     propranolol (INDERAL) 40 MG tablet Take 40 mg by mouth every evening.     Rimegepant Sulfate (NURTEC) 75 MG TBDP Take 75 mg by mouth daily as needed. For migraines. Take as close to onset of migraine as possible. One daily maximum. 16 tablet 11   sertraline (ZOLOFT) 100 MG tablet Take 200 mg by mouth every morning.      TIZANIDINE HCL PO Take 4 mg by mouth as needed.     venlafaxine XR (EFFEXOR-XR) 37.5 MG 24 hr capsule Take 37.5 mg by mouth at bedtime. for migraines     No current facility-administered medications for this visit.    Allergies as of 12/15/2022 - Review Complete 12/15/2022  Allergen Reaction Noted   Bee venom Anaphylaxis 11/20/2016   Codeine Other (See Comments) 09/20/2013   Oxycodone-acetaminophen Anxiety 12/13/2016    Vitals: BP 131/84   Pulse 76   Ht 5\' 2"  (1.575 m)   Wt 170 lb (77.1 kg)   BMI 31.09 kg/m  Last Weight:  Wt Readings from Last 1 Encounters:  12/15/22 170 lb (77.1 kg)    Last Height:   Ht Readings from Last 1 Encounters:  12/15/22 5\' 2"  (1.575 m)     Physical exam: Exam: Gen: NAD, conversant, well nourised, obese, well groomed                     CV: RRR, no  MRG. No Carotid Bruits. No peripheral edema, warm, nontender Eyes: Conjunctivae clear without exudates or hemorrhage  Neuro: Detailed Neurologic Exam  Speech:    Speech is normal; fluent and spontaneous with normal comprehension.  Cognition:    The patient is oriented to person, place, and time;     recent and remote memory intact;     language fluent;     normal attention, concentration,     fund of knowledge Cranial Nerves:    The pupils are equal, round, and reactive to light. The fundi are normal and spontaneous venous pulsations are present. Visual fields are full to finger confrontation. Extraocular movements are intact. Trigeminal sensation is intact and the muscles of mastication are normal. The face is symmetric. The palate elevates in the midline. Hearing intact. Voice is normal. Shoulder shrug is normal. The tongue has normal motion without fasciculations.   Coordination:    Normal finger to nose and heel to shin. Normal rapid alternating movements.   Gait:    Heel-toe and tandem gait are normal.   Motor Observation:    No asymmetry, no atrophy, and no involuntary movements noted. Tone:    Normal muscle tone.    Posture:    Posture is normal. normal erect    Strength:    Strength is V/V in the upper and lower limbs.      Sensation: intact to LT     Reflex Exam:  DTR's:    Deep tendon reflexes in the upper and lower extremities are normal bilaterally.   Toes:    The toes are downgoing bilaterally.   Clonus:    Clonus is absent.    Assessment/Plan:  66 y.o. female here as requested by Rosalin Hawking, MD for hospital follow up.  She has a past medical history of hypertension, chronic migraine, depression admitted to for positive stroke in September of this year.   MRI was 09/20/2022 and reported tiny acute infarcts in the posterior right temporal lobe and possibly another tiny acute/subacute infarct in the right frontal lobe.  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  LDL was 176 and A1c was 5.4.  afib found.  Continue eqliquis Continue Lipitor to get LDL < 70 Manage blood pressure, no suggestion of diabetes (hgba1c 5.4): Continue Vyepti at 300mg  - we will start the transfer over Prescribed Nurtec will see if we can help with patient assistance: Can be used everyother day as prevention. OR can be used as needed and can even take it daily. Can take it in advance of migraine because it lasts so long.  Awaiting sleep study results Transition Vyepti increase to 100mg  for mihraines Triptans contraindicated Discussed: "There is increased risk for stroke in women with migraine with aura and a contraindication for the combined contraceptive pill for use by women who have migraine with aura. The risk for women with migraine without aura is lower. However other risk factors like smoking are far more likely to increase stroke risk than migraine. There is a recommendation for no smoking and for the use of OCPs without estrogen such as progestogen only pills particularly for women with migraine with aura.Marland Kitchen People who have migraine headaches with auras may be 3 times more likely to have a stroke caused by a blood clot, compared to migraine patients who don't see auras. Women who take hormone-replacement therapy may be 30 percent more likely to suffer a clot-based stroke than  women not taking medication containing estrogen. Other risk factors like smoking and high blood pressure may be  much more important."  No orders of the defined types were placed in this encounter.  Meds ordered this encounter  Medications   Rimegepant Sulfate (NURTEC) 75 MG TBDP    Sig: Take 75 mg by  mouth daily as needed. For migraines. Take as close to onset of migraine as possible. One daily maximum.    Dispense:  16 tablet    Refill:  11    Now she has 5 migraine days a month and < 10 total headache days a month.Patient may need copay/financial assistance program. She has failed maxalt, imitrex, ubrelvy, relpax.Hx stroke,triptans contraindicated    Cc: Glynn Octave, MD,  Eartha Inch, MD  Naomie Dean, MD  [facility] [address] [place], [place] [address]  I spent over 70 minutes of face-to-face and non-face-to-face time with patient on the  1. Chronic migraine without aura, with intractable migraine, so stated, with status migrainosus   2. Migraine with aura and without status migrainosus, not intractable    diagnosis.  This included previsit chart review, lab review, study review, order entry, electronic health record documentation, patient education on the different diagnostic and therapeutic options, counseling and coordination of care, risks and benefits of management, compliance, or risk factor reduction   Phone [phone] Fax [phone]

## 2022-12-15 NOTE — Patient Instructions (Addendum)
Continue Vyepti at 300mg  - we will start the transfer over Prescribed Nurtec will see if we can help with patient assistance: Can be used everyother day as prevention. OR can be used as needed and can even take it daily. Can take it in advance of migraine because it lasts so long.  Awaiting sleep study results   "There is increased risk for stroke in women with migraine with aura and a contraindication for the combined contraceptive pill for use by women who have migraine with aura. The risk for women with migraine without aura is lower. However other risk factors like smoking are far more likely to increase stroke risk than migraine. There is a recommendation for no smoking and for the use of OCPs without estrogen such as progestogen only pills particularly for women with migraine with aura. People who have migraine headaches with auras may be 3 times more likely to have a stroke caused by a blood clot, compared to migraine patients who don't see auras. Women who take hormone-replacement therapy may be 30 percent more likely to suffer a clot-based stroke than women not taking medication containing estrogen. Other risk factors like smoking and high blood pressure may be  much more important."  Rimegepant Disintegrating Tablets What is this medication? RIMEGEPANT (ri ME je pant) prevents and treats migraines. It works by blocking a substance in the body that causes migraines. This medicine may be used for other purposes; ask your health care provider or pharmacist if you have questions. COMMON BRAND NAME(S): NURTEC ODT What should I tell my care team before I take this medication? They need to know if you have any of these conditions: Kidney disease Liver disease An unusual or allergic reaction to rimegepant, other medications, foods, dyes, or preservatives Pregnant or trying to get pregnant Breast-feeding How should I use this medication? Take this medication by mouth. Take it as directed on the  prescription label. Leave the tablet in the sealed pack until you are ready to take it. With dry hands, open the pack and gently remove the tablet. If the tablet breaks or crumbles, throw it away. Use a new tablet. Place the tablet in the mouth and allow it to dissolve. Then, swallow it. Do not cut, crush, or chew this medication. You do not need water to take this medication. Talk to your care team about the use of this medication in children. Special care may be needed. Overdosage: If you think you have taken too much of this medicine contact a poison control center or emergency room at once. NOTE: This medicine is only for you. Do not share this medicine with others. What if I miss a dose? This does not apply. This medication is not for regular use. What may interact with this medication? Certain medications for fungal infections, such as fluconazole, itraconazole Rifampin This list may not describe all possible interactions. Give your health care provider a list of all the medicines, herbs, non-prescription drugs, or dietary supplements you use. Also tell them if you smoke, drink alcohol, or use illegal drugs. Some items may interact with your medicine. What should I watch for while using this medication? Visit your care team for regular checks on your progress. Tell your care team if your symptoms do not start to get better or if they get worse. What side effects may I notice from receiving this medication? Side effects that you should report to your care team as soon as possible: Allergic reactions--skin rash, itching, hives, swelling of  the face, lips, tongue, or throat Side effects that usually do not require medical attention (report to your care team if they continue or are bothersome): Nausea Stomach pain This list may not describe all possible side effects. Call your doctor for medical advice about side effects. You may report side effects to FDA at 1-800-FDA-1088. Where should I keep  my medication? Keep out of the reach of children and pets. Store at room temperature between 20 and 25 degrees C (68 and 77 degrees F). Get rid of any unused medication after the expiration date. To get rid of medications that are no longer needed or have expired: Take the medication to a medication take-back program. Check with your pharmacy or law enforcement to find a location. If you cannot return the medication, check the label or package insert to see if the medication should be thrown out in the garbage or flushed down the toilet. If you are not sure, ask your care team. If it is safe to put it in the trash, take the medication out of the container. Mix the medication with cat litter, dirt, coffee grounds, or other unwanted substance. Seal the mixture in a bag or container. Put it in the trash. NOTE: This sheet is a summary. It may not cover all possible information. If you have questions about this medicine, talk to your doctor, pharmacist, or health care provider.  2023 Elsevier/Gold Standard (2021-12-29 00:00:00) Eptinezumab Injection What is this medication? EPTINEZUMAB (EP ti NEZ ue mab) prevents migraines. It works by blocking a substance in the body that causes migraines. It is a monoclonal antibody. This medicine may be used for other purposes; ask your health care provider or pharmacist if you have questions. COMMON BRAND NAME(S): Vyepti What should I tell my care team before I take this medication? They need to know if you have any of these conditions: An unusual or allergic reaction to eptinezumab, other medications, foods, dyes, or preservatives Pregnant or trying to get pregnant Breast-feeding How should I use this medication? This medication is injected into a vein. It is given by your care team in a hospital or clinic setting. Talk to your care team about the use of this medication in children. Special care may be needed. Overdosage: If you think you have taken too much of  this medicine contact a poison control center or emergency room at once. NOTE: This medicine is only for you. Do not share this medicine with others. What if I miss a dose? Keep appointments for follow-up doses. It is important not to miss your dose. Call your care team if you are unable to keep an appointment. What may interact with this medication? Interactions are not expected. This list may not describe all possible interactions. Give your health care provider a list of all the medicines, herbs, non-prescription drugs, or dietary supplements you use. Also tell them if you smoke, drink alcohol, or use illegal drugs. Some items may interact with your medicine. What should I watch for while using this medication? Your condition will be monitored carefully while you are receiving this medication. What side effects may I notice from receiving this medication? Side effects that you should report to your care team as soon as possible: Allergic reactions or angioedema--skin rash, itching or hives, swelling of the face, eyes, lips, tongue, arms, or legs, trouble swallowing or breathing Side effects that usually do not require medical attention (report to your care team if they continue or are bothersome): Runny or stuffy  nose This list may not describe all possible side effects. Call your doctor for medical advice about side effects. You may report side effects to FDA at 1-800-FDA-1088. Where should I keep my medication? This medication is given in a hospital or clinic. It will not be stored at home. NOTE: This sheet is a summary. It may not cover all possible information. If you have questions about this medicine, talk to your doctor, pharmacist, or health care provider.  2023 Elsevier/Gold Standard (2022-01-30 00:00:00)

## 2022-12-19 ENCOUNTER — Encounter: Payer: Self-pay | Admitting: Neurology

## 2022-12-21 NOTE — Telephone Encounter (Signed)
Patient responded through [employer] and would like to transfer infusion sites to [facility]. Order is ready for signature.

## 2022-12-21 NOTE — Telephone Encounter (Signed)
I spoke with [facility] pharmacy. Right now pt's co-pay is saying $0 but they have to see if the patient's insurance is in network with them. A PA has been initiated and sent to [facility] for [place] to do. They will reach out to the patient about the next steps after PA is done.

## 2022-12-21 NOTE — Telephone Encounter (Signed)
Order has been signed by Dr Pearlean Brownie and given to [facility].   Vyepti 300 mg IV every 3 months x 1 year.

## 2022-12-26 ENCOUNTER — Encounter: Payer: Self-pay | Admitting: Internal Medicine

## 2022-12-27 ENCOUNTER — Telehealth: Payer: Self-pay | Admitting: *Deleted

## 2022-12-27 NOTE — Telephone Encounter (Addendum)
Completed Nurtec PA on CMM. Key: [id number] PA Case ID #: [id number]. Awaiting determination from [facility].

## 2022-12-27 NOTE — Telephone Encounter (Signed)
Approved today Request Reference Number: [id number]. NURTEC TAB 75MG  ODT is approved through 12/25/2023. Your patient may now fill this prescription and it will be covered.

## 2022-12-28 ENCOUNTER — Encounter: Payer: Self-pay | Admitting: Internal Medicine

## 2022-12-28 ENCOUNTER — Telehealth: Payer: Self-pay | Admitting: Neurology

## 2022-12-28 NOTE — Telephone Encounter (Signed)
NPSG- [employer] medicare no auth req   Patient is scheduled at [facility] for 01/01/23 at 9 pm.  Patient is aware to eat her evening meal before arrive, to bring any medication she takes before bed, if she sleeps with a special pillow to bring it and to bring comfortable clothes to sleep in.

## 2022-12-28 NOTE — Telephone Encounter (Signed)
From patient.

## 2023-01-01 ENCOUNTER — Ambulatory Visit (INDEPENDENT_AMBULATORY_CARE_PROVIDER_SITE_OTHER): Payer: Medicare Other | Admitting: Neurology

## 2023-01-01 DIAGNOSIS — I63411 Cerebral infarction due to embolism of right middle cerebral artery: Secondary | ICD-10-CM

## 2023-01-01 DIAGNOSIS — G478 Other sleep disorders: Secondary | ICD-10-CM

## 2023-01-01 DIAGNOSIS — G4733 Obstructive sleep apnea (adult) (pediatric): Secondary | ICD-10-CM | POA: Diagnosis not present

## 2023-01-01 DIAGNOSIS — G4720 Circadian rhythm sleep disorder, unspecified type: Secondary | ICD-10-CM

## 2023-01-01 DIAGNOSIS — F4310 Post-traumatic stress disorder, unspecified: Secondary | ICD-10-CM

## 2023-01-01 DIAGNOSIS — I639 Cerebral infarction, unspecified: Secondary | ICD-10-CM

## 2023-01-01 DIAGNOSIS — Z8659 Personal history of other mental and behavioral disorders: Secondary | ICD-10-CM

## 2023-01-01 DIAGNOSIS — R0683 Snoring: Secondary | ICD-10-CM

## 2023-01-01 DIAGNOSIS — G4719 Other hypersomnia: Secondary | ICD-10-CM

## 2023-01-05 NOTE — Procedures (Unsigned)
Physician Interpretation (Study was interpreted on behalf of Dr. Brett Fairy)   Referred by: Dr. Sarina Ill  History: 67 year old female with an underlying medical history of chronic migraine, stroke, hyperlipidemia, fibromyalgia, paroxysmal A-fib, hypertension, and mild obesity, who reports snoring and excessive daytime somnolence.  Her Epworth sleepiness score is 16 out of 24.  EEG: Review of the EEG showed no abnormal electrical discharges and symmetrical bihemispheric findings.    EKG: The EKG revealed normal sinus rhythm (NSR).   AUDIO/VIDEO REVIEW: The audio and video review did not show any abnormal or unusual behaviors, movements, phonations or vocalizations with the exception of brief episodes of sleep talking noted during REM sleep (epochs *** ). The patient took 1 restroom break.  Intermittent mild snoring was detected.  POST-STUDY QUESTIONNAIRE: Post study, the patient indicated, that sleep was the same as usual. The patient stated in the comment section that the acquisition sleep technologist, " Rodman Key is very nice, professional and let me know what to expect".  IMPRESSION:   Mild Obstructive sleep apnea (OSA) Dysfunctions associated with sleep stages or arousal from sleep Sleep talking (during REM sleep)  RECOMMENDATIONS:   This study demonstrates overall mild obstructive sleep apnea, more pronounced during REM sleep with a total AHI of 12.3/h, REM AHI 29/h, supine AHI 59.4/h, O2 nadir 83%. Given the patient's medical history and sleep related complaints, treatment with positive airway pressure is recommended; this can be achieved in the form of autoPAP. Alternatively, a full-night CPAP titration study would allow optimization of therapy if needed. Other treatment options may include avoidance of supine sleep position along with weight loss, or the use of an oral appliance in selected patients. Please note, that untreated obstructive sleep apnea may carry additional perioperative  morbidity. Patients with significant obstructive sleep apnea should receive perioperative PAP therapy and the surgeons and particularly the anesthesiologist should be informed of the diagnosis and the severity of the sleep disordered breathing. This study shows no significant sleep fragmentation, but mildly abnormal sleep stage percentages; these are nonspecific findings and per se do not signify an intrinsic sleep disorder or a cause for the patient's sleep-related symptoms. Causes include (but are not limited to) the first night effect of the sleep study, circadian rhythm disturbances, medication effect or an underlying mood disorder or medical problem.  The patient should be cautioned not to drive, work at heights, or operate dangerous or heavy equipment when tired or sleepy. Review and reiteration of good sleep hygiene measures should be pursued with any patient. The patient and her referring provider will be notified of the test results.   I certify that I have reviewed the entire raw data recording prior to the issuance of this report in accordance with the Standards of Accreditation of the American Academy of Sleep Medicine (AASM).  Star Age, MD, PhD Medical Director, [facility]) [place], ABPN (Neurology and Sleep)   Technical Report:   ***

## 2023-01-08 ENCOUNTER — Telehealth: Payer: Self-pay | Admitting: Neurology

## 2023-01-08 DIAGNOSIS — G4733 Obstructive sleep apnea (adult) (pediatric): Secondary | ICD-10-CM

## 2023-01-08 NOTE — Telephone Encounter (Signed)
This patient saw Dr. Brett Fairy for sleep evaluation on 11/14/2022.  I read the PSG from 01/01/2023 on Dr. Edwena Felty behalf:   Please call and notify the patient that the recent sleep study showed overall mild obstructive sleep apnea, worse during dream sleep and when she sleeps on her back.  She had a couple of occurrences of brief sleep talking.  I would recommend starting treatment at home with an AutoPap machine.  I would be happy to order her machine on behalf of Dr. Brett Fairy.  Let me know if she would like to start treatment for sleep apnea with an AutoPap machine which is a CPAP like machine.  We can send the order to a local DME company of her choice.   Star Age, MD, PhD [name])

## 2023-01-09 ENCOUNTER — Other Ambulatory Visit: Payer: Self-pay | Admitting: *Deleted

## 2023-01-09 NOTE — Addendum Note (Signed)
Addended by: Star Age on: 01/09/2023 05:41 PM   Modules accepted: Orders

## 2023-01-09 NOTE — Telephone Encounter (Signed)
Order placed on behalf of Dr. Brett Fairy for autoPAP therapy.

## 2023-01-09 NOTE — Telephone Encounter (Signed)
I spoke with the patient and informed her of the results of her sleep study. She verbalized understanding of the findings and expressed appreciation for the call. She would like to start on AutoPap. She prefers [employer] for the DME company. She will contact [employer] to find out the cost of the machine, then call our office back once she would like the order to be placed.

## 2023-01-09 NOTE — Progress Notes (Signed)
error 

## 2023-01-11 NOTE — Telephone Encounter (Signed)
Prescription and SS results faxed to [place]. Received a receipt of confirmation.

## 2023-01-13 ENCOUNTER — Encounter: Payer: Self-pay | Admitting: Neurology

## 2023-01-15 NOTE — Telephone Encounter (Signed)
Spoke with [facility] w/ [facility]. She confirmed this is the approval with [employer] and [facility]:     She provided this number for patient to call and address billing questions as its possible the $1103 they quoted her MIGHT be for the year which would include 4 infusions (at least at the current ordered dose that was approved): [phone]. I messaged the patient back in [place].

## 2023-01-25 NOTE — Telephone Encounter (Signed)
Vyepti 300 mg (@ [name]) order ready for Dr Cathren Laine signature. The fax number is [phone].

## 2023-01-25 NOTE — Telephone Encounter (Signed)
Vyepti 300 mg IV q3 months order signed by Dr Jaynee Eagles and faxed to [facility] at [phone].

## 2023-01-31 ENCOUNTER — Telehealth: Payer: Self-pay | Admitting: Neurology

## 2023-01-31 NOTE — Telephone Encounter (Signed)
Last office note and message showing increase of vyepti to 300mg  every 3 months.  Fax confirmation received [phone].

## 2023-01-31 NOTE — Telephone Encounter (Signed)
Robin Robles @ [facility]  is asking for medical documentation re: the change from 100mg -300mg  so they can submit this on to insurance.  Their [phone]

## 2023-02-19 ENCOUNTER — Telehealth: Payer: Self-pay | Admitting: Neurology

## 2023-02-19 NOTE — Telephone Encounter (Addendum)
Returned [employer]. She states with pt's medicare advantage plan it essentially doubles the price of Vyepti from $300 to $1000. Estill Bamberg states the patient told her the 100 mg works great for her and she would like to stay on that. Pt was to call us and let us know as well. I was transferred to [facility], a pharmacist, and gave v.o. per Dr Jaynee Eagles for patient to remain on Vyepti 100 mg IV every 3 months instead of increasing to 300 mg. He verbalized understand and will process order.   MAR updated.

## 2023-02-19 NOTE — Telephone Encounter (Signed)
Robin Robles/ [name] stated pt can't afford 300 mg infusion but pt wants to stay 100 mg infusion because she can afford it. Stated you can fax new orders over at [phone] or call for a verbal request.

## 2023-02-26 ENCOUNTER — Ambulatory Visit: Payer: Medicare Other | Admitting: Internal Medicine

## 2023-02-26 ENCOUNTER — Encounter: Payer: Self-pay | Admitting: Internal Medicine

## 2023-02-26 VITALS — BP 133/72 | HR 71 | Resp 16 | Ht 62.0 in | Wt 173.6 lb

## 2023-02-26 DIAGNOSIS — E782 Mixed hyperlipidemia: Secondary | ICD-10-CM

## 2023-02-26 DIAGNOSIS — I10 Essential (primary) hypertension: Secondary | ICD-10-CM

## 2023-02-26 DIAGNOSIS — I4729 Other ventricular tachycardia: Secondary | ICD-10-CM

## 2023-02-26 NOTE — Progress Notes (Signed)
Primary Physician/Referring:  Chesley Noon, MD  Patient ID: Robin Robles, female    DOB: 1956/01/25, 67 y.o.   MRN: [id number]  Chief Complaint  Patient presents with   Paroxysmal Atrial Fibrillation with history of stroke   Follow-up    3 months   HPI:    Robin Robles  is a 67 y.o. female with past medical history significant for hypertension and migraines who is here for a follow-up visit. She has been doing well since the last time she was here. No complaints or concerns today. She is tolerating Adeline without bleeding issues. She is not having anymore palpitations or irregular heart rates. Patient denies chest pain and dyspnea on exertion.  She denies diaphoresis, syncope, edema, orthopnea, claudication, PND.  Past Medical History:  Diagnosis Date   Fibromyalgia    Hypertension    Migraines    Past Surgical History:  Procedure Laterality Date   ABDOMINAL HYSTERECTOMY     CHOLECYSTECTOMY     sinus     THYROID SURGERY     Family History  Problem Relation Age of Onset   Heart failure Mother    Migraines Father    Heart failure Father    Migraines Sister    Migraines Brother     Social History   Tobacco Use   Smoking status: Never   Smokeless tobacco: Never  Substance Use Topics   Alcohol use: No   Marital Status: Married  ROS  Review of Systems  Cardiovascular:  Negative for irregular heartbeat and palpitations.   Objective  Blood pressure 133/72, pulse 71, resp. rate 16, height '5\' 2"'$  (1.575 m), weight 173 lb 9.6 oz (78.7 kg), SpO2 94 %. Body mass index is 31.75 kg/m.     02/26/2023    9:56 AM 12/15/2022    9:58 AM 12/08/2022    5:30 PM  Vitals with BMI  Height '5\' 2"'$  '5\' 2"'$    Weight 173 lbs 10 oz 170 lbs   BMI [id number] 0000000   Systolic [id number]  Diastolic 72 84 76  Pulse 71 76 85     Physical Exam Vitals reviewed.  HENT:     Head: Normocephalic and atraumatic.  Cardiovascular:     Rate and Rhythm: Normal rate and regular rhythm.      Pulses: Normal pulses.     Heart sounds: Normal heart sounds. No murmur heard. Pulmonary:     Effort: Pulmonary effort is normal.     Breath sounds: Normal breath sounds.  Abdominal:     General: Bowel sounds are normal.  Musculoskeletal:     Right lower leg: No edema.     Left lower leg: No edema.  Skin:    General: Skin is warm and dry.  Neurological:     Mental Status: She is alert.     Medications and allergies   Allergies  Allergen Reactions   Bee Venom Anaphylaxis    Throat swelling  Throat swelling    Codeine Other (See Comments)    hallucinations Other reaction(s): Delusions (intolerance), Hallucinations   Oxycodone-Acetaminophen Anxiety    Other reaction(s): Other, Other (See Comments) hyper hyper      Medication list after today's encounter   Current Outpatient Medications:    apixaban (ELIQUIS) 5 MG TABS tablet, Take by mouth., Disp: , Rfl:    buPROPion (WELLBUTRIN XL) 150 MG 24 hr tablet, Take 150 mg by mouth daily., Disp: , Rfl:    dicyclomine (BENTYL) 10 MG  capsule, Take 10 mg by mouth 4 (four) times daily -  before meals and at bedtime., Disp: , Rfl:    diltiazem (CARDIZEM) 30 MG tablet, Take 1 tablet (30 mg total) by mouth 3 (three) times daily as needed., Disp: 90 tablet, Rfl: 6   EPINEPHrine 0.3 mg/0.3 mL IJ SOAJ injection, Inject into the muscle., Disp: , Rfl:    Eptinezumab-jjmr (VYEPTI IV), Inject 100 mg into the vein every 3 (three) months. One dose, 3-4 wks ago, Disp: , Rfl:    fluticasone (FLONASE) 50 MCG/ACT nasal spray, Place 1 spray into both nostrils daily., Disp: , Rfl:    Multiple Vitamins-Minerals (PRESERVISION AREDS PO), Take 1 tablet by mouth daily., Disp: , Rfl:    omeprazole (PRILOSEC) 20 MG capsule, Take 20 mg by mouth daily as needed (for acid reflux)., Disp: , Rfl:    pregabalin (LYRICA) 75 MG capsule, Take 150 mg by mouth at bedtime., Disp: , Rfl:    propranolol (INDERAL) 40 MG tablet, Take 40 mg by mouth every evening., Disp: ,  Rfl:    Rimegepant Sulfate (NURTEC) 75 MG TBDP, Take 75 mg by mouth daily as needed. For migraines. Take as close to onset of migraine as possible. One daily maximum., Disp: 16 tablet, Rfl: 11   rosuvastatin (CRESTOR) 40 MG tablet, Take 40 mg by mouth daily., Disp: , Rfl:    sertraline (ZOLOFT) 100 MG tablet, Take 200 mg by mouth every morning. , Disp: , Rfl:    venlafaxine XR (EFFEXOR-XR) 37.5 MG 24 hr capsule, Take 37.5 mg by mouth at bedtime. for migraines, Disp: , Rfl:   Laboratory examination:   Lab Results  Component Value Date   NA 140 12/08/2022   K 3.8 12/08/2022   CO2 25 12/08/2022   GLUCOSE 93 12/08/2022   BUN 14 12/08/2022   CREATININE 0.69 12/08/2022   CALCIUM 9.0 12/08/2022   GFRNONAA >60 12/08/2022       Latest Ref Rng & Units 12/08/2022    2:50 PM 09/22/2022    6:01 PM 09/22/2022    5:27 PM  CMP  Glucose 70 - 99 mg/dL 93  98  103   BUN 8 - 23 mg/dL '14  23  20   '$ Creatinine 0.44 - 1.00 mg/dL 0.69  0.70  0.77   Sodium 135 - 145 mmol/L 140  138  138   Potassium 3.5 - 5.1 mmol/L 3.8  4.1  4.2   Chloride 98 - 111 mmol/L 105  105  106   CO2 22 - 32 mmol/L 25   23   Calcium 8.9 - 10.3 mg/dL 9.0   9.0   Total Protein 6.5 - 8.1 g/dL 6.5   6.4   Total Bilirubin 0.3 - 1.2 mg/dL 0.3   0.4   Alkaline Phos 38 - 126 U/L 80   85   AST 15 - 41 U/L 17   23   ALT 0 - 44 U/L 17   19       Latest Ref Rng & Units 12/08/2022    2:50 PM 09/22/2022    6:01 PM 09/22/2022    5:27 PM  CBC  WBC 4.0 - 10.5 K/uL 6.8   7.4   Hemoglobin 12.0 - 15.0 g/dL 14.1  15.3  14.3   Hematocrit 36.0 - 46.0 % 42.8  45.0  43.0   Platelets 150 - 400 K/uL 230   225     Lipid Panel Recent Labs    09/23/22 0630  CHOL 247*  TRIG 145  LDLCALC 176*  VLDL 29  HDL 42  CHOLHDL 5.9    HEMOGLOBIN A1C Lab Results  Component Value Date   HGBA1C 5.4 09/23/2022   MPG 108.28 09/23/2022   TSH No results for input(s): "TSH" in the last 8760 hours.  External labs:     Radiology:   09/23/2022 CT  HEAD/NECK IMPRESSION: 1. No emergent finding. 2. 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  Cardiac Studies:   10/07/2022 event monitor   Patient had a minimum heart rate of 57 bpm, maximum heart rate of 127 bpm, and average heart rate of 76 bpm.   Predominant underlying rhythm was sinus rhythm.   Two short episodes of NSVT (6 beats at longest).   Atrial fibrillation (47 seconds- rate controlled).   Isolated PACs were rare (<1.0%).   Isolated PVCs were rare (<1.0%).   Triggered and diary events associated with sinus rhythm.   IMPRESSIONS ECHO 09/23/2022  1. Left ventricular ejection fraction, by estimation, is 65 to 70%. The  left ventricle has normal function. The left ventricle has no regional  wall motion abnormalities. Left ventricular diastolic parameters are  consistent with Grade I diastolic  dysfunction (impaired relaxation).   2. Right ventricular systolic function was not well visualized. The right  ventricular size is not well visualized.   3. The mitral valve is grossly normal. Trivial mitral valve  regurgitation.   4. The aortic valve is tricuspid. Aortic valve regurgitation is not  visualized.   5. The inferior vena cava is normal in size with greater than 50%  respiratory variability, suggesting right atrial pressure of 3 mmHg.     EKG:   11/27/2022: Sinus Rhythm, HR 84 -Negative precordial T-waves.  Assessment     ICD-10-CM   1. Paroxysmal Atrial Fibrillation with history of stroke  I47.29     2. Essential hypertension  I10     3. Mixed hyperlipidemia  E78.2        No orders of the defined types were placed in this encounter.   No orders of the defined types were placed in this encounter.   Medications Discontinued During This Encounter  Medication Reason   TIZANIDINE HCL PO      Recommendations:   Robin Robles is a 67 y.o.  female with PAF  Paroxysmal Atrial Fibrillation with history of  stroke CHA2DS2VASc of 5 Continue Eliquis '5mg'$  BID Eliquis and Xarelto samples provided as OAC is not afforable Continue Cardizem '30mg'$  TID PRN    Essential hypertension Continue current cardiac medications. Encourage low-sodium diet, less than 2000 mg daily. BP is very well controlled on current regiment Follow-up in 6 months or sooner if needed.   Mixed hyperlipidemia Continue statin     Robin Flock, DO, [facility]  02/26/2023, 10:39 AM Office: [phone] Pager: [phone]

## 2023-03-05 NOTE — Telephone Encounter (Signed)
Received fax from [facility]:  [phone], or [phone] fax [phone].    03-02-2023 appointment for Vyepti infusion for pt '100mg'$  administered with complications.  Clinic account # required for results [id number].  Next appointment 06-04-2023 at 1030 am.

## 2023-03-08 ENCOUNTER — Encounter: Payer: Self-pay | Admitting: Neurology

## 2023-03-15 NOTE — Progress Notes (Signed)
[name] [address] [place]. [place] [address] [phone]       OFFICE FOLLOW UP NOTE  [name] Date of Birth:  08-08-1956 Medical Record Number:  [id number]   Reason for visit: Initial CPAP follow-up    SUBJECTIVE:   CHIEF COMPLAINT:  Chief Complaint  Patient presents with   Follow-up    Patient in room #3 with her husband. Patient states she has trouble with her mask for her CPAP machine.   Follow up visit:  Prior visit: 11/14/2022 with Dr Brett Fairy (initial visit)  Brief HPI:   Robin Robles is a 67 y.o. female who is being seen today for initial CPAP compliance visit accompanied by her husband.  Was initially seen by Dr. Brett Fairy for concern of underlying sleep apnea.  Completed sleep study 01/01/2023 which showed overall mild OSA with total AHI of 9.6/h and more pronounced during REM sleep with REM AHI 23.4/h, supine AHI 49.3/h and O2 nadir of 83%.  Recommend initiation of AutoPap.  Set up date 01/12/2023.   She complains of difficulty tolerating CPAP nasal mask, feels like its cutting into her face and will have leaks. She tried to obtain a new interface but has had difficulty obtaining through [employer]. She is otherwise tolerating well. Notes improvement of sleep quality and daytime energy levels. Also notes improvement of migraine headaches.  Epworth Sleepiness Scale 8/24 (prior to CPAP 16/24).  Fatigue severity scale 40/64 (prior to CPAP 47/63).           ROS:   14 system review of systems performed and negative with exception of those listed in HPI  PMH:  Past Medical History:  Diagnosis Date   Fibromyalgia    Hypertension    Migraines     PSH:  Past Surgical History:  Procedure Laterality Date   ABDOMINAL HYSTERECTOMY     CHOLECYSTECTOMY     sinus     THYROID SURGERY      Social History:  Social History   Socioeconomic History   Marital status: Married    Spouse name: Not on file   Number of  children: 2   Years of education: Not on file   Highest education level: Not on file  Occupational History   Not on file  Tobacco Use   Smoking status: Never   Smokeless tobacco: Never  Vaping Use   Vaping Use: Never used  Substance and Sexual Activity   Alcohol use: No   Drug use: No   Sexual activity: Not on file  Other Topics Concern   Not on file  Social History Narrative   Not on file   Social Determinants of Health   Financial Resource Strain: Not on file  Food Insecurity: No Food Insecurity (09/23/2022)   Hunger Vital Sign    Worried About Running Out of Food in the Last Year: Never true    Ran Out of Food in the Last Year: Never true  Transportation Needs: Unknown (09/23/2022)   PRAPARE - Hydrologist (Medical): Not on file    Lack of Transportation (Non-Medical): No  Physical Activity: Not on file  Stress: Not on file  Social Connections: Not on file  Intimate Partner Violence: Not At Risk (09/23/2022)   Humiliation, Afraid, Rape, and Kick questionnaire    Fear of Current or Ex-Partner: No    Emotionally Abused: No    Physically Abused: No    Sexually Abused: No  Family History:  Family History  Problem Relation Age of Onset   Heart failure Mother    Migraines Father    Heart failure Father    Migraines Sister    Migraines Brother     Medications:   Current Outpatient Medications on File Prior to Visit  Medication Sig Dispense Refill   apixaban (ELIQUIS) 5 MG TABS tablet Take by mouth.     buPROPion (WELLBUTRIN XL) 150 MG 24 hr tablet Take 150 mg by mouth daily.     dicyclomine (BENTYL) 10 MG capsule Take 10 mg by mouth 4 (four) times daily -  before meals and at bedtime.     diltiazem (CARDIZEM) 30 MG tablet Take 1 tablet (30 mg total) by mouth 3 (three) times daily as needed. 90 tablet 6   EPINEPHrine 0.3 mg/0.3 mL IJ SOAJ injection Inject into the muscle.     Eptinezumab-jjmr (VYEPTI IV) Inject 100 mg into the vein  every 3 (three) months. One dose, 3-4 wks ago     fluticasone (FLONASE) 50 MCG/ACT nasal spray Place 1 spray into both nostrils daily.     Multiple Vitamins-Minerals (PRESERVISION AREDS PO) Take 1 tablet by mouth daily.     omeprazole (PRILOSEC) 20 MG capsule Take 20 mg by mouth daily as needed (for acid reflux).     pregabalin (LYRICA) 75 MG capsule Take 150 mg by mouth at bedtime.     propranolol (INDERAL) 40 MG tablet Take 40 mg by mouth every evening.     Rimegepant Sulfate (NURTEC) 75 MG TBDP Take 75 mg by mouth daily as needed. For migraines. Take as close to onset of migraine as possible. One daily maximum. 16 tablet 11   rosuvastatin (CRESTOR) 40 MG tablet Take 40 mg by mouth daily.     sertraline (ZOLOFT) 100 MG tablet Take 200 mg by mouth every morning.      venlafaxine XR (EFFEXOR-XR) 37.5 MG 24 hr capsule Take 37.5 mg by mouth at bedtime. for migraines     No current facility-administered medications on file prior to visit.    Allergies:   Allergies  Allergen Reactions   Bee Venom Anaphylaxis    Throat swelling  Throat swelling    Codeine Other (See Comments)    hallucinations Other reaction(s): Delusions (intolerance), Hallucinations   Oxycodone-Acetaminophen Anxiety    Other reaction(s): Other, Other (See Comments) hyper hyper       OBJECTIVE:  Physical Exam  Vitals:   03/19/23 0728  BP: 124/75  Pulse: 73  Weight: 173 lb 3.2 oz (78.6 kg)  Height: 5\' 2"  (1.575 m)   Body mass index is 31.68 kg/m. No results found.   General: well developed, well nourished, very pleasant middle-age Caucasian female, seated, in no evident distress Head: head normocephalic and atraumatic.   Neck: supple with no carotid or supraclavicular bruits Cardiovascular: regular rate and rhythm, no murmurs Musculoskeletal: no deformity Skin:  no rash/petichiae Vascular:  Normal pulses all extremities   Neurologic Exam Mental Status: Awake and fully alert. Oriented to place and  time. Recent and remote memory intact. Attention span, concentration and fund of knowledge appropriate. Mood and affect appropriate.  Cranial Nerves: Pupils equal, briskly reactive to light. Extraocular movements full without nystagmus. Visual fields full to confrontation. Hearing intact. Facial sensation intact. Face, tongue, palate moves normally and symmetrically.  Motor: Normal bulk and tone. Normal strength in all tested extremity muscles Sensory.: intact to touch , pinprick , position and vibratory sensation.  Coordination: Rapid  alternating movements normal in all extremities. Finger-to-nose and heel-to-shin performed accurately bilaterally. Gait and Station: Arises from chair without difficulty. Stance is normal. Gait demonstrates normal stride length and balance without use of AD.  Reflexes: 1+ and symmetric. Toes downgoing.         ASSESSMENT/PLAN: Robin Robles is a 67 y.o. year old female    OSA on CPAP : Compliance report shows satisfactory usage although slightly elevated AHI at 6.2.  Will slightly adjust pressure settings from 4-10 to 4-12. Will recheck download in 2 weeks. Will send order to DME [facility] requesting change of interface as current difficulty tolerating nasal mask. Discussed continued nightly usage with ensuring greater than 4 hours nightly for optimal benefit and per insurance purposes.  Continue to follow with DME company for any needed supplies or CPAP related concerns     Follow up in 6 months or call earlier if needed   CC:  PCP: Chesley Noon, MD    I spent 21 minutes of face-to-face and non-face-to-face time with patient and husband.  This included previsit chart review, lab review, study review, order entry, electronic health record documentation, patient education regarding diagnosis of sleep apnea with review and discussion of compliance report and answered all other questions to patient's satisfaction   Frann Rider,  [facility]  [facility] [address] [place], [place] [address]  Phone [phone] Fax [phone] Note: This document was prepared with digital dictation and possible smart phrase technology. Any transcriptional errors that result from this process are unintentional.

## 2023-03-19 ENCOUNTER — Encounter: Payer: Self-pay | Admitting: Adult Health

## 2023-03-19 ENCOUNTER — Ambulatory Visit: Payer: Medicare Other | Admitting: Adult Health

## 2023-03-19 VITALS — BP 124/75 | HR 73 | Ht 62.0 in | Wt 173.2 lb

## 2023-03-19 DIAGNOSIS — G4733 Obstructive sleep apnea (adult) (pediatric): Secondary | ICD-10-CM

## 2023-03-19 NOTE — Patient Instructions (Addendum)
Your Plan:  Continue nightly use of CPAP  Will adjust pressure settings and will send order to your DME company requesting change of CPAP mask     Follow-up in 6 months or call earlier if needed     Thank you for coming to see [place] at [facility]. I hope we have been able to provide you high quality care today.  You may receive a patient satisfaction survey over the next few weeks. We would appreciate your feedback and comments so that we may continue to improve ourselves and the health of our patients.

## 2023-04-16 ENCOUNTER — Telehealth: Payer: Self-pay | Admitting: Neurology

## 2023-04-16 NOTE — Telephone Encounter (Signed)
Sent mychart msg informing pt of appointment change due to provider template changing 

## 2023-04-30 ENCOUNTER — Encounter (INDEPENDENT_AMBULATORY_CARE_PROVIDER_SITE_OTHER): Payer: Self-pay | Admitting: Ophthalmology

## 2023-05-03 NOTE — Progress Notes (Addendum)
[facility] - Clinic Note  05/09/2023     CHIEF COMPLAINT Patient presents for Retina Follow Up    HISTORY OF PRESENT ILLNESS: Robin Robles is a 67 y.o. female who presents to the clinic today for:   HPI     Retina Follow Up   Patient presents with  Dry AMD.  In both eyes.  This started 2 weeks ago.  Severity is moderate.  Duration of 9 months.  Since onset it is stable.  I, the attending physician,  performed the HPI with the patient and updated documentation appropriately.        Comments   Pt here for 9 mo ret f/u for dry exu ARMD. Pt states 2 wks ago she started noticing 2 new floaters OS that tend to move around everywhere. No FOL reported, no wavy lines. No other VA complaints. Suffered 2 strokes in Sept 2023.       Last edited by Rennis Chris, MD on 05/09/2023  4:20 PM.    Pt states vision is stable, she is seeing a floater in her left eye that has been there since the beginning of May  Referring physician: Sallye Lat, MD [address] [place],  [place] [address]  HISTORICAL INFORMATION:   Selected notes from the MEDICAL RECORD NUMBER Referred by Dr. Marchelle Gearing for eval of ARMD OU   CURRENT MEDICATIONS: No current outpatient medications on file. (Ophthalmic Drugs)   No current facility-administered medications for this visit. (Ophthalmic Drugs)   Current Outpatient Medications (Other)  Medication Sig   apixaban (ELIQUIS) 5 MG TABS tablet Take by mouth.   buPROPion (WELLBUTRIN XL) 150 MG 24 hr tablet Take 150 mg by mouth daily.   dicyclomine (BENTYL) 10 MG capsule Take 10 mg by mouth 4 (four) times daily -  before meals and at bedtime.   diltiazem (CARDIZEM) 30 MG tablet Take 1 tablet (30 mg total) by mouth 3 (three) times daily as needed.   EPINEPHrine 0.3 mg/0.3 mL IJ SOAJ injection Inject into the muscle.   Eptinezumab-jjmr (VYEPTI IV) Inject 100 mg into the vein every 3 (three) months. One dose, 3-4 wks ago    fluticasone (FLONASE) 50 MCG/ACT nasal spray Place 1 spray into both nostrils daily.   Multiple Vitamins-Minerals (PRESERVISION AREDS PO) Take 1 tablet by mouth daily.   omeprazole (PRILOSEC) 20 MG capsule Take 20 mg by mouth daily as needed (for acid reflux).   pregabalin (LYRICA) 75 MG capsule Take 150 mg by mouth at bedtime.   propranolol (INDERAL) 40 MG tablet Take 40 mg by mouth every evening.   Rimegepant Sulfate (NURTEC) 75 MG TBDP Take 75 mg by mouth daily as needed. For migraines. Take as close to onset of migraine as possible. One daily maximum.   rosuvastatin (CRESTOR) 40 MG tablet Take 40 mg by mouth daily.   sertraline (ZOLOFT) 100 MG tablet Take 200 mg by mouth every morning.    venlafaxine XR (EFFEXOR-XR) 37.5 MG 24 hr capsule Take 37.5 mg by mouth at bedtime. for migraines   No current facility-administered medications for this visit. (Other)   REVIEW OF SYSTEMS: ROS   Positive for: Neurological, Musculoskeletal, Eyes Negative for: Constitutional, Gastrointestinal, Skin, Genitourinary, HENT, Endocrine, Cardiovascular, Respiratory, Psychiatric, Allergic/Imm, Heme/Lymph Last edited by Thompson Grayer, COT on 05/09/2023 12:51 PM.       ALLERGIES Allergies  Allergen Reactions   Bee Venom Anaphylaxis    Throat swelling  Throat swelling  Codeine Other (See Comments)    hallucinations Other reaction(s): Delusions (intolerance), Hallucinations   Oxycodone-Acetaminophen Anxiety    Other reaction(s): Other, Other (See Comments) hyper hyper    PAST MEDICAL HISTORY Past Medical History:  Diagnosis Date   Fibromyalgia    Hypertension    Migraines    Stroke [facility])    Past Surgical History:  Procedure Laterality Date   ABDOMINAL HYSTERECTOMY     CHOLECYSTECTOMY     sinus     THYROID SURGERY     FAMILY HISTORY Family History  Problem Relation Age of Onset   Heart failure Mother    Migraines Father    Heart failure Father    Migraines Sister    Migraines  Brother    SOCIAL HISTORY Social History   Tobacco Use   Smoking status: Never   Smokeless tobacco: Never  Vaping Use   Vaping Use: Never used  Substance Use Topics   Alcohol use: No   Drug use: No       OPHTHALMIC EXAM: Base Eye Exam     Visual Acuity (Snellen - Linear)       Right Left   Dist cc 20/20 -2 20/25 -2   Dist ph cc  NI    Correction: Glasses         Tonometry (Tonopen, 12:56 PM)       Right Left   Pressure 13 15         Pupils       Pupils Dark Light Shape React APD   Right PERRL 4 3 Round Brisk None   Left PERRL 4 3 Round Brisk None         Visual Fields (Counting fingers)       Left Right    Full Full         Extraocular Movement       Right Left    Full, Ortho Full, Ortho         Neuro/Psych     Oriented x3: Yes   Mood/Affect: Normal         Dilation     Both eyes: 1.0% Mydriacyl, 2.5% Phenylephrine @ 12:57 PM           Slit Lamp and Fundus Exam     External Exam       Right Left   External Normal Normal         Slit Lamp Exam       Right Left   Lids/Lashes Dermatochalasis - upper lid, mild MGD Dermatochalasis - upper lid, mild MGD   Conjunctiva/Sclera White and quiet White and quiet   Cornea arcus, well healed cataract wound mild arcus, well healed cataract wound, trace PEE   Anterior Chamber deep and clear deep and clear   Iris Round and dilated Round and dilated   Lens PC IOL in good position, 1-2+Posterior capsular opacification PC IOL in good position, trace Posterior capsular opacification   Anterior Vitreous Vitreous syneresis, Posterior vitreous detachment Vitreous syneresis, Posterior vitreous detachment         Fundus Exam       Right Left   Disc mild pallor, sharp rim, mild PPA, +cupping Pink and Sharp, mild PPA/PPP   C/D Ratio 0.75 0.7   Macula Flat, Good foveal reflex, fine Drusen, mild RPE mottling, No heme or edema Flat, Good foveal reflex, Drusen, RPE mottling and clumping, No  heme or edema   Vessels attenuated, mild tortuosity attenuated, mild tortuosity  Periphery Attached, focal peripheral drusen at 0300, no heme Attached, No heme, no new RT/RD           Refraction     Wearing Rx       Sphere Cylinder Axis Add   Right [place] +1.25 110 +2.50   Left [place] +1.00 097 +2.50           IMAGING AND PROCEDURES  Imaging and Procedures for 05/09/2023  OCT, Retina - OU - Both Eyes       Right Eye Quality was good. Central Foveal Thickness: 277. Progression has been stable. Findings include normal foveal contour, no IRF, no SRF, retinal drusen .   Left Eye Quality was good. Central Foveal Thickness: 279. Progression has been stable. Findings include normal foveal contour, no IRF, no SRF, retinal drusen (Interval release of Partial PVD).   Notes *Images captured and stored on drive  Diagnosis / Impression:  Non-exu ARMD OU -- Stable drusen, no fluid OU NFP, no IRF/SRF OU OS: interval release of partial PVD  Clinical management:  See below  Abbreviations: NFP - Normal foveal profile. CME - cystoid macular edema. PED - pigment epithelial detachment. IRF - intraretinal fluid. SRF - subretinal fluid. EZ - ellipsoid zone. ERM - epiretinal membrane. ORA - outer retinal atrophy. ORT - outer retinal tubulation. SRHM - subretinal hyper-reflective material. IRHM - intraretinal hyper-reflective material            ASSESSMENT/PLAN:    ICD-10-CM   1. Intermediate stage nonexudative age-related macular degeneration of both eyes  H35.3132 OCT, Retina - OU - Both Eyes    2. Posterior vitreous detachment of both eyes  H43.813     3. 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 Essential hypertension  I10     5. Hypertensive retinopathy of both eyes  H35.033     6. Pseudophakia, both eyes  Z96.1      1. Age related macular degeneration, non-exudative, OU  - intermediate stage -- stable  - no exudative disease on exam or OCT  - The incidence, anatomy, and  pathology of dry AMD, risk of progression, and the AREDS and AREDS 2 studies including smoking risks discussed with patient.  - Continue amsler grid monitoring  - f/u 9 months, sooner prn -- DFE, OCT  2. PVD / vitreous syneresis OU  - acute PVD OS w/ symptomatic floaters -- onset early May 2024  - Discussed findings and prognosis  - No RT or RD on 360 peripheral exam  - Reviewed s/s of RT/RD  - Strict return precautions for any such RT/RD signs/symptoms  - monitor  3. Ocular migraine v migraine w/ aura  - pt with known history of migraines w/ aura, but auras have changed from wavy lines and colors to a symmetrical object with black lines emanating from it  - eye exam without ophthalmic findings to explain symptoms -- making migraine mechanism more likely  - no retinal or ophthalmic interventions indicated or recommended  - recommend discussing with neurologist for further recommendations and input  4,5. Hypertensive retinopathy OU - discussed importance of tight BP control - monitor  6. Pseudophakia OU  - s/p CE/IOL OU (Dr. Zetta Bills)  - IOLs in good position, doing well  - monitor  Ophthalmic Meds Ordered this visit:  No orders of the defined types were placed in this encounter.    Return in about 9 months (around 02/09/2024) for f/u non-exu ARMD OU, DFE, OCT.  There are  no Patient Instructions on file for this visit.   Explained the diagnoses, plan, and follow up with the patient and they expressed understanding.  Patient expressed understanding of the importance of proper follow up care.   This document serves as a record of services personally performed by Karie Chimera, MD, PhD. It was created on their behalf by De Blanch, an ophthalmic technician. The creation of this record is the provider's dictation and/or activities during the visit.    Electronically signed by: De Blanch, OA, 05/09/23  4:53 PM  This document serves as a record of services personally  performed by Karie Chimera, MD, PhD. It was created on their behalf by Glee Arvin. Manson Passey, OA an ophthalmic technician. The creation of this record is the provider's dictation and/or activities during the visit.    Electronically signed by: Glee Arvin. Manson Passey, [place] 05.15.2024 4:53 PM  Karie Chimera, M.D., Ph.D. Diseases & Surgery of the Retina and Vitreous [facility]  I have reviewed the above documentation for accuracy and completeness, and I agree with the above. Karie Chimera, M.D., Ph.D. 05/09/23 4:53 PM  Abbreviations: M myopia (nearsighted); A astigmatism; H hyperopia (farsighted); P presbyopia; Mrx spectacle prescription;  CTL contact lenses; OD right eye; OS left eye; OU both eyes  XT exotropia; ET esotropia; PEK punctate epithelial keratitis; PEE punctate epithelial erosions; DES dry eye syndrome; MGD meibomian gland dysfunction; ATs artificial tears; PFAT's preservative free artificial tears; NSC nuclear sclerotic cataract; PSC posterior subcapsular cataract; ERM epi-retinal membrane; PVD posterior vitreous detachment; RD retinal detachment; DM diabetes mellitus; DR diabetic retinopathy; NPDR non-proliferative diabetic retinopathy; PDR proliferative diabetic retinopathy; CSME clinically significant macular edema; DME diabetic macular edema; dbh dot blot hemorrhages; CWS cotton wool spot; POAG primary open angle glaucoma; C/D cup-to-disc ratio; HVF humphrey visual field; GVF goldmann visual field; OCT optical coherence tomography; IOP intraocular pressure; BRVO Branch retinal vein occlusion; CRVO central retinal vein occlusion; CRAO central retinal artery occlusion; BRAO branch retinal artery occlusion; RT retinal tear; SB scleral buckle; PPV pars plana vitrectomy; VH Vitreous hemorrhage; PRP panretinal laser photocoagulation; IVK intravitreal kenalog; VMT vitreomacular traction; MH Macular hole;  NVD neovascularization of the disc; NVE neovascularization elsewhere; AREDS  age related eye disease study; ARMD age related macular degeneration; POAG primary open angle glaucoma; EBMD epithelial/anterior basement membrane dystrophy; ACIOL anterior chamber intraocular lens; IOL intraocular lens; PCIOL posterior chamber intraocular lens; Phaco/IOL phacoemulsification with intraocular lens placement; PRK photorefractive keratectomy; LASIK laser assisted in situ keratomileusis; HTN hypertension; DM diabetes mellitus; COPD chronic obstructive pulmonary disease

## 2023-05-09 ENCOUNTER — Encounter (INDEPENDENT_AMBULATORY_CARE_PROVIDER_SITE_OTHER): Payer: Self-pay | Admitting: Ophthalmology

## 2023-05-09 ENCOUNTER — Ambulatory Visit (INDEPENDENT_AMBULATORY_CARE_PROVIDER_SITE_OTHER): Payer: Medicare Other | Admitting: Ophthalmology

## 2023-05-09 DIAGNOSIS — H353132 Nonexudative age-related macular degeneration, bilateral, intermediate dry stage: Secondary | ICD-10-CM

## 2023-05-09 DIAGNOSIS — G43109 Migraine with aura, not intractable, without status migrainosus: Secondary | ICD-10-CM

## 2023-05-09 DIAGNOSIS — H35033 Hypertensive retinopathy, bilateral: Secondary | ICD-10-CM

## 2023-05-09 DIAGNOSIS — H43813 Vitreous degeneration, bilateral: Secondary | ICD-10-CM | POA: Diagnosis not present

## 2023-05-09 DIAGNOSIS — Z961 Presence of intraocular lens: Secondary | ICD-10-CM

## 2023-05-09 DIAGNOSIS — I10 Essential (primary) hypertension: Secondary | ICD-10-CM

## 2023-05-28 ENCOUNTER — Telehealth: Payer: Medicare Other | Admitting: Neurology

## 2023-05-29 NOTE — Telephone Encounter (Signed)
From patient.

## 2023-06-04 NOTE — Telephone Encounter (Signed)
Received fax from [facility]:  [phone], or [phone] fax [phone].     06-04-2023 appointment for Vyepti infusion for pt 100mg  administered without complications. Next appointment 09-05-2023 at 10:00 AM.

## 2023-06-15 ENCOUNTER — Encounter: Payer: Self-pay | Admitting: Cardiology

## 2023-06-18 ENCOUNTER — Encounter: Payer: Self-pay | Admitting: Neurology

## 2023-06-21 ENCOUNTER — Telehealth: Payer: Self-pay | Admitting: Neurology

## 2023-06-21 ENCOUNTER — Telehealth (INDEPENDENT_AMBULATORY_CARE_PROVIDER_SITE_OTHER): Payer: Medicare Other | Admitting: Neurology

## 2023-06-21 DIAGNOSIS — G43711 Chronic migraine without aura, intractable, with status migrainosus: Secondary | ICD-10-CM

## 2023-06-21 NOTE — Patient Instructions (Signed)
Continue vyepti and nurtec  Rimegepant Disintegrating Tablets What is this medication? RIMEGEPANT (ri ME je pant) prevents and treats migraines. It works by blocking a substance in the body that causes migraines. This medicine may be used for other purposes; ask your health care provider or pharmacist if you have questions. COMMON BRAND NAME(S): NURTEC ODT What should I tell my care team before I take this medication? They need to know if you have any of these conditions: Kidney disease Liver disease An unusual or allergic reaction to rimegepant, other medications, foods, dyes, or preservatives Pregnant or trying to get pregnant Breast-feeding How should I use this medication? Take this medication by mouth. Take it as directed on the prescription label. Leave the tablet in the sealed pack until you are ready to take it. With dry hands, open the pack and gently remove the tablet. If the tablet breaks or crumbles, throw it away. Use a new tablet. Place the tablet in the mouth and allow it to dissolve. Then, swallow it. Do not cut, crush, or chew this medication. You do not need water to take this medication. Talk to your care team about the use of this medication in children. Special care may be needed. Overdosage: If you think you have taken too much of this medicine contact a poison control center or emergency room at once. NOTE: This medicine is only for you. Do not share this medicine with others. What if I miss a dose? This does not apply. This medication is not for regular use. What may interact with this medication? Certain medications for fungal infections, such as fluconazole, itraconazole Rifampin This list may not describe all possible interactions. Give your health care provider a list of all the medicines, herbs, non-prescription drugs, or dietary supplements you use. Also tell them if you smoke, drink alcohol, or use illegal drugs. Some items may interact with your medicine. What  should I watch for while using this medication? Visit your care team for regular checks on your progress. Tell your care team if your symptoms do not start to get better or if they get worse. What side effects may I notice from receiving this medication? Side effects that you should report to your care team as soon as possible: Allergic reactions--skin rash, itching, hives, swelling of the face, lips, tongue, or throat Side effects that usually do not require medical attention (report to your care team if they continue or are bothersome): Nausea Stomach pain This list may not describe all possible side effects. Call your doctor for medical advice about side effects. You may report side effects to FDA at 1-800-FDA-1088. Where should I keep my medication? Keep out of the reach of children and pets. Store at room temperature between 20 and 25 degrees C (68 and 77 degrees F). Get rid of any unused medication after the expiration date. To get rid of medications that are no longer needed or have expired: Take the medication to a medication take-back program. Check with your pharmacy or law enforcement to find a location. If you cannot return the medication, check the label or package insert to see if the medication should be thrown out in the garbage or flushed down the toilet. If you are not sure, ask your care team. If it is safe to put it in the trash, take the medication out of the container. Mix the medication with cat litter, dirt, coffee grounds, or other unwanted substance. Seal the mixture in a bag or container. Put it  in the trash. NOTE: This sheet is a summary. It may not cover all possible information. If you have questions about this medicine, talk to your doctor, pharmacist, or health care provider.  2024 Elsevier/Gold Standard (2022-02-01 00:00:00) Eptinezumab Injection What is this medication? EPTINEZUMAB (EP ti NEZ ue mab) prevents migraines. It works by blocking a substance in the  body that causes migraines. It is a monoclonal antibody. This medicine may be used for other purposes; ask your health care provider or pharmacist if you have questions. COMMON BRAND NAME(S): Vyepti What should I tell my care team before I take this medication? They need to know if you have any of these conditions: An unusual or allergic reaction to eptinezumab, other medications, foods, dyes, or preservatives Pregnant or trying to get pregnant Breast-feeding How should I use this medication? This medication is injected into a vein. It is given by your care team in a hospital or clinic setting. Talk to your care team about the use of this medication in children. Special care may be needed. Overdosage: If you think you have taken too much of this medicine contact a poison control center or emergency room at once. NOTE: This medicine is only for you. Do not share this medicine with others. What if I miss a dose? Keep appointments for follow-up doses. It is important not to miss your dose. Call your care team if you are unable to keep an appointment. What may interact with this medication? Interactions are not expected. This list may not describe all possible interactions. Give your health care provider a list of all the medicines, herbs, non-prescription drugs, or dietary supplements you use. Also tell them if you smoke, drink alcohol, or use illegal drugs. Some items may interact with your medicine. What should I watch for while using this medication? Your condition will be monitored carefully while you are receiving this medication. What side effects may I notice from receiving this medication? Side effects that you should report to your care team as soon as possible: Allergic reactions or angioedema--skin rash, itching or hives, swelling of the face, eyes, lips, tongue, arms, or legs, trouble swallowing or breathing Side effects that usually do not require medical attention (report to your  care team if they continue or are bothersome): Runny or stuffy nose This list may not describe all possible side effects. Call your doctor for medical advice about side effects. You may report side effects to FDA at 1-800-FDA-1088. Where should I keep my medication? This medication is given in a hospital or clinic. It will not be stored at home. NOTE: This sheet is a summary. It may not cover all possible information. If you have questions about this medicine, talk to your doctor, pharmacist, or health care provider.  2024 Elsevier/Gold Standard (2022-02-06 00:00:00)

## 2023-06-21 NOTE — Telephone Encounter (Signed)
Schedule one year follow up with dr Lucia Gaskins for migraines

## 2023-06-21 NOTE — Progress Notes (Signed)
[name]    Provider:  Dr Lucia Gaskins Requesting Provider: Eartha Inch, MD Primary Care Provider:  Eartha Inch, MD  CC:  Hospital follow up \  Virtual Visit via Video Note  I connected with Robin Robles on 06/21/2023 at  1:00 PM EDT by a video enabled telemedicine application and verified that I am speaking with the correct person using two identifiers.  Location: Patient: home Provider: office   I discussed the limitations of evaluation and management by telemedicine and the availability of in person appointments. The patient expressed understanding and agreed to proceed.   Follow Up Instructions:    I discussed the assessment and treatment plan with the patient. The patient was provided an opportunity to ask questions and all were answered. The patient agreed with the plan and demonstrated an understanding of the instructions.   The patient was advised to call back or seek an in-person evaluation if the symptoms worsen or if the condition fails to improve as anticipated.  I provided 11 minutes of non-face-to-face time during this encounter.   Robin Fret, MD   06/21/2023: Still loves the vyepti. She had some nurtec samples she take 2-3 a month. She can't afford it, it was $1600 for the month.  Bernita Raisin is expensive as well.  I wil find some nurtec samples and leave them up front. Cpap is doing well. Can't take triptans due to stroke will try to supply with samples.   Patient complains of symptoms per HPI as well as the following symptoms: none . Pertinent negatives and positives per HPI. All others negative    Follow up 12/15/2022: Patient is here for follow up. We diagnosed her with afib likely as the cause of her stroke and started her on blood thinners. She saw our sleep doctor and had a sleep study I don;t see results yet(also saw afib on sleep study).  He has a history of migraines seeing Robin Robles who is now retiring.  So I believe  she was originally here for migraines but she was seen in the emergency room December 08, 2022 for migraine headache, confusion, tingling to left fingertips and toes, CT of the head was negative, she was already on Xarelto for previous history of A-fib, and previous CVA, MRI showed maybe a small focus of increased signal on diffusion-weighted imaging in the right superior temporal lobe without an associated correlate on ADC map which may represent a small subacute infarct but no hemorrhage, neurology reviewed it and suspected the stroke likely nonacute and very small, she did not miss any doses of her Xarelto, and she was sent for outpatient follow-up.  Patient's migraine improved.  I reviewed Robin Robles's notes, she is on Relpax, Vyepti, type tizanidine, Bernita Raisin, she was on Aimovig which was ineffective, she declined to be treated with Botox, she has tried multiple other medications such as Maxalt, baclofen,  She is on Eliquis. She tried xarelto because eliquis was pricey but HAS been on eliquis. 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 Prior to vyepti had daily migraines, pulsating/pounding/throbbing/photophobia, sounds and smells would trigger, nausea, used to vomiting, would last 24 hours and she had them daily for > 1 year. On the vyepti she is doing better, usually on the right side behind the eye and moderate to severe and can radiate to the back of the neck,  she has had the vyepti twice only at 100mg  and she is 80% better and no side effects. For the migraine breakthroughs she cannot take triptan due to stroke. Now she has 5 migraine days a month and < 10 total headache days a month. No other focal neurologic deficits, associated symptoms, inciting events or modifiable factors.  Patient complains of symptoms per HPI as well as the following symptoms: migraines . Pertinent negatives and positives per HPI. All  others negative   Reviewed laconic Robles's notes on medications patient is tried in the past:  Current and past medications: ANALGESICS:Tylenol ANTI-MIGRAINE:Imitrex (tachycardia), Maxalt (ineffective), Ubrelvy ($$$), Nurtec ($$$), relpax HEART/BP: Inderal DECONGESTANT/ANTIHISTAMINE: ANTI-NAUSEANT: NSAIDS: MUSCLE RELAXANTS:tizanidine, baclofen (no help)  ANTI-CONVULSANTS: Lyrica, Gabapentin, Topamax (cog. SE), Trokendi XR  STEROIDS: SLEEPING PILLS/TRANQUILIZERS: ANTI-DEPRESSANTS: Zoloft, Cymbalta, wellbutrin HERBAL:  FIBROMYALGIA:  HORMONAL: OTHER:Aimovig (ineffective)  PROCEDURES FOR HEADACHES:   HPI 09/27/2022:  Robin Robles is a 67 y.o. female here as requested by Robin Inch, MD for hospital follow up.  She has a past medical history of hypertension, chronic migraine, depression admitted to for positive stroke in September of this year.  I reviewed notes from Dr. Raynald Kemp inpatient, chronic migraines since 67 years old, started on Topamax in the past, she noticed intermittent confusion word finding difficulty at work, she follows with Robin Robles neurology and eventually was told to stop Topamax, she has tried many medications including a.m. of a which have not been effective, eventually an MRI with and without contrast on September 20, 2022 reported tiny acute infarct in the posterior right temporal lobe and possibly another tiny acute/subacute infarct in the right frontal lobe and she was sent to the hospital.  LDL was high and she was not on any anticholesterol medication CTA of the head neck was found without significant finding.   She was admitted in September with a headache. She is here with her husband who provides information. Sometimes she feels like her heart stopped, she saw Dr. Jacinto Halim and she has an extra beat and it has to catch up. She has had some hypotension but this would not account the 2 punctate strokes (that would be more watershed). She is on DUAP right now, was not  on aspirin before. Currently on Plavix and ASA and when 3 weeks is over ASA 81mg  indefinitely. LDL 176 need to get it down to < 70, placed on lipitor and follow with primary care for adjustments. She has had a sleep test 15-16 years ago. She snores heavily per husband who provides much information, she is tried all the time, no energy, fatigued, wakes up with morning headaches; recommend a sleep study.No other focal neurologic deficits, associated symptoms, inciting events or modifiable factors.  Reviewed notes, labs and imaging from outside physicians, which showed:  MRI 09/20/2022: IMPRESSION:  [facility] (asked for them to load onto canopy for me) 1.  Tiny acute infarction in the posterior RIGHT temporal lobe and possibly another tiny acute/subacute infarction in the RIGHT frontal lobe.  2.  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    09/23/2022: CTA H&N: FINDINGS: CT HEAD FINDINGS   Brain: No evidence of acute infarction, hemorrhage, hydrocephalus, extra-axial collection or mass lesion/mass effect.   Vascular: Negative   Skull: Normal. Negative for fracture or focal lesion.   Sinuses/Orbits: No acute finding.   Review of the MIP images confirms the above findings   CTA NECK FINDINGS   Aortic arch: Atheromatous calcification.  Three vessel branching  Right carotid system: Vessels are smooth and diffusely patent. No significant calcification.   Left carotid system: Vessels are smooth and diffusely patent. Low-density plaque at the ICA bulb causes 40% narrowing. No ulceration.   Vertebral arteries: No proximal subclavian stenosis. Left dominant vertebral artery. Both vertebral arteries are smoothly contoured and widely patent to the dura.   Skeleton: No acute finding   Other neck: Absent left lobe thyroid.   Upper chest: Clear apical lungs   Review of the MIP images confirms the above findings   CTA HEAD FINDINGS   Anterior circulation:  Atheromatous calcification affecting the carotid siphons. No branch occlusion, beading, or aneurysm.   Posterior circulation: Vertebral and basilar arteries are smoothly contoured and widely patent. No branch occlusion, beading, or aneurysm.   Venous sinuses: Unremarkable   Anatomic variants: None significant   Review of the MIP images confirms the above findings   IMPRESSION: 1. No emergent finding. 2. 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    Reviewed Donnella Sham Robles's notes:  Robin Robles 07/26/2022: Assessment and Plan:  1. Chronic migraine without aura without status migrainosus, not intractable (Primary) - eptinezumab-jjmr (VYEPTI) 100 mg/mL injection; Inject 1 mL (100 mg dose) into the vein every 3 (three) months., Starting Wed 07/26/2022, Print - eletriptan (RELPAX) 40 MG tablet; Take one tablet (40 mg dose) by mouth 2 (two) times a day as needed. may take second dose after 2 hours if necessary. Max 2 tablets/24 hours, Starting Thu 08/03/2022, Normal  Discontnue Aimovig (ineffective) She declines to be treated with Botox Begin Vyepti 100 mg IV every three months [facility])  Discontinue Maxalt (ineffective) Discontinue baclofen (ineffective) Begin Ubrelvy 100 mg as needed Begin Tizanidine 4 mg as needed  Headache diary HPI This is a chronic problem. Episode onset: Started at age 69. The problem occurs intermittently and has been waxing and waning. The pain is located in the right unilateral and retro-orbital regions. The quality of the pain is described as band-like, throbbing, stabbing and sharp. The pain is severe. Associated symptoms include dizziness, ear pain, eye pain, numbness, phonophobia, photophobia and tinnitus. Pertinent negatives include no nausea or vomiting. The symptoms are aggravated by emotional stress and bright light. They are worse with movement.  The headaches will last four hours to several days. There is a  family history of headache. There is no described aura associated with the headache.   Currently, headache pain occurs 22 days out of the month. (25 headache days per month at last OV). The headaches can last up to 3 days.  Headaches have not improved. She feels as though they may be more intense and longer in duration.   To treat the headache pain abortively, she is using Maxalt 10 mg as needed. She is using this medication about 1-2 times a week. It is not helpful to relieve symptoms. Baclofen 10 mg has not been helpful for her.   She is injecting Aimovig 140 mg sq monthly. She denies side effects. It has not been helpful to reduce the frequency and intensity of migraine symptoms.   Current and past medications: ANALGESICS:Tylenol ANTI-MIGRAINE:Imitrex (tachycardia), Maxalt (ineffective), Ubrelvy ($$$), Nurtec ($$$), relpax HEART/BP: Inderal DECONGESTANT/ANTIHISTAMINE: ANTI-NAUSEANT: NSAIDS: MUSCLE RELAXANTS:tizanidine, baclofen (no help)  ANTI-CONVULSANTS: Lyrica, Gabapentin, Topamax (cog. SE), Trokendi XR  STEROIDS: SLEEPING PILLS/TRANQUILIZERS: ANTI-DEPRESSANTS: Zoloft, Cymbalta, wellbutrin HERBAL:  FIBROMYALGIA:  HORMONAL: OTHER:Aimovig (ineffective)  PROCEDURES FOR HEADACHES:   Review of Systems: Patient complains of symptoms per HPI as well as the following  symptoms chronic migraines. Pertinent negatives and positives per HPI. All others negative.   Social History   Socioeconomic History   Marital status: Married    Spouse name: Not on file   Number of children: 2   Years of education: Not on file   Highest education level: Not on file  Occupational History   Not on file  Tobacco Use   Smoking status: Never   Smokeless tobacco: Never  Vaping Use   Vaping Use: Never used  Substance and Sexual Activity   Alcohol use: No   Drug use: No   Sexual activity: Not Currently  Other Topics Concern   Not on file  Social History Narrative   Not on file   Social  Determinants of Health   Financial Resource Strain: Not on file  Food Insecurity: No Food Insecurity (09/23/2022)   Hunger Vital Sign    Worried About Running Out of Food in the Last Year: Never true    Ran Out of Food in the Last Year: Never true  Transportation Needs: Unknown (09/23/2022)   PRAPARE - Administrator, Civil Service (Medical): Not on file    Lack of Transportation (Non-Medical): No  Physical Activity: Not on file  Stress: Not on file  Social Connections: Not on file  Intimate Partner Violence: Not At Risk (09/23/2022)   Humiliation, Afraid, Rape, and Kick questionnaire    Fear of Current or Ex-Partner: No    Emotionally Abused: No    Physically Abused: No    Sexually Abused: No    Family History  Problem Relation Age of Onset   Heart failure Mother    Migraines Father    Heart failure Father    Migraines Sister    Migraines Brother     Past Medical History:  Diagnosis Date   Fibromyalgia    Hypertension    Migraines    Stroke [facility])     Patient Active Problem List   Diagnosis Date Noted   Mixed hyperlipidemia 11/27/2022   Paroxysmal Atrial Fibrillation 11/27/2022   History of night terrors 11/14/2022   Snoring 11/14/2022   Excessive daytime sleepiness 11/14/2022   PTSD (post-traumatic stress disorder) 11/14/2022   Acute CVA (cerebrovascular accident) (HCC) 09/23/2022   Essential hypertension 09/23/2022   Depression 09/23/2022   Upper airway cough syndrome 07/31/2017    Past Surgical History:  Procedure Laterality Date   ABDOMINAL HYSTERECTOMY     CHOLECYSTECTOMY     sinus     THYROID SURGERY      Current Outpatient Medications  Medication Sig Dispense Refill   apixaban (ELIQUIS) 5 MG TABS tablet Take by mouth.     buPROPion (WELLBUTRIN XL) 150 MG 24 hr tablet Take 150 mg by mouth daily.     dicyclomine (BENTYL) 10 MG capsule Take 10 mg by mouth 4 (four) times daily -  before meals and at bedtime.     diltiazem (CARDIZEM) 30 MG  tablet Take 1 tablet (30 mg total) by mouth 3 (three) times daily as needed. 90 tablet 6   EPINEPHrine 0.3 mg/0.3 mL IJ SOAJ injection Inject into the muscle.     Eptinezumab-jjmr (VYEPTI IV) Inject 100 mg into the vein every 3 (three) months. One dose, 3-4 wks ago     fluticasone (FLONASE) 50 MCG/ACT nasal spray Place 1 spray into both nostrils daily.     Multiple Vitamins-Minerals (PRESERVISION AREDS PO) Take 1 tablet by mouth daily.     omeprazole (PRILOSEC) 20  MG capsule Take 20 mg by mouth daily as needed (for acid reflux).     pregabalin (LYRICA) 75 MG capsule Take 150 mg by mouth at bedtime.     propranolol (INDERAL) 40 MG tablet Take 40 mg by mouth every evening.     Rimegepant Sulfate (NURTEC) 75 MG TBDP Take 75 mg by mouth daily as needed. For migraines. Take as close to onset of migraine as possible. One daily maximum. 16 tablet 11   rosuvastatin (CRESTOR) 40 MG tablet Take 40 mg by mouth daily.     sertraline (ZOLOFT) 100 MG tablet Take 200 mg by mouth every morning.      venlafaxine XR (EFFEXOR-XR) 37.5 MG 24 hr capsule Take 37.5 mg by mouth at bedtime. for migraines     No current facility-administered medications for this visit.    Allergies as of 06/21/2023 - Review Complete 05/09/2023  Allergen Reaction Noted   Bee venom Anaphylaxis 11/20/2016   Codeine Other (See Comments) 09/20/2013   Oxycodone-acetaminophen Anxiety 12/13/2016    Vitals: There were no vitals taken for this visit. Last Weight:  Wt Readings from Last 1 Encounters:  03/19/23 173 lb 3.2 oz (78.6 kg)   Last Height:   Ht Readings from Last 1 Encounters:  03/19/23 5\' 2"  (1.575 m)    Physical exam: Exam: Gen: NAD, conversant      CV:  Denies palpitations or chest pain or SOB. VS: Breathing at a normal rate. Weight appears within normal limits. Not febrile. Eyes: Conjunctivae clear without exudates or hemorrhage  Neuro: Detailed Neurologic Exam  Speech:    Speech is normal; fluent and  spontaneous with normal comprehension.  Cognition:    The patient is oriented to person, place, and time;     recent and remote memory intact;     language fluent;     normal attention, concentration,     fund of knowledge Cranial Nerves:    The pupils are equal, round, and reactive to light. Visual fields are full to finger confrontation. Extraocular movements are intact.  The face is symmetric with normal sensation. The palate elevates in the midline. Hearing intact. Voice is normal. Shoulder shrug is normal. The tongue has normal motion without fasciculations.   Coordination:    Normal finger to nose  Gait:    Normal native gait  Motor Observation:   no involuntary movements noted. Tone:    Appears normal  Posture:    Posture is normal. normal erect    Strength:    Strength is anti-gravity and symmetric in the upper and lower limbs.      Sensation: intact to LT        Assessment/Plan:  67 y.o. female here for migraines.  She has a past medical history of hypertension, chronic migraine, depression, stroke.  MRI was 09/20/2022 and reported tiny acute infarcts in the posterior right temporal lobe and possibly another tiny acute/subacute infarct in the right frontal lobe.  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  LDL was 176 and A1c was 5.4.  afib found.  06/21/2023: Still loves the vyepti. She had some nurtec samples she take 2-3 a month. She can't afford it, it was $1600 for the month.  Bernita Raisin is expensive as well.  I wil find some nurtec samples and leave them up front. Cpap is doing well. Can't take triptans due to stroke will try to supply with samples.  Continue eqliquis Continue Lipitor to get LDL < 70 Manage blood pressure, no suggestion of diabetes (hgba1c 5.4): Continue Vyepti at 300mg   We found sleep apnea: OSA on cpap Triptans contraindicated Discussed: "There is  increased risk for stroke in women with migraine with aura and a contraindication for the combined contraceptive pill for use by women who have migraine with aura. The risk for women with migraine without aura is lower. However other risk factors like smoking are far more likely to increase stroke risk than migraine. There is a recommendation for no smoking and for the use of OCPs without estrogen such as progestogen only pills particularly for women with migraine with aura.Marland Kitchen People who have migraine headaches with auras may be 3 times more likely to have a stroke caused by a blood clot, compared to migraine patients who don't see auras. Women who take hormone-replacement therapy may be 30 percent more likely to suffer a clot-based stroke than women not taking medication containing estrogen. Other risk factors like smoking and high blood pressure may be  much more important."     Cc: Robin Inch, MD,  Robin Inch, MD  Naomie Dean, MD  [facility] [address] [place], [place] [address]  I spent over 70 minutes of face-to-face and non-face-to-face time with patient on the  1. Chronic migraine without aura, with intractable migraine, so stated, with status migrainosus     diagnosis.  This included previsit chart review, lab review, study review, order entry, electronic health record documentation, patient education on the different diagnostic and therapeutic options, counseling and coordination of care, risks and benefits of management, compliance, or risk factor reduction   Phone [phone] Fax [phone]

## 2023-06-21 NOTE — Telephone Encounter (Signed)
Sent mychart msg informing pt of follow up made with Dr. Ahern 

## 2023-06-25 ENCOUNTER — Encounter: Payer: Self-pay | Admitting: Neurology

## 2023-06-25 NOTE — Telephone Encounter (Signed)
Noted  

## 2023-07-02 ENCOUNTER — Telehealth: Payer: Self-pay | Admitting: Adult Health

## 2023-07-02 DIAGNOSIS — G4733 Obstructive sleep apnea (adult) (pediatric): Secondary | ICD-10-CM

## 2023-07-02 NOTE — Telephone Encounter (Signed)
error 

## 2023-07-03 NOTE — Telephone Encounter (Addendum)
I called patient. I discussed this with her. She was unable to get a new mask from [place] since her last visit, an order from [place] may be needed to move this along. Her husband has a CPAP and patient really likes the mask he has and will work with Christoper Allegra to obtain this mask. I advised patient to reach out to [facility] to get this process moving along if she has not heard from them in the next few days. I will also send an order to [employer]. Pt verbalized understanding and appreciation.  Mask refit order faxed to [facility]. Received a receipt of confirmation.

## 2023-07-03 NOTE — Telephone Encounter (Signed)
Please contact patient to let her know that I rechecked her CPAP download and AHI remains in the 6 range. I spoke with Dr. Frances Furbish who felt this level was fine for now but would benefit from working on still higher than normal leak rate. Can we see if she was able to get a new mask as discussed at prior visit? She may need to get back into her DME for mask refitting.  Thank you!

## 2023-07-03 NOTE — Addendum Note (Signed)
Addended by: Geronimo Running A on: 07/03/2023 09:14 AM   Modules accepted: Orders

## 2023-08-29 ENCOUNTER — Ambulatory Visit: Payer: Medicare Other | Admitting: Cardiology

## 2023-08-30 ENCOUNTER — Ambulatory Visit: Payer: Medicare Other | Admitting: Cardiology

## 2023-09-10 ENCOUNTER — Telehealth: Payer: Self-pay | Admitting: *Deleted

## 2023-09-10 NOTE — Telephone Encounter (Signed)
Received update from [facility]. Vyepti 100 mg received on 09/05/23 without complication. Next appt will be on 12/05/23.

## 2023-09-17 ENCOUNTER — Encounter: Payer: Self-pay | Admitting: Anesthesiology

## 2023-09-17 NOTE — Progress Notes (Unsigned)
[name] [address] [place]. [place] [address] [phone]       OFFICE FOLLOW UP NOTE  [name] Date of Birth:  1956/08/18 Medical Record Number:  [id number]   Reason for visit: CPAP follow-up  Virtual Visit via Video Note  I connected with Robin Robles on 09/18/23 at  8:15 AM EDT by a video enabled telemedicine application and verified that I am speaking with the correct person using two identifiers.  Location: Patient: at home Provider: in office   I discussed the limitations of evaluation and management by telemedicine and the availability of in person appointments. The patient expressed understanding and agreed to proceed.    SUBJECTIVE:  Follow up visit:  Prior visit: 03/19/2023  Brief HPI:   Robin Robles is a 67 y.o. female with PMH significant for ischemic stroke in 2023, migraines and HTN who was initially evaluated by Dr. Vickey Huger on 11/14/2022 for concern of underlying sleep apnea. ESS 16/24. FSS 47/63.  Completed sleep study 01/01/2023 which showed overall mild OSA with total AHI of 9.6/h and more pronounced during REM sleep with REM AHI 23.4/h, supine AHI 49.3/h and O2 nadir of 83%.  Recommend initiation of AutoPap.  Set up date 01/12/2023.   At prior visit, complained of difficulty tolerating nasal mask therefore order placed for change of interface. Also noted slightly elevated AHI at 6.2, AutoPap setting adjusted slightly from 6-11 to 6-12, no significant change on repeat download, further discussed with Dr. Frances Furbish (during Dr. Oliva Bustard absence) who did not feel this was concerning. ESS 8/24.    Interval history:  Returns for follow-up visit via MyChart video visit.  Compliance report shows excellent usage with residual AHI 6.4. did not receive new interface but she has been able to adjust to her nasal mask, no longer causing irritation. She falls asleep on her side but usually ends up on her back. She continues to feel  generally fatigued and no energy during the day, ESS 15/24. Does have difficulty falling asleep and will wake up frequently throughout the night, restless, kicks legs during sleep, will have BLE discomfort in the evening or prior to falling asleep, will feel like she has to move her legs, can have burning sensation, symptoms will improve if she gets up and moves.  She is currently on pregabalin 150 mg nightly, reports previously prescribed by provider for lower back pain now managed by PCP.  New DME starting 9/1 [facility] due to insurance (previously [employer]). Continues to follow with Dr. Lucia Gaskins for migraine management, remains on Vyepti and Nurtec, also reports being on venlafaxine 37.5mg  nightly for migraines (rx'd by PCP), migraines remain well controlled.              ROS:   14 system review of systems performed and negative with exception of those listed in HPI  PMH:  Past Medical History:  Diagnosis Date   Fibromyalgia    Hypertension    Migraines    Stroke (HCC)     PSH:  Past Surgical History:  Procedure Laterality Date   ABDOMINAL HYSTERECTOMY     CHOLECYSTECTOMY     sinus     THYROID SURGERY      Social History:  Social History   Socioeconomic History   Marital status: Married    Spouse name: Not on file   Number of children: 2   Years of education: Not on file   Highest education level: Not on file  Occupational History   Not on file  Tobacco Use   Smoking status: Never   Smokeless tobacco: Never  Vaping Use   Vaping status: Never Used  Substance and Sexual Activity   Alcohol use: No   Drug use: No   Sexual activity: Not Currently  Other Topics Concern   Not on file  Social History Narrative   Not on file   Social Determinants of Health   Financial Resource Strain: Low Risk  (03/01/2023)   Received from [facility], [facility]   Overall Financial Resource Strain (CARDIA)    Difficulty of Paying Living Expenses: Not hard at all   Food Insecurity: No Food Insecurity (03/01/2023)   Received from [facility], [facility]   Hunger Vital Sign    Worried About Running Out of Food in the Last Year: Never true    Ran Out of Food in the Last Year: Never true  Transportation Needs: No Transportation Needs (03/01/2023)   Received from [facility], [facility]   PRAPARE - Transportation    Lack of Transportation (Medical): No    Lack of Transportation (Non-Medical): No  Physical Activity: Insufficiently Active (03/01/2023)   Received [facility]   Exercise Vital Sign    Days of Exercise per Week: 7 days    Minutes of Exercise per Session: 20 min  Stress: Patient Declined (03/01/2023)   Received from [facility], [facility] - Occupational Stress Questionnaire    Feeling of Stress : Patient declined  Social Connections: Socially Integrated (03/01/2023)   Received from [facility], [facility]   Social Network    How would you rate your social network (family, work, friends)?: Good participation with social networks  Intimate Partner Violence: Not At Risk (03/01/2023)   Received from [facility], [facility]   HITS    Over the last 12 months how often did your partner physically hurt you?: 1    Over the last 12 months how often did your partner insult you or talk down to you?: 1    Over the last 12 months how often did your partner threaten you with physical harm?: 1    Over the last 12 months how often did your partner scream or curse at you?: 1    Family History:  Family History  Problem Relation Age of Onset   Heart failure Mother    Migraines Father    Heart failure Father    Migraines Sister    Migraines Brother     Medications:   Current Outpatient Medications on File Prior to Visit  Medication Sig Dispense Refill   apixaban (ELIQUIS) 5 MG TABS tablet Take by mouth.     buPROPion (WELLBUTRIN XL) 150 MG 24 hr tablet Take 150 mg by  mouth daily.     dicyclomine (BENTYL) 10 MG capsule Take 10 mg by mouth 4 (four) times daily -  before meals and at bedtime.     diltiazem (CARDIZEM) 30 MG tablet Take 1 tablet (30 mg total) by mouth 3 (three) times daily as needed. 90 tablet 6   EPINEPHrine 0.3 mg/0.3 mL IJ SOAJ injection Inject into the muscle.     Eptinezumab-jjmr (VYEPTI IV) Inject 100 mg into the vein every 3 (three) months. One dose, 3-4 wks ago     fluticasone (FLONASE) 50 MCG/ACT nasal spray Place 1 spray into both nostrils daily.     Multiple Vitamins-Minerals (PRESERVISION AREDS PO)  Take 1 tablet by mouth daily.     omeprazole (PRILOSEC) 20 MG capsule Take 20 mg by mouth daily as needed (for acid reflux).     pregabalin (LYRICA) 75 MG capsule Take 150 mg by mouth at bedtime.     propranolol (INDERAL) 40 MG tablet Take 40 mg by mouth every evening.     Rimegepant Sulfate (NURTEC) 75 MG TBDP Take 75 mg by mouth daily as needed. For migraines. Take as close to onset of migraine as possible. One daily maximum. 16 tablet 11   rosuvastatin (CRESTOR) 40 MG tablet Take 40 mg by mouth daily.     sertraline (ZOLOFT) 100 MG tablet Take 200 mg by mouth every morning.      venlafaxine XR (EFFEXOR-XR) 37.5 MG 24 hr capsule Take 37.5 mg by mouth at bedtime. for migraines     No current facility-administered medications on file prior to visit.    Allergies:   Allergies  Allergen Reactions   Bee Venom Anaphylaxis    Throat swelling  Throat swelling    Codeine Other (See Comments)    hallucinations Other reaction(s): Delusions (intolerance), Hallucinations   Oxycodone-Acetaminophen Anxiety    Other reaction(s): Other, Other (See Comments) hyper hyper       OBJECTIVE:  Physical Exam General: well developed, well nourished, very pleasant middle-age Caucasian female, seated, in no evident distress        ASSESSMENT/PLAN: Robin Robles is a 67 y.o. year old female    OSA on CPAP :  Compliance report shows  satisfactory usage with residual AHI 6.4. discussed avoidance of supine sleep as sleep study showed higher supine AHI, discussed use of wedge pillow if needed. Will continue with current pressure setting for now.  Discussed importance of continued nightly usage with greater than 4 hours nightly for optimal benefit.  Continue to follow with DME, now [facility] for any needed supplies or CPAP related concerns. EDS Possible RLS/PLMD Insomnia Discussed increasing pregabalin dosage from 150mg  to 200mg  nightly for possible RLS/PLMD which could be contributing for EDS and insomnia/sleep arousals but will defer to PCP who is currently prescribing. Could consider trial of Requip if no benefit but patient wishes to avoid adding any medications at this time Recommend trying venlafaxine in the morning as this could potentially contribute to insomnia although is on a low-dose      Follow up in 1 year or call earlier if needed    CC:  PCP: Dani Gobble, PA-C    I spent 30 minutes of face-to-face and non-face-to-face time with patient via MyChart video visit.  This included previsit chart review, lab review, study review, order entry, electronic health record documentation, patient education regarding diagnosis of sleep apnea with review and discussion of compliance report and answered all other questions to patient's satisfaction   Ihor Austin, [facility]  [facility] [address] [place], [place] [address]  Phone [phone] Fax [phone] Note: This document was prepared with digital dictation and possible smart phrase technology. Any transcriptional errors that result from this process are unintentional.

## 2023-09-18 ENCOUNTER — Encounter: Payer: Self-pay | Admitting: Adult Health

## 2023-09-18 ENCOUNTER — Telehealth (INDEPENDENT_AMBULATORY_CARE_PROVIDER_SITE_OTHER): Payer: Medicare Other | Admitting: Adult Health

## 2023-09-18 DIAGNOSIS — G4733 Obstructive sleep apnea (adult) (pediatric): Secondary | ICD-10-CM

## 2023-09-18 DIAGNOSIS — G4719 Other hypersomnia: Secondary | ICD-10-CM | POA: Diagnosis not present

## 2023-09-18 DIAGNOSIS — G4700 Insomnia, unspecified: Secondary | ICD-10-CM

## 2023-09-18 NOTE — Patient Instructions (Addendum)
Continue nightly use of CPAP for sleep apnea management  Avoid supine sleep as this could be contributing to slightly elevated residual apneas  Recommend discussing increasing pregabalin dosage of 200 mg nightly for possible RLS/PLMD which may be contributing to continued excessive daytime sleepiness, insomnia and sleep arousals  Try taking venlafaxine in the morning as this medication can potentially be contributing to your insomnia    Follow-up in 1 year or call earlier if needed

## 2023-09-20 ENCOUNTER — Telehealth: Payer: Self-pay | Admitting: Adult Health

## 2023-09-20 MED ORDER — NURTEC 75 MG PO TBDP: ORAL_TABLET | ORAL | 0 refills | Status: DC

## 2023-09-20 NOTE — Telephone Encounter (Signed)
Per Dr Lucia Gaskins last VV with patient  . Ok to give Nurtec Samples . Samples  at front desk with Patient assistance Program form for Nurtec

## 2023-09-20 NOTE — Addendum Note (Signed)
Addended by: Raynald Kemp A on: 09/20/2023 02:06 PM   Modules accepted: Orders

## 2023-09-20 NOTE — Telephone Encounter (Signed)
Pt came in today to pick up Nurtec samples, pod 3 let me know we are out. Sent mychart message relaying info to patient.

## 2023-10-09 ENCOUNTER — Ambulatory Visit: Payer: Medicare Other | Admitting: Cardiology

## 2023-11-08 NOTE — Telephone Encounter (Signed)
Patient never came to pick up her samples of Nurtec. Will be restocked in medicine closet.

## 2024-01-24 NOTE — Progress Notes (Signed)
[facility] - Clinic Note  02/05/2024     CHIEF COMPLAINT Patient presents for Retina Follow Up    HISTORY OF PRESENT ILLNESS: Robin Robles is a 68 y.o. female who presents to the clinic today for:   HPI     Retina Follow Up   Patient presents with  Dry AMD.  In both eyes.  This started 9 months ago.  I, the attending physician,  performed the HPI with the patient and updated documentation appropriately.        Comments   Patient here for 9 months retina follow up for non exu ARMD OU. Patient state vision is okay. No eye pain. Had a sinus infection taking antibiotics for.       Last edited by Rennis Chris, MD on 02/05/2024  4:01 PM.    Pt states in the dark she sees "all this pattern on stuff" in neon colors, she states it's almost like electrical charges, she can see throw them, it doesn't affect her vision or keep her from doing things, she is taking Ocuvite, but is not sure it's AREDS 2  Referring physician: Sallye Lat, MD [address] [place],  [place] [address]  HISTORICAL INFORMATION:   Selected notes from the MEDICAL RECORD NUMBER Referred by Dr. Marchelle Gearing for eval of ARMD OU   CURRENT MEDICATIONS: No current outpatient medications on file. (Ophthalmic Drugs)   No current facility-administered medications for this visit. (Ophthalmic Drugs)   Current Outpatient Medications (Other)  Medication Sig   buPROPion (WELLBUTRIN XL) 150 MG 24 hr tablet Take 150 mg by mouth daily.   dicyclomine (BENTYL) 10 MG capsule Take 10 mg by mouth 4 (four) times daily -  before meals and at bedtime.   diltiazem (CARDIZEM) 30 MG tablet Take 1 tablet (30 mg total) by mouth 3 (three) times daily as needed.   EPINEPHrine 0.3 mg/0.3 mL IJ SOAJ injection Inject into the muscle.   Eptinezumab-jjmr (VYEPTI IV) Inject 100 mg into the vein every 3 (three) months. One dose, 3-4 wks ago   fluticasone (FLONASE) 50 MCG/ACT nasal spray Place 1 spray into  both nostrils daily.   Multiple Vitamins-Minerals (PRESERVISION AREDS PO) Take 1 tablet by mouth daily.   omeprazole (PRILOSEC) 20 MG capsule Take 20 mg by mouth daily as needed (for acid reflux).   pregabalin (LYRICA) 75 MG capsule Take 150 mg by mouth at bedtime.   propranolol (INDERAL) 40 MG tablet Take 40 mg by mouth every evening.   Rimegepant Sulfate (NURTEC) 75 MG TBDP Take 75 mg by mouth daily as needed. For migraines. Take as close to onset of migraine as possible. One daily maximum.   Rimegepant Sulfate (NURTEC) 75 MG TBDP Take 1 tablet onset of Migraine . Don't exceed one tablet Daily   rosuvastatin (CRESTOR) 40 MG tablet Take 40 mg by mouth daily.   sertraline (ZOLOFT) 100 MG tablet Take 200 mg by mouth every morning.    venlafaxine XR (EFFEXOR-XR) 37.5 MG 24 hr capsule Take 37.5 mg by mouth at bedtime. for migraines   apixaban (ELIQUIS) 5 MG TABS tablet Take by mouth.   No current facility-administered medications for this visit. (Other)   REVIEW OF SYSTEMS: ROS   Positive for: Neurological, Musculoskeletal, Eyes Negative for: Constitutional, Gastrointestinal, Skin, Genitourinary, HENT, Endocrine, Cardiovascular, Respiratory, Psychiatric, Allergic/Imm, Heme/Lymph Last edited by Laddie Aquas, COA on 02/05/2024 12:48 PM.     ALLERGIES Allergies  Allergen Reactions  Bee Venom Anaphylaxis    Throat swelling  Throat swelling    Codeine Other (See Comments)    hallucinations Other reaction(s): Delusions (intolerance), Hallucinations   Oxycodone-Acetaminophen Anxiety    Other reaction(s): Other, Other (See Comments) hyper hyper    PAST MEDICAL HISTORY Past Medical History:  Diagnosis Date   Fibromyalgia    Hypertension    Migraines    Stroke [facility])    Past Surgical History:  Procedure Laterality Date   ABDOMINAL HYSTERECTOMY     CHOLECYSTECTOMY     sinus     THYROID SURGERY     FAMILY HISTORY Family History  Problem Relation Age of Onset   Heart failure  Mother    Migraines Father    Heart failure Father    Migraines Sister    Migraines Brother    SOCIAL HISTORY Social History   Tobacco Use   Smoking status: Never   Smokeless tobacco: Never  Vaping Use   Vaping status: Never Used  Substance Use Topics   Alcohol use: No   Drug use: No       OPHTHALMIC EXAM: Base Eye Exam     Visual Acuity (Snellen - Linear)       Right Left   Dist cc 20/20 20/25 -2   Dist ph cc  NI    Correction: Glasses         Tonometry (Tonopen, 12:46 PM)       Right Left   Pressure 15 14         Pupils       Dark Light Shape React APD   Right 4 3 Round Brisk None   Left 4 3 Round Brisk None         Visual Fields (Counting fingers)       Left Right    Full Full         Extraocular Movement       Right Left    Full, Ortho Full, Ortho         Neuro/Psych     Oriented x3: Yes   Mood/Affect: Normal         Dilation     Both eyes: 1.0% Mydriacyl, 2.5% Phenylephrine @ 12:46 PM           Slit Lamp and Fundus Exam     External Exam       Right Left   External Normal Normal         Slit Lamp Exam       Right Left   Lids/Lashes Dermatochalasis - upper lid Dermatochalasis - upper lid, mild MGD   Conjunctiva/Sclera White and quiet White and quiet   Cornea arcus, well healed cataract wound arcus, well healed cataract wound   Anterior Chamber deep and clear deep and clear   Iris Round and dilated Round and dilated   Lens PC IOL in good position, 1-2+Posterior capsular opacification PC IOL in good position, trace Posterior capsular opacification   Anterior Vitreous Vitreous syneresis, Posterior vitreous detachment mild syneresis, Posterior vitreous detachment         Fundus Exam       Right Left   Disc mild pallor, sharp rim, mild PPA, +cupping Pink and Sharp, mild PPA/PPP   C/D Ratio 0.75 0.7   Macula Flat, Good foveal reflex, fine Drusen, mild RPE mottling, No heme or edema Flat, Good foveal reflex,  Drusen, RPE mottling and clumping, No heme or edema   Vessels attenuated, mild  tortuosity attenuated, Tortuous   Periphery Attached, focal peripheral drusen at 0300, no heme Attached, No heme, no RT/RD           Refraction     Wearing Rx       Sphere Cylinder Axis Add   Right [place] +1.25 110 +2.50   Left [place] +1.00 097 +2.50           IMAGING AND PROCEDURES  Imaging and Procedures for 02/05/2024  OCT, Retina - OU - Both Eyes       Right Eye Quality was good. Central Foveal Thickness: 277. Progression has been stable. Findings include normal foveal contour, no IRF, no SRF, retinal drusen .   Left Eye Quality was good. Central Foveal Thickness: 272. Progression has been stable. Findings include normal foveal contour, no IRF, no SRF, retinal drusen (stable release of Partial PVD).   Notes *Images captured and stored on drive  Diagnosis / Impression:  Non-exu ARMD OU -- Stable drusen, no fluid OU NFP, no IRF/SRF OU OS: stable release of partial PVD  Clinical management:  See below  Abbreviations: NFP - Normal foveal profile. CME - cystoid macular edema. PED - pigment epithelial detachment. IRF - intraretinal fluid. SRF - subretinal fluid. EZ - ellipsoid zone. ERM - epiretinal membrane. ORA - outer retinal atrophy. ORT - outer retinal tubulation. SRHM - subretinal hyper-reflective material. IRHM - intraretinal hyper-reflective material             ASSESSMENT/PLAN:    ICD-10-CM   1. Intermediate stage nonexudative age-related macular degeneration of both eyes  H35.3132 OCT, Retina - OU - Both Eyes    2. Posterior vitreous detachment of both eyes  H43.813     3. 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 Essential hypertension  I10     5. Hypertensive retinopathy of both eyes  H35.033     6. Pseudophakia, both eyes  Z96.1      1. Age related macular degeneration, non-exudative, OU  - intermediate stage -- stable  - no exudative disease on exam or OCT  - The  incidence, anatomy, and pathology of dry AMD, risk of progression, and the AREDS and AREDS 2 studies including smoking risks discussed with patient.  - Continue amsler grid monitoring  - f/u 9 months, sooner prn -- DFE, OCT  2. PVD / vitreous syneresis OU  - acute PVD OS w/ symptomatic floaters -- onset early May 2024  - Discussed findings and prognosis  - No RT or RD on 360 peripheral exam  - Reviewed s/s of RT/RD  - Strict return precautions for any such RT/RD signs/symptoms  - monitor  3. Ocular migraine v migraine w/ aura  - pt with known history of migraines w/ aura, but auras have changed from wavy lines and colors to a symmetrical object with black lines emanating from it  - eye exam without ophthalmic findings to explain symptoms -- making migraine mechanism more likely  - no retinal or ophthalmic interventions indicated or recommended  - recommend discussing with neurologist for further recommendations and input  4,5. Hypertensive retinopathy OU - discussed importance of tight BP control - monitor  6. Pseudophakia OU  - s/p CE/IOL OU (Dr. Zetta Bills)  - IOLs in good position, doing well  - monitor  Ophthalmic Meds Ordered this visit:  No orders of the defined types were placed in this encounter.    Return in about 9 months (around 11/04/2024) for f/u non-exu ARMD OU,  DFE, OCT.  There are no Patient Instructions on file for this visit.   Explained the diagnoses, plan, and follow up with the patient and they expressed understanding.  Patient expressed understanding of the importance of proper follow up care.   This document serves as a record of services personally performed by Karie Chimera, MD, PhD. It was created on their behalf by Charlette Caffey, COT an ophthalmic technician. The creation of this record is the provider's dictation and/or activities during the visit.    Electronically signed by:  Charlette Caffey, COT  02/05/24 4:02 PM  This document serves  as a record of services personally performed by Karie Chimera, MD, PhD. It was created on their behalf by Glee Arvin. Manson Passey, OA an ophthalmic technician. The creation of this record is the provider's dictation and/or activities during the visit.    Electronically signed by: Glee Arvin. Manson Passey, OA 02/05/24 4:02 PM  Karie Chimera, M.D., Ph.D. Diseases & Surgery of the Retina and Vitreous [facility]  I have reviewed the above documentation for accuracy and completeness, and I agree with the above. Karie Chimera, M.D., Ph.D. 02/05/24 4:02 PM   Abbreviations: M myopia (nearsighted); A astigmatism; H hyperopia (farsighted); P presbyopia; Mrx spectacle prescription;  CTL contact lenses; OD right eye; OS left eye; OU both eyes  XT exotropia; ET esotropia; PEK punctate epithelial keratitis; PEE punctate epithelial erosions; DES dry eye syndrome; MGD meibomian gland dysfunction; ATs artificial tears; PFAT's preservative free artificial tears; NSC nuclear sclerotic cataract; PSC posterior subcapsular cataract; ERM epi-retinal membrane; PVD posterior vitreous detachment; RD retinal detachment; DM diabetes mellitus; DR diabetic retinopathy; NPDR non-proliferative diabetic retinopathy; PDR proliferative diabetic retinopathy; CSME clinically significant macular edema; DME diabetic macular edema; dbh dot blot hemorrhages; CWS cotton wool spot; POAG primary open angle glaucoma; C/D cup-to-disc ratio; HVF humphrey visual field; GVF goldmann visual field; OCT optical coherence tomography; IOP intraocular pressure; BRVO Branch retinal vein occlusion; CRVO central retinal vein occlusion; CRAO central retinal artery occlusion; BRAO branch retinal artery occlusion; RT retinal tear; SB scleral buckle; PPV pars plana vitrectomy; VH Vitreous hemorrhage; PRP panretinal laser photocoagulation; IVK intravitreal kenalog; VMT vitreomacular traction; MH Macular hole;  NVD neovascularization of the disc; NVE  neovascularization elsewhere; AREDS age related eye disease study; ARMD age related macular degeneration; POAG primary open angle glaucoma; EBMD epithelial/anterior basement membrane dystrophy; ACIOL anterior chamber intraocular lens; IOL intraocular lens; PCIOL posterior chamber intraocular lens; Phaco/IOL phacoemulsification with intraocular lens placement; PRK photorefractive keratectomy; LASIK laser assisted in situ keratomileusis; HTN hypertension; DM diabetes mellitus; COPD chronic obstructive pulmonary disease

## 2024-02-01 ENCOUNTER — Other Ambulatory Visit: Payer: Self-pay | Admitting: Internal Medicine

## 2024-02-05 ENCOUNTER — Ambulatory Visit (INDEPENDENT_AMBULATORY_CARE_PROVIDER_SITE_OTHER): Payer: Medicare Other | Admitting: Ophthalmology

## 2024-02-05 ENCOUNTER — Encounter (INDEPENDENT_AMBULATORY_CARE_PROVIDER_SITE_OTHER): Payer: Self-pay | Admitting: Ophthalmology

## 2024-02-05 DIAGNOSIS — Z961 Presence of intraocular lens: Secondary | ICD-10-CM

## 2024-02-05 DIAGNOSIS — G43109 Migraine with aura, not intractable, without status migrainosus: Secondary | ICD-10-CM | POA: Diagnosis not present

## 2024-02-05 DIAGNOSIS — H353132 Nonexudative age-related macular degeneration, bilateral, intermediate dry stage: Secondary | ICD-10-CM | POA: Diagnosis not present

## 2024-02-05 DIAGNOSIS — I10 Essential (primary) hypertension: Secondary | ICD-10-CM | POA: Diagnosis not present

## 2024-02-05 DIAGNOSIS — H43813 Vitreous degeneration, bilateral: Secondary | ICD-10-CM

## 2024-02-05 DIAGNOSIS — H35033 Hypertensive retinopathy, bilateral: Secondary | ICD-10-CM

## 2024-02-15 ENCOUNTER — Other Ambulatory Visit (HOSPITAL_COMMUNITY): Payer: Self-pay

## 2024-02-15 ENCOUNTER — Telehealth: Payer: Self-pay

## 2024-02-15 NOTE — Telephone Encounter (Signed)
 Pharmacy Patient Advocate Encounter   Received notification from CoverMyMeds that prior authorization for Nurtec 75MG  dispersible tablets is required/requested. Insurance verification completed.   The patient is insured through [facility] .   Per test claim: The current 30 day co-pay is, $478.00.  No PA needed at this time. This test claim was processed through [facility]- copay amounts may vary at other pharmacies due to pharmacy/plan contracts, or as the patient moves through the different stages of their insurance plan.    Current PA good thru 12/24/2024

## 2024-03-05 NOTE — Telephone Encounter (Signed)
 Received update from [facility]. Vyepti 100 mg received on 03/04/24 without complication. Next appt will be on 06/02/24.

## 2024-06-23 ENCOUNTER — Ambulatory Visit: Payer: Medicare Other | Admitting: Neurology

## 2024-08-20 ENCOUNTER — Telehealth: Payer: Self-pay | Admitting: Adult Health

## 2024-08-20 NOTE — Telephone Encounter (Signed)
 Patient said pre approval for infusion scheduled 09/02/24 was withdrawn due to cancellation of Dr. Sharion appointment 06/23/24. Would like a call from the nurse.

## 2024-08-20 NOTE — Telephone Encounter (Signed)
 Thought was suppose to send to PA Team.  Resending to POD3 Patient said pre approval for infusion scheduled 09/02/24 was withdrawn due to cancellation of Dr. Sharion appointment 06/23/24. Would like a call from the nurse.

## 2024-09-16 NOTE — Progress Notes (Unsigned)
 [name] [address] [place]. [place] [address] [phone]       OFFICE FOLLOW UP NOTE  [name] Date of Birth:  Jan 01, 1956 Medical Record Number:  [id number]   Reason for visit: CPAP follow-up  Virtual Visit via Video Note  I connected with Robin Robles on 09/16/24 at  8:15 AM EDT by a video enabled telemedicine application and verified that I am speaking with the correct person using two identifiers.  Location: Patient: at home Provider: in office   I discussed the limitations of evaluation and management by telemedicine and the availability of in person appointments. The patient expressed understanding and agreed to proceed.    SUBJECTIVE:  Follow up visit:  Prior visit: 03/19/2023  Brief HPI:   Robin Robles is a 68 y.o. female with PMH significant for ischemic stroke in 2023, migraines and HTN who was initially evaluated by Dr. Chalice on 11/14/2022 for concern of underlying sleep apnea. ESS 16/24. FSS 47/63.  Completed sleep study 01/01/2023 which showed overall mild OSA with total AHI of 9.6/h and more pronounced during REM sleep with REM AHI 23.4/h, supine AHI 49.3/h and O2 nadir of 83%.  Recommend initiation of AutoPap.  Set up date 01/12/2023.      Interval history:  Returns for follow-up visit via MyChart video visit.  Compliance report shows excellent usage with residual AHI 6.4. did not receive new interface but she has been able to adjust to her nasal mask, no longer causing irritation. She falls asleep on her side but usually ends up on her back. She continues to feel generally fatigued and no energy during the day, ESS 15/24. Does have difficulty falling asleep and will wake up frequently throughout the night, restless, kicks legs during sleep, will have BLE discomfort in the evening or prior to falling asleep, will feel like she has to move her legs, can have burning sensation, symptoms will improve if she gets up and  moves.  She is currently on pregabalin  150 mg nightly, reports previously prescribed by provider for lower back pain now managed by PCP.  New DME starting 9/1 [facility] due to insurance (previously [employer]).   Continues to follow with Dr. Ines for migraine management, remains on Vyepti and Nurtec, also reports being on venlafaxine  37.5mg  nightly for migraines (rx'd by PCP), migraines remain well controlled.              ROS:   14 system review of systems performed and negative with exception of those listed in HPI  PMH:  Past Medical History:  Diagnosis Date   Fibromyalgia    Hypertension    Migraines    Stroke (HCC)     PSH:  Past Surgical History:  Procedure Laterality Date   ABDOMINAL HYSTERECTOMY     CHOLECYSTECTOMY     sinus     THYROID SURGERY      Social History:  Social History   Socioeconomic History   Marital status: Married    Spouse name: Not on file   Number of children: 2   Years of education: Not on file   Highest education level: Not on file  Occupational History   Not on file  Tobacco Use   Smoking status: Never   Smokeless tobacco: Never  Vaping Use   Vaping status: Never Used  Substance and Sexual Activity   Alcohol use: No   Drug use: No   Sexual activity: Not Currently  Other Topics  Concern   Not on file  Social History Narrative   Not on file   Social Drivers of Health   Financial Resource Strain: Low Risk  (09/09/2024)   Received from [facility]   Overall Financial Resource Strain (CARDIA)    Difficulty of Paying Living Expenses: Not very hard  Food Insecurity: No Food Insecurity (09/09/2024)   Received from [facility]   Hunger Vital Sign    Within the past 12 months, you worried that your food would run out before you got the money to buy more.: Never true    Within the past 12 months, the food you bought just didn't last and you didn't have money to get more.: Never true   Transportation Needs: No Transportation Needs (09/09/2024)   Received from [facility] - Transportation    In the past 12 months, has lack of transportation kept you from medical appointments or from getting medications?: No    Lack of Transportation (Non-Medical): No  Physical Activity: Insufficiently Active (04/27/2024)   Received from [facility]   Exercise Vital Sign    On average, how many days per week do you engage in moderate to strenuous exercise (like a brisk walk)?: 3 days    On average, how many minutes do you engage in exercise at this level?: 10 min  Stress: No Stress Concern Present (04/27/2024)   Received from [facility] - Occupational Stress Questionnaire    Feeling of Stress : Not at all  Social Connections: Socially Integrated (04/27/2024)   Received from [facility]   Social Network    How would you rate your social network (family, work, friends)?: Good participation with social networks  Intimate Partner Violence: Not At Risk (04/27/2024)   Received from [facility]   HITS    Over the last 12 months how often did your partner physically hurt you?: Never    Over the last 12 months how often did your partner insult you or talk down to you?: Never    Over the last 12 months how often did your partner threaten you with physical harm?: Never    Over the last 12 months how often did your partner scream or curse at you?: Never    Family History:  Family History  Problem Relation Age of Onset   Heart failure Mother    Migraines Father    Heart failure Father    Migraines Sister    Migraines Brother     Medications:   Current Outpatient Medications on File Prior to Visit  Medication Sig Dispense Refill   apixaban  (ELIQUIS ) 5 MG TABS tablet Take by mouth.     buPROPion  (WELLBUTRIN  XL) 150 MG 24 hr tablet Take 150 mg by mouth daily.     dicyclomine  (BENTYL ) 10 MG capsule Take 10 mg by mouth 4  (four) times daily -  before meals and at bedtime.     diltiazem  (CARDIZEM ) 30 MG tablet Take 1 tablet (30 mg total) by mouth 3 (three) times daily as needed. 90 tablet 6   EPINEPHrine 0.3 mg/0.3 mL IJ SOAJ injection Inject into the muscle.     Eptinezumab-jjmr (VYEPTI IV) Inject 100 mg into the vein every 3 (three) months. One dose, 3-4 wks ago     fluticasone  (FLONASE) 50 MCG/ACT nasal spray Place 1 spray into both nostrils daily.     Multiple Vitamins-Minerals (PRESERVISION AREDS PO)  Take 1 tablet by mouth daily.     omeprazole  (PRILOSEC) 20 MG capsule Take 20 mg by mouth daily as needed (for acid reflux).     pregabalin  (LYRICA ) 75 MG capsule Take 150 mg by mouth at bedtime.     propranolol (INDERAL) 40 MG tablet Take 40 mg by mouth every evening.     Rimegepant Sulfate (NURTEC) 75 MG TBDP Take 75 mg by mouth daily as needed. For migraines. Take as close to onset of migraine as possible. One daily maximum. 16 tablet 11   Rimegepant Sulfate (NURTEC) 75 MG TBDP Take 1 tablet onset of Migraine . Don't exceed one tablet Daily 8 tablet 0   rosuvastatin  (CRESTOR ) 40 MG tablet Take 40 mg by mouth daily.     sertraline  (ZOLOFT ) 100 MG tablet Take 200 mg by mouth every morning.      venlafaxine  XR (EFFEXOR -XR) 37.5 MG 24 hr capsule Take 37.5 mg by mouth at bedtime. for migraines     No current facility-administered medications on file prior to visit.    Allergies:   Allergies  Allergen Reactions   Bee Venom Anaphylaxis    Throat swelling  Throat swelling    Codeine Other (See Comments)    hallucinations Other reaction(s): Delusions (intolerance), Hallucinations   Oxycodone-Acetaminophen  Anxiety    Other reaction(s): Other, Other (See Comments) hyper hyper       OBJECTIVE:  Physical Exam General: well developed, well nourished, very pleasant middle-age Caucasian female, seated, in no evident distress        ASSESSMENT/PLAN: Robin Robles is a 68 y.o. year old  female    OSA on CPAP :  Compliance report shows satisfactory usage with residual AHI 7.9. discussed avoidance of supine sleep as sleep study showed higher supine AHI, discussed use of wedge pillow if needed.  Due to slightly suboptimal residual AHI, will adjust pressure setting from 6-12 to 6-15 with EPR 3.   Discussed importance of continued nightly usage with greater than 4 hours nightly for optimal benefit.   Continue to follow with DME, now [facility], for any needed supplies or CPAP related concerns. CPAP set up 12/2022  Chronic migraines: Followed by Dr. 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 Could consider trial of Requip if no benefit but patient wishes to avoid adding any medications at this time Recommend trying venlafaxine  in the morning as this could potentially contribute to insomnia although is on a low-dose      Follow up in 1 year or call earlier if needed    CC:  PCP: Jacques Camie Pepper, PA-C    I personally spent a total of *** minutes in the care of the patient today including {Time Based Coding:210964241}.    Harlene Bogaert, AGNP-BC  [facility] [address] [place], [place] [address]  Phone [phone] Fax [phone] Note: This document was prepared with digital dictation and possible smart phrase technology. Any transcriptional errors that result from this process are unintentional.

## 2024-09-17 ENCOUNTER — Encounter: Payer: Self-pay | Admitting: Adult Health

## 2024-09-17 ENCOUNTER — Telehealth (INDEPENDENT_AMBULATORY_CARE_PROVIDER_SITE_OTHER): Payer: Medicare Other | Admitting: Adult Health

## 2024-09-17 DIAGNOSIS — G43709 Chronic migraine without aura, not intractable, without status migrainosus: Secondary | ICD-10-CM

## 2024-09-17 DIAGNOSIS — G4733 Obstructive sleep apnea (adult) (pediatric): Secondary | ICD-10-CM

## 2024-09-17 DIAGNOSIS — G43711 Chronic migraine without aura, intractable, with status migrainosus: Secondary | ICD-10-CM

## 2024-10-21 NOTE — Progress Notes (Signed)
 Referring Provider: Dewane Shiner, Do [address] [place],  [place] [address]  Primary Care Provider: Franchot Domino, MD No address on file   Chief Complaint Chief Complaint  Patient presents with  . Follow-up    Patient c.o SOB.      Patient Identification and History of Present Illness  Robin Robles is a 68 y.o. female with a history of HTN, CVA, NSVT, OSA, and Afib.  Afib was documented on monitor in care everywhere but was short in duration.  Per patient this her symptoms are from Afib and feel like what she experiences when told previously she was in Afib.  Per her [facility] and [place] have both tried to put her on OAC presumably bc of CHADSVASC score and documented Afib although short.  She has not started it bc of expense.  Today we discussed option for [facility] and cheaper Pradaxa. We also discussed that documented Afib was short and her elevated CHADSVASC.  She would like to start and investigate Afib further. We also discussed options for dealing with Afib and the risk and benefits including 1. No further action, 2. AADs, and PVI.  All options include OAC.  Given that her Afib documentation was short we discussed further monitoring to make sure we are treating the appropriate arrhythmia causing symptoms as it might be her Afib and it might be NSVT or PVCs, also documented, causing her symptoms.   Current visit: Monitor with no Afib but Short SVT 15 seconds, NSVT a few beats. SVE and VE both less than 1%.  Echo normal structure and function, some diastolic dysfunction.  She did not feel her symptoms she equates to Afib during the monitor but did feel palpitations.      Problem List    Patient Active Problem List  Diagnosis  . Acute CVA (cerebrovascular accident) (CMS/HHS-HCC)  . Atrial fibrillation (CMS/HHS-HCC)  . Essential hypertension  . Mixed hyperlipidemia  . NSVT (nonsustained ventricular tachycardia) (CMS/HHS-HCC)  . Obstructive sleep  apnea on CPAP      Allergies    Allergies  Allergen Reactions  . Venom-Honey Bee Anaphylaxis    Throat swelling   Throat swelling   Throat swelling   Throat swelling   Throat swelling  . Codeine Other (See Comments) and Hallucination    Other reaction(s): Other (See Comments)  hallucinations  Other reaction(s): Delusions (intolerance), Hallucinations  Other reaction(s): halucinations  hallucinations  Other reaction(s): Delusions (intolerance), Hallucinations  . Oxycodone Hcl Anxiety     Medications    Current Outpatient Medications  Medication Sig Dispense Refill  . buPROPion  (WELLBUTRIN  XL) 150 MG XL tablet Take 1 tablet by mouth every morning    . dabigatran (PRADAXA) 150 mg capsule Take 1 capsule (150 mg total) by mouth 2 (two) times daily 60 capsule 11  . dicyclomine  (BENTYL ) 20 mg tablet Take 20 mg by mouth 3 (three) times daily as needed    . multivitamin tablet Take 1 tablet by mouth once daily    . omeprazole  (PRILOSEC) 20 MG DR capsule Take 1 capsule by mouth once daily    . pregabalin  (LYRICA ) 75 MG capsule Take 75 mg by mouth 2 (two) times daily    . propranoloL (INDERAL) 40 MG tablet Take 40 mg by mouth every evening    . rimegepant (NURTEC ODT) 75 mg disintegrating tablet Take by mouth once    . rosuvastatin  (CRESTOR ) 40 MG tablet Take 1 tablet by mouth once daily    .  sertraline  (ZOLOFT ) 100 MG tablet Take 200 mg by mouth once daily    . venlafaxine  (EFFEXOR -XR) 37.5 MG XR capsule Take 1 capsule by mouth once daily    . dilTIAZem  (CARDIZEM  CD) 180 MG CD capsule Take 1 capsule (180 mg total) by mouth once daily 30 capsule 11   No current facility-administered medications for this visit.     Social History    Social History   Socioeconomic History  . Marital status: Married  Tobacco Use  . Smoking status: Never  . Smokeless tobacco: Never  Vaping Use  . Vaping status: Never Used  Substance and Sexual Activity  . Alcohol use: Not Currently   . Drug use: Never   Social Drivers of Corporate Investment Banker Strain: Low Risk  (09/09/2024)   Overall Financial Resource Strain (CARDIA)   . Difficulty of Paying Living Expenses: Not very hard  Food Insecurity: No Food Insecurity (09/09/2024)   Hunger Vital Sign   . Worried About Programme Researcher, Broadcasting/film/video in the Last Year: Never true   . Ran Out of Food in the Last Year: Never true  Transportation Needs: No Transportation Needs (09/09/2024)   PRAPARE - Transportation   . Lack of Transportation (Medical): No   . Lack of Transportation (Non-Medical): No  Physical Activity: Insufficiently Active (04/27/2024)   Received from [facility]   Exercise Vital Sign   . On average, how many days per week do you engage in moderate to strenuous exercise (like a brisk walk)?: 3 days   . On average, how many minutes do you engage in exercise at this level?: 10 min  Stress: No Stress Concern Present (04/27/2024)   Received from [facility] - Occupational Stress Questionnaire   . Feeling of Stress : Not at all  Social Connections: Socially Integrated (04/27/2024)   Received from [facility]   Social Network   . How would you rate your social network (family, work, friends)?: Good participation with social networks  Housing Stability: Low Risk  (09/09/2024)   Housing Stability Vital Sign   . Unable to Pay for Housing in the Last Year: No   . Number of Times Moved in the Last Year: 0   . Homeless in the Last Year: No     Family History    No family history on file.   Review of Systems     GENERAL HEALTH/CONSTITUTIONAL []  Appetite change []  Fevers or chills []  Fatigue []  Unexpected weight gain/loss []  Reduced activity  EYES, EARS, NOSE, THROAT []  Vision changes (blurred, double vision) []  Hearing changes / loss []  Tinnitus / ringing in ears []  Dental problem (dentition / gums) []  Vertigo / dizziness []  Nose bleeds []  Voice changes []   Trouble swallowing  RESPIRATORY []  Cough [x]  Shortness of breath []  Sleep apnea []  Snoring []  Sputum []  Wheezing  CARDIOVASCULAR []  Chest pain, pressure, or tightness [x]  Palpitations/heart flutters []  Near passing out []  Passing out []  Leg / ankle swelling []  Waking up short of breath []  Sleeping on an incline/more than one pillow []  Leg pain / cramps with walking  GASTROINTESTINAL []  Abdominal bloating/swelling []  Abdominal pain []  Blood on toilet tissue, in bowl, in stool []  Black, tarry stool []  Constipation []  Diarrhea []  Nausea []  Vomiting []  Heartburn symptoms URINARY []  Blood in urine []  Excessive urination []  Frequent urination []  Difficulty urinating []  Flank pain / painful urination []  Excess sweating / thirst []  Nocturia need  to urinate more then 1 time at night []  Decreased amount of urine  MUSCULOSKELETAL [x]  Joint aches/pains []  Swollen joints []  Back pain []  Muscle aches / pains  SKIN []  Color change []  Rash []  Wound  NEUROLOGIC []  Memory Changes []  Tremors / problems with balance []  Weakness []  Tingling / paresthesias []  Numbness []  Headaches []  Speech difficulty  HEMATOLOGIC []  History of blood clots []  Bruises/bleeds easily  PSYCHIATRIC []  Anxiety / Stress []  Depression []  Sleep disturbance  ENDOCRINE []  Thyroid problems []  Heat intolerance []  Cold Intolerance      Physical Exam   Vitals: BP 122/68   Pulse 66   Ht 158 cm (5' 2.2)   Wt 80.8 kg (178 lb 3.2 oz)   SpO2 95%   BMI 32.38 kg/m   General Appearance:  alert, cooperative, in NAD  HEENT: oropharynx clear, moist mucous membranes  Neurologic: motor and sensory grossly normal bilaterally   Psych: oriented to time, place and person, mood and affect are appropriate  Neck: supple, no significant adenopathy, no jugular venous distension, no bruits  Lungs:   clear to auscultation with good air movement, no rales bilaterally, unlabored breathing   Heart:  normal  rate, regular rhythm, normal S1, S2, no audible murmurs or gallops  Abdomen:   soft, nontender, nondistended, normoactive bowel sounds   Extremities: no pedal edema noted, warm   Musculoskeletal: Moves all extremities. Ambulates without Difficulty   Skin: No rashes or ulcers   Cardiac Data   12 lead ECG:  I personally ordered, reviewed, and interpreted the ECG performed on 10/21/2024 showing: rhythm: normal sinus rhythm,  Results for orders placed or performed in visit on 10/21/24  ECG 12-lead   Collection Time: 10/21/24  9:55 AM  Result Value Ref Range   Vent Rate (bpm) 66    PR Interval (msec) 146    QRS Interval (msec) 76    QT Interval (msec) 390    QTc (msec) 408      Review and Summary of Prior Cardiac Studies: 08/2024:  CONCLUSION ------------------------------------------------------------------------------- NORMAL LEFT VENTRICULAR SYSTOLIC FUNCTION WITH NO LVH ESTIMATED EF: >55% NORMAL LA PRESSURES WITH DIASTOLIC DYSFUNCTION (GRADE 1) NORMAL RIGHT VENTRICULAR SYSTOLIC FUNCTION VALVULAR REGURGITATION: No AR, MILD MR, No PR, TRIVIAL TR ESTIMATED RVSP: 30 mmHg (Normal) NO VALVULAR STENOSIS ------------------------------------------------------------------------------------------ EF >55%  Assessment and Plan  Robin Robles is a 68 y.o. female with a history of HTN, CVA, NSVT, OSA, and Afib.  Afib was documented on monitor in care everywhere but was short in duration.  Per patient this her symptoms are from Afib and feel like what she experiences when told previously she was in Afib.  Per her [facility] and [facility] have both tried to put her on OAC presumably bc of CHADSVASC score and documented Afib although short.  She has not started it bc of expense.  Today we discussed option for [facility] and cheaper Pradaxa. We also discussed that documented Afib was short and her elevated CHADSVASC.  She would like to start and investigate Afib further. We also  discussed options for dealing with Afib and the risk and benefits including 1. No further action, 2. AADs, and PVI.  All options include OAC.  Given that her Afib documentation was short we discussed further monitoring to make sure we are treating the appropriate arrhythmia causing symptoms as it might be her Afib and it might be NSVT or PVCs, also documented, causing her symptoms.   Current visit: Monitor with  no Afib but Short SVT 15 seconds, NSVT a few beats. SVE and VE both less than 1%.  Echo normal structure and function, some diastolic dysfunction.  She did not feel her symptoms she equates to Afib during the monitor but did feel palpitations.    Afib: still feels symptoms, unfortunately not strongly documented that symptoms are only Afib.  She does not want anything aggressive at this time. She will remain on pradaxa. Increase dilt to see if this helps.  Normal LVEF which makes dilt increase more reasonable. SVT and NSVT also an issue. Dilt to treat these as well since we need something generalized to treat all these arrhythmias. Also has complaints of orthostatic hypotension and consistent SOA with exertion.  Later one possible associated with diastolic dysfunction although mild. Will need to keep an eye on orthostatic symptoms with increased dilt.  Follow up PRN     Future Appointments   This patient does not currently have any appointments scheduled.     Orders Placed This Encounter  Procedures  . ECG 12-lead     I spent a total of 40 minutes in both face-to-face and non-face-to-face activities, excluding procedures performed, for this visit on the date of this encounter.

## 2024-10-28 ENCOUNTER — Encounter (HOSPITAL_BASED_OUTPATIENT_CLINIC_OR_DEPARTMENT_OTHER): Payer: Self-pay

## 2024-10-28 ENCOUNTER — Other Ambulatory Visit: Payer: Self-pay

## 2024-10-28 ENCOUNTER — Emergency Department (HOSPITAL_BASED_OUTPATIENT_CLINIC_OR_DEPARTMENT_OTHER)
Admission: EM | Admit: 2024-10-28 | Discharge: 2024-10-28 | Disposition: A | Discharge status: Home / Self Care | Source: Ambulatory Visit | Attending: Emergency Medicine | Admitting: Emergency Medicine

## 2024-10-28 ENCOUNTER — Emergency Department (HOSPITAL_BASED_OUTPATIENT_CLINIC_OR_DEPARTMENT_OTHER)

## 2024-10-28 DIAGNOSIS — S299XXA Unspecified injury of thorax, initial encounter: Secondary | ICD-10-CM | POA: Diagnosis present

## 2024-10-28 DIAGNOSIS — R0609 Other forms of dyspnea: Secondary | ICD-10-CM | POA: Insufficient documentation

## 2024-10-28 DIAGNOSIS — S20212A Contusion of left front wall of thorax, initial encounter: Secondary | ICD-10-CM | POA: Diagnosis not present

## 2024-10-28 DIAGNOSIS — W010XXA Fall on same level from slipping, tripping and stumbling without subsequent striking against object, initial encounter: Secondary | ICD-10-CM | POA: Insufficient documentation

## 2024-10-28 DIAGNOSIS — D3502 Benign neoplasm of left adrenal gland: Secondary | ICD-10-CM | POA: Insufficient documentation

## 2024-10-28 DIAGNOSIS — W19XXXA Unspecified fall, initial encounter: Secondary | ICD-10-CM

## 2024-10-28 LAB — CBC WITH DIFFERENTIAL/PLATELET
Abs Immature Granulocytes: 0.02 K/uL (ref 0.00–0.07)
Basophils Absolute: 0 K/uL (ref 0.0–0.1)
Basophils Relative: 1 %
Eosinophils Absolute: 0.1 K/uL (ref 0.0–0.5)
Eosinophils Relative: 2 %
HCT: 36.9 % (ref 36.0–46.0)
Hemoglobin: 11.5 g/dL — ABNORMAL LOW (ref 12.0–15.0)
Immature Granulocytes: 0 %
Lymphocytes Relative: 28 %
Lymphs Abs: 1.4 K/uL (ref 0.7–4.0)
MCH: 24.9 pg — ABNORMAL LOW (ref 26.0–34.0)
MCHC: 31.2 g/dL (ref 30.0–36.0)
MCV: 80 fL (ref 80.0–100.0)
Monocytes Absolute: 0.6 K/uL (ref 0.1–1.0)
Monocytes Relative: 12 %
Neutro Abs: 2.9 K/uL (ref 1.7–7.7)
Neutrophils Relative %: 57 %
Platelets: 209 K/uL (ref 150–400)
RBC: 4.61 MIL/uL (ref 3.87–5.11)
RDW: 16.6 % — ABNORMAL HIGH (ref 11.5–15.5)
WBC: 5 K/uL (ref 4.0–10.5)
nRBC: 0 % (ref 0.0–0.2)

## 2024-10-28 LAB — COMPREHENSIVE METABOLIC PANEL WITH GFR
ALT: 17 U/L (ref 0–44)
AST: 23 U/L (ref 15–41)
Albumin: 4.1 g/dL (ref 3.5–5.0)
Alkaline Phosphatase: 89 U/L (ref 38–126)
Anion gap: 8 (ref 5–15)
BUN: 14 mg/dL (ref 8–23)
CO2: 27 mmol/L (ref 22–32)
Calcium: 9.3 mg/dL (ref 8.9–10.3)
Chloride: 107 mmol/L (ref 98–111)
Creatinine, Ser: 0.67 mg/dL (ref 0.44–1.00)
GFR, Estimated: >60 mL/min (ref >60)
Glucose, Bld: 122 mg/dL — ABNORMAL HIGH (ref 70–99)
Potassium: 4 mmol/L (ref 3.5–5.1)
Sodium: 142 mmol/L (ref 135–145)
Total Bilirubin: 0.3 mg/dL (ref 0.0–1.2)
Total Protein: 6.4 g/dL — ABNORMAL LOW (ref 6.5–8.1)

## 2024-10-28 LAB — PRO BRAIN NATRIURETIC PEPTIDE: Pro Brain Natriuretic Peptide: 221 pg/mL (ref <300.0)

## 2024-10-28 MED ORDER — ACETAMINOPHEN 500 MG PO TABS
1000.0000 mg | ORAL_TABLET | Freq: Once | ORAL | Status: AC
  Administered 2024-10-28: 1000 mg via ORAL
  Filled 2024-10-28: qty 2

## 2024-10-28 MED ORDER — LIDOCAINE 5 % EX PTCH
1.0000 | MEDICATED_PATCH | CUTANEOUS | Status: DC
  Administered 2024-10-28: 1 via TRANSDERMAL
  Filled 2024-10-28: qty 1

## 2024-10-28 MED ORDER — IOHEXOL 350 MG/ML SOLN
100.0000 mL | Freq: Once | INTRAVENOUS | Status: AC | PRN
  Administered 2024-10-28: 75 mL via INTRAVENOUS

## 2024-10-28 NOTE — Progress Notes (Signed)
 [facility] - Clinic Note  11/04/2024     CHIEF COMPLAINT Patient presents for Retina Follow Up    HISTORY OF PRESENT ILLNESS: Robin Robles is a 68 y.o. female who presents to the clinic today for:   HPI     Retina Follow Up   Patient presents with  Dry AMD.  In both eyes.  This started 3 years ago.  Duration of 9 months.  I, the attending physician,  performed the HPI with the patient and updated documentation appropriately.        Comments   Pt states vision has been about the same but is needing more light while reading and adjusting distance more often of bokks. Pt uses Systane PRN, maybe once per week. Pt denies FOL/pain. Pt has been noticing more floaters but she is not sure which eye. Pt states when she is reading or in a dim room she sees several pink squiggly lines that obstruct her vision. Pt diagnosed with supraventricular tachycardia few weeks ago.       Last edited by Valdemar Rogue, MD on 11/09/2024  8:35 PM.     Pt states she's seeing more floaters. Sees 'pink squigglies' when going from light to dark--after reading. Monitors on amsler grid OU. Pt was seen at the ER for her afib and tachycardia, pt fell.   Referring physician: Octavia Bruckner, MD [address] [place],  [place] [address]  HISTORICAL INFORMATION:   Selected notes from the MEDICAL RECORD NUMBER Referred by Dr. Medford Octavia for eval of ARMD OU   CURRENT MEDICATIONS: No current outpatient medications on file. (Ophthalmic Drugs)   No current facility-administered medications for this visit. (Ophthalmic Drugs)   Current Outpatient Medications (Other)  Medication Sig   dabigatran (PRADAXA) 150 MG CAPS capsule Take 150 mg by mouth 2 (two) times daily.   buPROPion  (WELLBUTRIN  XL) 150 MG 24 hr tablet Take 150 mg by mouth daily.   dicyclomine  (BENTYL ) 10 MG capsule Take 10 mg by mouth 4 (four) times daily -  before meals and at bedtime.   diltiazem  (CARDIZEM  CD)  180 MG 24 hr capsule Take 180 mg by mouth daily.   diltiazem  (CARDIZEM ) 30 MG tablet Take 1 tablet (30 mg total) by mouth 3 (three) times daily as needed. (Patient not taking: Reported on 11/04/2024)   EPINEPHrine 0.3 mg/0.3 mL IJ SOAJ injection Inject into the muscle.   Eptinezumab-jjmr (VYEPTI IV) Inject 100 mg into the vein every 3 (three) months. One dose, 3-4 wks ago   Multiple Vitamins-Minerals (PRESERVISION AREDS PO) Take 1 tablet by mouth daily.   omeprazole  (PRILOSEC) 20 MG capsule Take 20 mg by mouth daily as needed (for acid reflux).   pregabalin  (LYRICA ) 75 MG capsule Take 150 mg by mouth at bedtime.   propranolol (INDERAL) 40 MG tablet Take 40 mg by mouth every evening.   rosuvastatin  (CRESTOR ) 40 MG tablet Take 40 mg by mouth daily.   sertraline  (ZOLOFT ) 100 MG tablet Take 200 mg by mouth every morning.    venlafaxine  XR (EFFEXOR -XR) 37.5 MG 24 hr capsule Take 37.5 mg by mouth at bedtime. for migraines   No current facility-administered medications for this visit. (Other)   REVIEW OF SYSTEMS: ROS   Positive for: Neurological, Musculoskeletal, Eyes Negative for: Constitutional, Gastrointestinal, Skin, Genitourinary, HENT, Endocrine, Cardiovascular, Respiratory, Psychiatric, Allergic/Imm, Heme/Lymph Last edited by Elnor Avelina RAMAN, COT on 11/04/2024 12:48 PM.      ALLERGIES Allergies  Allergen  Reactions   Bee Venom Anaphylaxis    Throat swelling  Throat swelling    Codeine Other (See Comments)    hallucinations Other reaction(s): Delusions (intolerance), Hallucinations   Oxycodone-Acetaminophen  Anxiety    Other reaction(s): Other, Other (See Comments) hyper hyper    PAST MEDICAL HISTORY Past Medical History:  Diagnosis Date   Fibromyalgia    Hypertension    Migraines    Stroke [facility])    Past Surgical History:  Procedure Laterality Date   ABDOMINAL HYSTERECTOMY     CHOLECYSTECTOMY     sinus     THYROID SURGERY     FAMILY HISTORY Family History  Problem  Relation Age of Onset   Heart failure Mother    Migraines Father    Heart failure Father    Migraines Sister    Migraines Brother    SOCIAL HISTORY Social History   Tobacco Use   Smoking status: Never   Smokeless tobacco: Never  Vaping Use   Vaping status: Never Used  Substance Use Topics   Alcohol use: No   Drug use: No       OPHTHALMIC EXAM: Base Eye Exam     Visual Acuity (Snellen - Linear)       Right Left   Dist cc 20/20 20/25   Dist ph cc  NI    Correction: Glasses         Tonometry (Tonopen, 12:58 PM)       Right Left   Pressure 13 14         Pupils       Pupils Dark Light Shape React APD   Right PERRL 4 2 Round Brisk None   Left PERRL 4 2 Round Brisk None         Visual Fields       Left Right    Full Full         Extraocular Movement       Right Left    Full, Ortho Full, Ortho         Neuro/Psych     Oriented x3: Yes   Mood/Affect: Normal         Dilation     Both eyes: 1.0% Mydriacyl, 2.5% Phenylephrine @ 12:59 PM           Slit Lamp and Fundus Exam     External Exam       Right Left   External Normal Normal         Slit Lamp Exam       Right Left   Lids/Lashes Dermatochalasis - upper lid Dermatochalasis - upper lid, mild MGD   Conjunctiva/Sclera White and quiet White and quiet   Cornea arcus, well healed cataract wound arcus, well healed cataract wound   Anterior Chamber deep and clear deep and clear   Iris Round and dilated Round and dilated   Lens PC IOL in good position, 1-2+Posterior capsular opacification PC IOL in good position, trace Posterior capsular opacification   Anterior Vitreous Vitreous syneresis, Posterior vitreous detachment mild syneresis, Posterior vitreous detachment         Fundus Exam       Right Left   Disc mild pallor, sharp rim, mild PPA, +cupping Pink and Sharp, mild PPA/PPP   C/D Ratio 0.75 0.7   Macula Flat, Good foveal reflex, fine Drusen, mild RPE mottling, No  heme or edema Flat, Good foveal reflex, Drusen, RPE mottling and clumping, No heme or edema  Vessels attenuated, mild tortuosity attenuated, Tortuous   Periphery Attached, focal peripheral drusen at 0300, no heme Attached, No heme, no RT/RD           Refraction     Wearing Rx       Sphere Cylinder Axis Add   Right -0.50 +1.25 083 +2.50   Left [place] +0.25 075 +2.50    Age: 51 months   Type: Progressive           IMAGING AND PROCEDURES  Imaging and Procedures for 11/04/2024  OCT, Retina - OU - Both Eyes       Right Eye Quality was good. Central Foveal Thickness: 279. Progression has been stable. Findings include normal foveal contour, no IRF, no SRF, retinal drusen .   Left Eye Quality was good. Central Foveal Thickness: 272. Progression has been stable. Findings include normal foveal contour, no IRF, no SRF, retinal drusen .   Notes *Images captured and stored on drive  Diagnosis / Impression:  Non-exu ARMD OU -- Stable drusen, no fluid OU NFP, no IRF/SRF OU  Clinical management:  See below  Abbreviations: NFP - Normal foveal profile. CME - cystoid macular edema. PED - pigment epithelial detachment. IRF - intraretinal fluid. SRF - subretinal fluid. EZ - ellipsoid zone. ERM - epiretinal membrane. ORA - outer retinal atrophy. ORT - outer retinal tubulation. SRHM - subretinal hyper-reflective material. IRHM - intraretinal hyper-reflective material              ASSESSMENT/PLAN:    ICD-10-CM   1. Intermediate stage nonexudative age-related macular degeneration of both eyes  H35.3132 OCT, Retina - OU - Both Eyes    2. Posterior vitreous detachment of both eyes  H43.813     3. 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 Essential hypertension  I10     5. Hypertensive retinopathy of both eyes  H35.033     6. Pseudophakia, both eyes  Z96.1       1. Age related macular degeneration, non-exudative, OU  - intermediate stage -- stable  - no exudative disease on exam  or OCT  - The incidence, anatomy, and pathology of dry AMD, risk of progression, and the AREDS and AREDS 2 studies including smoking risks discussed with patient.  - Continue amsler grid monitoring  - f/u 9 months, sooner prn -- DFE, OCT  2. PVD / vitreous syneresis OU  - acute PVD OS w/ symptomatic floaters -- onset early May 2024  - Discussed findings and prognosis  - No RT or RD on 360 peripheral exam  - Reviewed s/s of RT/RD  - Strict return precautions for any such RT/RD signs/symptoms  - monitor  3. Ocular migraine v migraine w/ aura  - pt with known history of migraines w/ aura, but auras have changed from wavy lines and colors to a symmetrical object with black lines emanating from it  - eye exam without ophthalmic findings to explain symptoms -- making migraine mechanism more likely  - no retinal or ophthalmic interventions indicated or recommended  - recommend discussing with neurologist for further recommendations and input  4,5. Hypertensive retinopathy OU - discussed importance of tight BP control - monitor  6. Pseudophakia OU  - s/p CE/IOL OU (Dr. KYM Gaudy)  - IOLs in good position, doing well  - monitor  Ophthalmic Meds Ordered this visit:  No orders of the defined types were placed in this encounter.    Return in about 9 months (around 08/04/2025)  for non exu ARMD OU, DFE, OCT.  There are no Patient Instructions on file for this visit.   Explained the diagnoses, plan, and follow up with the patient and they expressed understanding.  Patient expressed understanding of the importance of proper follow up care.   This document serves as a record of services personally performed by Redell JUDITHANN Hans, MD, PhD. It was created on their behalf by Wanda GEANNIE Keens, COT an ophthalmic technician. The creation of this record is the provider's dictation and/or activities during the visit.    Electronically signed by:  Wanda GEANNIE Keens, COT  11/09/24 8:36 PM  Redell JUDITHANN Hans, M.D., Ph.D. Diseases & Surgery of the Retina and Vitreous [facility]  I have reviewed the above documentation for accuracy and completeness, and I agree with the above. Redell JUDITHANN Hans, M.D., Ph.D. 11/09/24 8:36 PM   Abbreviations: M myopia (nearsighted); A astigmatism; H hyperopia (farsighted); P presbyopia; Mrx spectacle prescription;  CTL contact lenses; OD right eye; OS left eye; OU both eyes  XT exotropia; ET esotropia; PEK punctate epithelial keratitis; PEE punctate epithelial erosions; DES dry eye syndrome; MGD meibomian gland dysfunction; ATs artificial tears; PFAT's preservative free artificial tears; NSC nuclear sclerotic cataract; PSC posterior subcapsular cataract; ERM epi-retinal membrane; PVD posterior vitreous detachment; RD retinal detachment; DM diabetes mellitus; DR diabetic retinopathy; NPDR non-proliferative diabetic retinopathy; PDR proliferative diabetic retinopathy; CSME clinically significant macular edema; DME diabetic macular edema; dbh dot blot hemorrhages; CWS cotton wool spot; POAG primary open angle glaucoma; C/D cup-to-disc ratio; HVF humphrey visual field; GVF goldmann visual field; OCT optical coherence tomography; IOP intraocular pressure; BRVO Branch retinal vein occlusion; CRVO central retinal vein occlusion; CRAO central retinal artery occlusion; BRAO branch retinal artery occlusion; RT retinal tear; SB scleral buckle; PPV pars plana vitrectomy; VH Vitreous hemorrhage; PRP panretinal laser photocoagulation; IVK intravitreal kenalog; VMT vitreomacular traction; MH Macular hole;  NVD neovascularization of the disc; NVE neovascularization elsewhere; AREDS age related eye disease study; ARMD age related macular degeneration; POAG primary open angle glaucoma; EBMD epithelial/anterior basement membrane dystrophy; ACIOL anterior chamber intraocular lens; IOL intraocular lens; PCIOL posterior chamber intraocular lens; Phaco/IOL phacoemulsification  with intraocular lens placement; PRK photorefractive keratectomy; LASIK laser assisted in situ keratomileusis; HTN hypertension; DM diabetes mellitus; COPD chronic obstructive pulmonary disease

## 2024-10-28 NOTE — Discharge Instructions (Addendum)
 Please follow up with primary care and cardiology. Seek emergency care if experiencing any new or worsening symptoms.

## 2024-10-28 NOTE — ED Triage Notes (Signed)
 Patient fell Saturday onto a wooden table with a sharp edge on her LEFT side. She did not hit her head, she did not lose consciousness. She states she is on Pradaxa. Called her PCP and they recommended she come get evaluated.

## 2024-10-28 NOTE — ED Notes (Signed)
 Ambulatory pulse ox performed. Patient started at 98% with a HR of 93. Walked around the entirety of the ER from room 16. Her O2 sat dropped to 91% at its lowest with a HR of 117. She then returned to baseline upon sitting. Patient reported she didn't feel shortness of breath.

## 2024-10-28 NOTE — ED Provider Notes (Signed)
  EMERGENCY DEPARTMENT AT [facility] Provider Note   CSN: [id number] Arrival date & time: 10/28/24  9193     Patient presents with: Fall and Rib Injury (Left)   Robin Robles is a 68 y.o. female with PMHx fibromyalgia, migraines, stroke, afib on pradaxa who presents to ED concerned for mechanical fall. Fall happened 3 days ago. Patient tripped and hit the upper left chest on a hard wooden corner of a countertop. Endorses chronic cough which has not worsened recently. Patient also endorsing DOE which was present prior to the mechanical fall - patient following with outpatient cardiologist for this concern.  Denies fever, nausea, vomiting. Denies head trauma, LOC, seizure. Denies headaches, vision changes. Denies new paresthesias (endorses chronic tingling of right fingers).       Fall Associated symptoms include shortness of breath.       Prior to Admission medications   Medication Sig Start Date End Date Taking? Authorizing Provider  diltiazem  (CARDIZEM  CD) 180 MG 24 hr capsule Take 180 mg by mouth daily. 10/21/24 10/21/25 Yes [provider]  buPROPion  (WELLBUTRIN  XL) 150 MG 24 hr tablet Take 150 mg by mouth daily. 05/07/20   [provider]  dicyclomine  (BENTYL ) 10 MG capsule Take 10 mg by mouth 4 (four) times daily -  before meals and at bedtime.    [provider]  diltiazem  (CARDIZEM ) 30 MG tablet Take 1 tablet (30 mg total) by mouth 3 (three) times daily as needed. 11/27/22   Custovic, Sabina, DO  EPINEPHrine 0.3 mg/0.3 mL IJ SOAJ injection Inject into the muscle. 06/06/17   [provider]  Eptinezumab-jjmr (VYEPTI IV) Inject 100 mg into the vein every 3 (three) months. One dose, 3-4 wks ago    Ines Onetha NOVAK, MD  Multiple Vitamins-Minerals (PRESERVISION AREDS PO) Take 1 tablet by mouth daily.    [provider]  omeprazole  (PRILOSEC) 20 MG capsule Take 20 mg by mouth daily as needed (for acid reflux).     [provider]  pregabalin  (LYRICA ) 75 MG capsule Take 150 mg by mouth at bedtime.    [provider]  propranolol (INDERAL) 40 MG tablet Take 40 mg by mouth every evening.    [provider]  rosuvastatin  (CRESTOR ) 40 MG tablet Take 40 mg by mouth daily.    [provider]  sertraline  (ZOLOFT ) 100 MG tablet Take 200 mg by mouth every morning.     [provider]  venlafaxine  XR (EFFEXOR -XR) 37.5 MG 24 hr capsule Take 37.5 mg by mouth at bedtime. for migraines    [provider]    Allergies: Bee venom, Codeine, and Oxycodone-acetaminophen     Review of Systems  Respiratory:  Positive for shortness of breath.     Updated Vital Signs BP 126/74   Pulse 78   Temp 98 F (36.7 C) (Oral)   Resp 17   SpO2 91%   Physical Exam Vitals and nursing note reviewed.  Constitutional:      General: She is not in acute distress.    Appearance: She is not ill-appearing or toxic-appearing.  HENT:     Head: Normocephalic and atraumatic.     Mouth/Throat:     Mouth: Mucous membranes are moist.     Pharynx: No oropharyngeal exudate or posterior oropharyngeal erythema.  Eyes:     General: No scleral icterus.       Right eye: No discharge.        Left eye: No discharge.  Extraocular Movements: Extraocular movements intact.     Conjunctiva/sclera: Conjunctivae normal.     Pupils: Pupils are equal, round, and reactive to light.  Cardiovascular:     Rate and Rhythm: Normal rate and regular rhythm.     Pulses: Normal pulses.     Heart sounds: Normal heart sounds. No murmur heard. Pulmonary:     Effort: Pulmonary effort is normal. No respiratory distress.     Breath sounds: Normal breath sounds. No wheezing, rhonchi or rales.  Chest:    Abdominal:     General: Abdomen is flat. Bowel sounds are normal. There is no distension.     Palpations: Abdomen is soft. There is no mass.     Tenderness: There is no abdominal tenderness.   Musculoskeletal:     Right lower leg: No edema.     Left lower leg: No edema.     Comments: Bruising over left upper chest. Tenderness as well. No obvious crepitus or step-offs.  Skin:    General: Skin is warm and dry.     Findings: No rash.  Neurological:     General: No focal deficit present.     Mental Status: She is alert and oriented to person, place, and time. Mental status is at baseline.     Comments: GCS 15. Speech is goal oriented. No deficits appreciated to CN III-XII; symmetric eyebrow raise, no facial drooping, tongue midline. Patient has equal grip strength bilaterally with 5/5 strength against resistance in all major muscle groups bilaterally. Sensation to light touch intact. Patient moves extremities without ataxia. Patient ambulatory with steady gait.   Psychiatric:        Mood and Affect: Mood normal.        Behavior: Behavior normal.     (all labs ordered are listed, but only abnormal results are displayed) Labs Reviewed  CBC WITH DIFFERENTIAL/PLATELET - Abnormal; Notable for the following components:      Result Value   Hemoglobin 11.5 (*)    MCH 24.9 (*)    RDW 16.6 (*)    All other components within normal limits  COMPREHENSIVE METABOLIC PANEL WITH GFR - Abnormal; Notable for the following components:   Glucose, Bld 122 (*)    Total Protein 6.4 (*)    All other components within normal limits  PRO BRAIN NATRIURETIC PEPTIDE    EKG: EKG Interpretation Date/Time:  Tuesday October 28 2024 10:02:47 EST Ventricular Rate:  76 PR Interval:  162 QRS Duration:  91 QT Interval:  370 QTC Calculation: 416 R Axis:   60  Text Interpretation: Sinus rhythm Low voltage, precordial leads Borderline T abnormalities, anterior leads since last tracing no significant change Confirmed by Lenor Hollering [phone]) on 10/28/2024 10:43:44 AM  Radiology: CT Angio Chest PE W/Cm &/Or Wo Cm Result Date: 10/28/2024 CLINICAL DATA:  Pulmonary embolism (PE) suspected, high prob  Patient fell Saturday onto a wooden table with a sharp edge on her LEFT side. She did not hit her head, she did not lose consciousness. She states she is on Pradaxa. EXAM: CT ANGIOGRAPHY CHEST WITH CONTRAST TECHNIQUE: Multidetector CT imaging of the chest was performed using the standard protocol during bolus administration of intravenous contrast. Multiplanar CT image reconstructions and MIPs were obtained to evaluate the vascular anatomy. RADIATION DOSE REDUCTION: This exam was performed according to the departmental dose-optimization program which includes automated exposure control, adjustment of the mA and/or kV according to patient size and/or use of iterative reconstruction technique. CONTRAST:  75mL OMNIPAQUE  IOHEXOL   350 MG/ML SOLN COMPARISON:  Chest XR, concurrent. CTA head and neck, 09/23/2022. CT AP, 10/23/2018. FINDINGS: Cardiovascular: Satisfactory opacification of the pulmonary arteries to the segmental level. No evidence of pulmonary embolism. Normal heart size. No pericardial effusion. Aortic arch atherosclerosis. Mediastinum/Nodes: No enlarged mediastinal, hilar, or axillary lymph nodes. Thyroid gland, trachea, and esophagus demonstrate no significant findings. Lungs/Pleura: Lungs are clear without focal consolidation, mass or suspicious pulmonary nodule. No pleural effusion or pneumothorax. Upper Abdomen: No acute abnormality. Cholecystectomy. 2.6 cm LEFT adrenal thickening, with focus of macroscopic fat. Musculoskeletal: No acute chest wall abnormality. No acute osseous findings. Review of the MIP images confirms the above findings. IMPRESSION: 1. No acute vascular or nonvascular intrathoracic abnormality. Specifically, no segmental or larger pulmonary embolus. 2. 2.6 cm LEFT adrenal mass, previously 2.2 cm in 10/23/2018. Focused of macroscopic fat, favored consistent with adrenal adenoma. 3. Aortic Atherosclerosis (ICD10-I70.0). Additional incidental, chronic and senescent findings as above.  Electronically Signed   By: Thom Hall M.D.   On: 10/28/2024 12:45   DG Chest Portable 1 View Result Date: 10/28/2024 EXAM: 1 VIEW(S) XRAY OF THE CHEST 10/28/2024 09:15:00 AM COMPARISON: 08/31/2022 CLINICAL HISTORY: rib pain FINDINGS: LUNGS AND PLEURA: No focal pulmonary opacity. No pulmonary edema. No pleural effusion. No pneumothorax. HEART AND MEDIASTINUM: Aortic arch calcifications. No acute abnormality of the cardiac and mediastinal silhouettes. BONES AND SOFT TISSUES: No acute osseous abnormality. IMPRESSION: 1. No acute cardiopulmonary process. Electronically signed by: Dayne Hassell MD 10/28/2024 09:36 AM EST RP Workstation: [id number]     Procedures   Medications Ordered in the ED  lidocaine  (LIDODERM ) 5 % 1 patch (1 patch Transdermal Patch Applied 10/28/24 0943)  acetaminophen  (TYLENOL ) tablet 1,000 mg (1,000 mg Oral Given 10/28/24 0943)  iohexol  (OMNIPAQUE ) 350 MG/ML injection 100 mL (75 mLs Intravenous Contrast Given 10/28/24 1154)                                    Medical Decision Making Amount and/or Complexity of Data Reviewed Labs: ordered. Radiology: ordered.  Risk OTC drugs. Prescription drug management.   This patient presents to the ED after a fall, this involves an extensive number of treatment options, and is a complaint that carries with it a high risk of complications and morbidity.  The differential diagnosis includes  intracranial hemorrhage, subdural/epidural hematoma, vertebral fracture, spinal cord injury, muscle strain, skull fracture, fracture.   Co morbidities that complicate the patient evaluation  fibromyalgia, migraines, stroke, afib on pradaxa    Additional history obtained:  Dr. Jacques PCP 10/28 Cardiology note: Current visit: Monitor with no Afib but Short SVT 15 seconds, NSVT a few beats. SVE and VE both less than 1%. Echo normal structure and function, some diastolic dysfunction. She did not feel her symptoms she equates to Afib during  the monitor but did feel palpitations.    Problem List / ED Course / Critical interventions / Medication management  Patient presented for mechanical fall. Endorses intermittent DOE which was present prior to fall. Patient with stable vitals and does not appear to be in distress.  Physical exam with bruising and tenderness of left upper chest.  Patient ambulated with O2 starting at 98% and dropping as low as 91% on RA.  Patient denied SOB during this ambulation trial.  Rest of physical exam reassuring.  Patient afebrile with stable vitals. I Ordered, and personally interpreted labs.  CBC without leukocytosis.  There is mild  anemia with hemoglobin at 11.5.  CMP with mild hyperglycemia at 122.  BNP within normal limits at 221. The patient was maintained on a cardiac monitor.  I personally viewed and interpreted the cardiac monitored which showed an underlying rhythm of: Sinus rhythm - no acute changes from prior. I ordered imaging studies including chest x-ray, CTA chest. I independently visualized and interpreted imaging which showed no acute process. I agree with the radiologist interpretation.  CT incidentally finding adrenal adenoma.  Patient stating that she already knew but this diagnosis and follows with PCP for it. Patient's pain is feeling better after Tylenol  and lidocaine  patch. Shared all results with patient.  Answered all questions.  Patient requesting to go home which I feel is appropriate.  As for patient's intermittent DOE, I agree with her outpatient cardiologist that this is probably due to her short runs of SVT and proximal A-fib.  Patient did not have any complaints of SOB while ambulating in ED today.  Patient agrees to have close follow-up with her PCP and cardiologist. Staffed with Dr. Lenor who agreed with plan. I have reviewed the patients home medicines and have made adjustments as needed The patient has been appropriately medically screened and/or stabilized in the ED. I have  low suspicion for any other emergent medical condition which would require further screening, evaluation or treatment in the ED or require inpatient management. At time of discharge the patient is hemodynamically stable and in no acute distress. I have discussed work-up results and diagnosis with patient and answered all questions. Patient is agreeable with discharge plan. We discussed strict return precautions for returning to the emergency department and they verbalized understanding.      Social Determinants of Health:  geriatric        Final diagnoses:  Fall, initial encounter  Adenoma of left adrenal gland  DOE (dyspnea on exertion)    ED Discharge Orders     None          Hoy Nidia FALCON, [place] 10/28/24 1349    Lenor Hollering, MD 10/28/24 [phone]

## 2024-11-04 ENCOUNTER — Encounter (INDEPENDENT_AMBULATORY_CARE_PROVIDER_SITE_OTHER): Payer: Self-pay | Admitting: Ophthalmology

## 2024-11-04 ENCOUNTER — Ambulatory Visit (INDEPENDENT_AMBULATORY_CARE_PROVIDER_SITE_OTHER): Payer: Medicare Other | Admitting: Ophthalmology

## 2024-11-04 DIAGNOSIS — H35033 Hypertensive retinopathy, bilateral: Secondary | ICD-10-CM | POA: Diagnosis not present

## 2024-11-04 DIAGNOSIS — H353132 Nonexudative age-related macular degeneration, bilateral, intermediate dry stage: Secondary | ICD-10-CM | POA: Diagnosis not present

## 2024-11-04 DIAGNOSIS — H43813 Vitreous degeneration, bilateral: Secondary | ICD-10-CM

## 2024-11-04 DIAGNOSIS — I10 Essential (primary) hypertension: Secondary | ICD-10-CM

## 2024-11-04 DIAGNOSIS — G43109 Migraine with aura, not intractable, without status migrainosus: Secondary | ICD-10-CM

## 2024-11-04 DIAGNOSIS — Z961 Presence of intraocular lens: Secondary | ICD-10-CM

## 2024-11-09 ENCOUNTER — Encounter (INDEPENDENT_AMBULATORY_CARE_PROVIDER_SITE_OTHER): Payer: Self-pay | Admitting: Ophthalmology

## 2024-11-11 ENCOUNTER — Ambulatory Visit: Admitting: Neurology

## 2024-11-17 ENCOUNTER — Encounter: Payer: Self-pay | Admitting: Adult Health

## 2024-12-15 NOTE — Progress Notes (Signed)
 CARDIOLOGY CONSULT NOTE       Patient ID: Robin Robles MRN: [id number] DOB/AGE: Dec 22, 1956 68 y.o.   Referring Physician: Jacques Primary Physician: Jacques Camie Pepper, PA-C Primary Cardiologist: new Reason for Consultation: PAF    HPI:  68 y.o. with history of PAF, HTN, CVA, NSVT, OSA. She has been followed by [facility] cardiology Vinie Kerns EP  Most recently 10/21/24 when her cardizem  was increased. She takes Inderal at night which also helps her sleep. She had concerns about cost of DOAC but has been taking Pradaxa Echo done 09/23/22 with EF 65-70% Normal RV trivial MR LA only 31 mm. Monitor 11/15/22 showed average HR 76 bpm. With one episode of PAF lasting 47 seconds. With isolated PAC/PVC < 1% total beats.   CHAD VASC 5  Although Dr Kerns is EP doctor he has not pursued ablation or considering ILR to document frequency of PAF.  She has not been on AAT either Initial diagnosis of PAF in 2023 and MRI at that time showed silent strokes.   Long discussion with her about less efficacy of AAT vs ablation and safety of procedure in 2026. She continues to have episodes of PAF and SVT. No syncope chest pain or dyspnea  ROS All other systems reviewed and negative except as noted above  Past Medical History:  Diagnosis Date   Fibromyalgia    Hypertension    Migraines    Stroke (HCC)     Family History  Problem Relation Age of Onset   Heart failure Mother    Migraines Father    Heart failure Father    Migraines Sister    Migraines Brother     Social History   Socioeconomic History   Marital status: Married    Spouse name: Not on file   Number of children: 2   Years of education: Not on file   Highest education level: Not on file  Occupational History   Not on file  Tobacco Use   Smoking status: Never   Smokeless tobacco: Never  Vaping Use   Vaping status: Never Used  Substance and Sexual Activity   Alcohol use: No   Drug use: No   Sexual activity: Not Currently   Other Topics Concern   Not on file  Social History Narrative   Not on file   Social Drivers of Health   Tobacco Use: Low Risk (12/22/2024)   Patient History    Smoking Tobacco Use: Never    Smokeless Tobacco Use: Never    Passive Exposure: Not on file  Financial Resource Strain: Low Risk  (09/09/2024)   Received from [facility]   Overall Financial Resource Strain (CARDIA)    Difficulty of Paying Living Expenses: Not very hard  Food Insecurity: No Food Insecurity (09/09/2024)   Received from [facility]   Epic    Within the past 12 months, you worried that your food would run out before you got the money to buy more.: Never true    Within the past 12 months, the food you bought just didn't last and you didn't have money to get more.: Never true  Transportation Needs: No Transportation Needs (09/09/2024)   Received from [facility] - Transportation    In the past 12 months, has lack of transportation kept you from medical appointments or from getting medications?: No    Lack of Transportation (Non-Medical): No  Physical Activity: Insufficiently Active (04/27/2024)   Received  from [facility]   Exercise Vital Sign    On average, how many days per week do you engage in moderate to strenuous exercise (like a brisk walk)?: 3 days    On average, how many minutes do you engage in exercise at this level?: 10 min  Stress: No Stress Concern Present (04/27/2024)   Received from [facility] - Occupational Stress Questionnaire    Feeling of Stress : Not at all  Social Connections: Socially Integrated (04/27/2024)   Received from [facility]   Social Network    How would you rate your social network (family, work, friends)?: Good participation with social networks  Intimate Partner Violence: Not At Risk (04/27/2024)   Received from [facility]   HITS    Over the last 12 months how  often did your partner physically hurt you?: Never    Over the last 12 months how often did your partner insult you or talk down to you?: Never    Over the last 12 months how often did your partner threaten you with physical harm?: Never    Over the last 12 months how often did your partner scream or curse at you?: Never  Depression (PHQ2-9): Not on file  Alcohol Screen: Not on file  Housing: Low Risk  (09/09/2024)   Received from [facility]   Epic    In the last 12 months, was there a time when you were not able to pay the mortgage or rent on time?: No    In the past 12 months, how many times have you moved where you were living?: 0    At any time in the past 12 months, were you homeless or living in a shelter (including now)?: No  Utilities: Not At Risk (09/09/2024)   Received from [facility]   Epic    In the past 12 months has the electric, gas, oil, or water company threatened to shut off services in your home?: No  Health Literacy: Not on file    Past Surgical History:  Procedure Laterality Date   ABDOMINAL HYSTERECTOMY     CHOLECYSTECTOMY     sinus     THYROID SURGERY       Current Medications[1]    Physical Exam: Blood pressure (!) 140/74, pulse 69, height 5' 2 (1.575 m), weight 177 lb (80.3 kg), SpO2 95%.    Affect appropriate Healthy:  appears stated age HEENT: normal Neck supple with no adenopathy JVP normal no bruits no thyromegaly Lungs clear with no wheezing and good diaphragmatic motion Heart:  S1/S2 no murmur, no rub, gallop or click PMI normal Abdomen: benighn, BS positve, no tenderness, no AAA no bruit.  No HSM or HJR Distal pulses intact with no bruits No edema Neuro non-focal Skin warm and dry No muscular weakness   Labs:   Lab Results  Component Value Date   WBC 5.0 10/28/2024   HGB 11.5 (L) 10/28/2024   HCT 36.9 10/28/2024   MCV 80.0 10/28/2024   PLT 209 10/28/2024   No results for input(s): NA,  K, CL, CO2, BUN, CREATININE, CALCIUM , PROT, BILITOT, ALKPHOS, ALT, AST, GLUCOSE in the last 168 hours.  Invalid input(s): LABALBU No results found for: CKTOTAL, CKMB, CKMBINDEX, TROPONINI  Lab Results  Component Value Date   CHOL 247 (H) 09/23/2022   Lab Results  Component Value Date   HDL 42 09/23/2022   Lab Results  Component Value  Date   LDLCALC 176 (H) 09/23/2022   Lab Results  Component Value Date   TRIG 145 09/23/2022   Lab Results  Component Value Date   CHOLHDL 5.9 09/23/2022   No results found for: LDLDIRECT    Radiology: No results found.  EKG: 10/28/24 SR rate 72 nonspecific ST changes normal QT    ASSESSMENT AND PLAN:   PAF:  since 2023 currently on pradaxa, cardizem  and Inderal. TTE with normal EF LA 31 mm. CHADVASC 5.  Seems like she still has episodes of PAF. She has been hesitant to try AAT or ablation. She has been seen by [facility] EP Dr Ezzard. Discussed options including ablation, ILR for monitoring and AAT. Will refer to our EP for ablation AFter discussion and answering questions she seems much more amenable to ablation than anything else IF AAT decided on or needed post ablation would have patient get calcium  score and ETT  Refer to EP  F/U general cardiology PRN   Signed: Maude Emmer 12/23/2024, 9:12 AM        [1]  Current Outpatient Medications:    buPROPion  (WELLBUTRIN  XL) 150 MG 24 hr tablet, Take 150 mg by mouth daily., Disp: , Rfl:    dabigatran (PRADAXA) 150 MG CAPS capsule, Take 150 mg by mouth 2 (two) times daily., Disp: , Rfl:    dicyclomine  (BENTYL ) 10 MG capsule, Take 10 mg by mouth 4 (four) times daily -  before meals and at bedtime., Disp: , Rfl:    diltiazem  (CARDIZEM  CD) 180 MG 24 hr capsule, Take 180 mg by mouth daily. (Patient taking differently: Take 120 mg by mouth daily.), Disp: , Rfl:    diltiazem  (CARDIZEM ) 30 MG tablet, Take 1 tablet (30 mg total) by mouth 3 (three) times daily as  needed., Disp: 90 tablet, Rfl: 6   EPINEPHrine 0.3 mg/0.3 mL IJ SOAJ injection, Inject into the muscle., Disp: , Rfl:    Eptinezumab-jjmr (VYEPTI IV), Inject 100 mg into the vein every 3 (three) months. One dose, 3-4 wks ago, Disp: , Rfl:    Multiple Vitamins-Minerals (PRESERVISION AREDS PO), Take 1 tablet by mouth daily., Disp: , Rfl:    nebivolol (BYSTOLIC) 2.5 MG tablet, Take 2.5 mg by mouth daily. TAKE ONE TABLET (2.5 MG DOSE) BY MOUTH DAILY. PLEASE, HOLD NEBIVOLOL IF SYSTOLIC BP IS LESS THAN 110 OR HR IS LESS THAN 60., Disp: , Rfl:    omeprazole  (PRILOSEC) 20 MG capsule, Take 20 mg by mouth daily as needed (for acid reflux)., Disp: , Rfl:    pregabalin  (LYRICA ) 75 MG capsule, Take 150 mg by mouth at bedtime., Disp: , Rfl:    propranolol (INDERAL) 40 MG tablet, Take 40 mg by mouth every evening., Disp: , Rfl:    rosuvastatin  (CRESTOR ) 40 MG tablet, Take 40 mg by mouth daily., Disp: , Rfl:    sertraline  (ZOLOFT ) 100 MG tablet, Take 200 mg by mouth every morning. , Disp: , Rfl:    venlafaxine  XR (EFFEXOR -XR) 37.5 MG 24 hr capsule, Take 37.5 mg by mouth at bedtime. for migraines, Disp: , Rfl:

## 2024-12-23 ENCOUNTER — Ambulatory Visit: Admitting: Cardiovascular Disease

## 2024-12-23 VITALS — BP 140/74 | HR 69 | Ht 62.0 in | Wt 177.0 lb

## 2024-12-23 DIAGNOSIS — I10 Essential (primary) hypertension: Secondary | ICD-10-CM | POA: Diagnosis not present

## 2024-12-23 DIAGNOSIS — I4729 Other ventricular tachycardia: Secondary | ICD-10-CM

## 2024-12-23 NOTE — Patient Instructions (Signed)
 Medication Instructions:  No medication changes were made at this visit. Continue current regimen.   *If you need a refill on your cardiac medications before your next appointment, please call your pharmacy*  Lab Work: None ordered today. If you have labs (blood work) drawn today and your tests are completely normal, you will receive your results only by: MyChart Message (if you have MyChart) OR A paper copy in the mail If you have any lab test that is abnormal or we need to change your treatment, we will call you to review the results.  Testing/Procedures: None ordered today.  Follow-Up: At [facility], you and your health needs are our priority.  As part of our continuing mission to provide you with exceptional heart care, our providers are all part of one team.  This team includes your primary Cardiologist (physician) and Advanced Practice Providers or APPs (Physician Assistants and Nurse Practitioners) who all work together to provide you with the care you need, when you need it.  We recommend signing up for the patient portal called MyChart.  Sign up information is provided on this After Visit Summary.  MyChart is used to connect with patients for Virtual Visits (Telemedicine).  Patients are able to view lab/test results, encounter notes, upcoming appointments, etc.  Non-urgent messages can be sent to your provider as well.   To learn more about what you can do with MyChart, go to [email or web address].   Other Instructions You have been referred to our electrophysiology team to discuss an afib ablation. Our office will be in contact with you to schedule this initial appointment.

## 2025-01-21 ENCOUNTER — Telehealth: Payer: Self-pay | Admitting: Gastroenterology

## 2025-01-21 NOTE — Telephone Encounter (Signed)
 Good afternoon Dr. Stacia  The following patient is requesting to transfer care to us  for an internal hemorrhoid with rectal bleeding. She has been a patient of [facility] and stated she can't get an early appointment to see anyone and that it is too far of a drive. Records are in care everywhere. Please review and advise of scheduling.   Thank you.

## 2025-01-30 ENCOUNTER — Encounter: Payer: Self-pay | Admitting: Student in an Organized Health Care Education/Training Program

## 2025-01-30 ENCOUNTER — Ambulatory Visit: Admitting: Student in an Organized Health Care Education/Training Program

## 2025-01-30 NOTE — Progress Notes (Unsigned)
" °  Cardiology Office Note   Date:  01/30/2025  ID:  Robin Robles, DOB Apr 27, 1956, MRN [id number] PCP: Jacques Camie Pepper, PA-C  [facility] Providers Cardiologist:  None { Click to update primary MD,subspecialty MD or APP then REFRESH:1}    History of Present Illness Robin Robles is a 69 y.o. female ***  ROS: ***  Studies Reviewed      *** Risk Assessment/Calculations {Does this patient have ATRIAL FIBRILLATION?:[id number]}         Physical Exam VS:  BP 116/66 (BP Location: Left Arm, Patient Position: Sitting, Cuff Size: Normal)   Pulse 68   Ht 5' 2 (1.575 m)   Wt 176 lb (79.8 kg)   SpO2 95%   BMI 32.19 kg/m        Wt Readings from Last 3 Encounters:  01/30/25 176 lb (79.8 kg)  12/23/24 177 lb (80.3 kg)  03/19/23 173 lb 3.2 oz (78.6 kg)    GEN: Well nourished, well developed in no acute distress NECK: No JVD; No carotid bruits CARDIAC: ***RRR, no murmurs, rubs, gallops RESPIRATORY:  Clear to auscultation without rales, wheezing or rhonchi  ABDOMEN: Soft, non-tender, non-distended EXTREMITIES:  No edema; No deformity   ASSESSMENT AND PLAN ***    {Are you ordering a CV Procedure (e.g. stress test, cath, DCCV, TEE, etc)?   Press F2        :[id number]}  Dispo: ***  Signed, Donnice DELENA Primus, MD  "

## 2025-01-30 NOTE — Progress Notes (Unsigned)
" °  Cardiology Office Note   Date:  01/30/2025  ID:  AKIYA MORR, DOB Apr 27, 1956, MRN [id number] PCP: Jacques Camie Pepper, PA-C  [facility] Providers Cardiologist:  None { Click to update primary MD,subspecialty MD or APP then REFRESH:1}    History of Present Illness TALEYAH HILLMAN is a 69 y.o. female ***  ROS: ***  Studies Reviewed      *** Risk Assessment/Calculations {Does this patient have ATRIAL FIBRILLATION?:[id number]}         Physical Exam VS:  BP 116/66 (BP Location: Left Arm, Patient Position: Sitting, Cuff Size: Normal)   Pulse 68   Ht 5' 2 (1.575 m)   Wt 176 lb (79.8 kg)   SpO2 95%   BMI 32.19 kg/m        Wt Readings from Last 3 Encounters:  01/30/25 176 lb (79.8 kg)  12/23/24 177 lb (80.3 kg)  03/19/23 173 lb 3.2 oz (78.6 kg)    GEN: Well nourished, well developed in no acute distress NECK: No JVD; No carotid bruits CARDIAC: ***RRR, no murmurs, rubs, gallops RESPIRATORY:  Clear to auscultation without rales, wheezing or rhonchi  ABDOMEN: Soft, non-tender, non-distended EXTREMITIES:  No edema; No deformity   ASSESSMENT AND PLAN ***    {Are you ordering a CV Procedure (e.g. stress test, cath, DCCV, TEE, etc)?   Press F2        :[id number]}  Dispo: ***  Signed, Donnice DELENA Primus, MD  "

## 2025-01-30 NOTE — Telephone Encounter (Signed)
 PT has been scheduled.

## 2025-01-30 NOTE — Patient Instructions (Signed)
 Medication Instructions:  Your physician recommends that you continue on your current medications as directed. Please refer to the Current Medication list given to you today.  *If you need a refill on your cardiac medications before your next appointment, please call your pharmacy*  Lab Work: None ordered.  If you have labs (blood work) drawn today and your tests are completely normal, you will receive your results only by: MyChart Message (if you have MyChart) OR A paper copy in the mail If you have any lab test that is abnormal or we need to change your treatment, we will call you to review the results.  Testing/Procedures: None ordered.   Follow-Up: At [facility], you and your health needs are our priority.  As part of our continuing mission to provide you with exceptional heart care, our providers are all part of one team.  This team includes your primary Cardiologist (physician) and Advanced Practice Providers or APPs (Physician Assistants and Nurse Practitioners) who all work together to provide you with the care you need, when you need it.  Your next appointment:   3 months with Dr Almetta

## 2025-03-09 ENCOUNTER — Ambulatory Visit: Admitting: Gastroenterology

## 2025-05-07 ENCOUNTER — Ambulatory Visit: Admitting: Student in an Organized Health Care Education/Training Program

## 2025-08-04 ENCOUNTER — Encounter (INDEPENDENT_AMBULATORY_CARE_PROVIDER_SITE_OTHER): Admitting: Ophthalmology
